# Patient Record
Sex: Female | Born: 1955
Health system: Southern US, Community
[De-identification: ages and names within clinical notes are randomized; demographics above are authoritative.]

## PROBLEM LIST (undated history)

## (undated) DIAGNOSIS — E119 Type 2 diabetes mellitus without complications: Secondary | ICD-10-CM

## (undated) DIAGNOSIS — Z9889 Other specified postprocedural states: Secondary | ICD-10-CM

## (undated) DIAGNOSIS — M2550 Pain in unspecified joint: Secondary | ICD-10-CM

## (undated) DIAGNOSIS — R5383 Other fatigue: Secondary | ICD-10-CM

## (undated) DIAGNOSIS — R112 Nausea with vomiting, unspecified: Secondary | ICD-10-CM

## (undated) DIAGNOSIS — E0590 Thyrotoxicosis, unspecified without thyrotoxic crisis or storm: Secondary | ICD-10-CM

## (undated) DIAGNOSIS — M1990 Unspecified osteoarthritis, unspecified site: Secondary | ICD-10-CM

## (undated) DIAGNOSIS — Z872 Personal history of diseases of the skin and subcutaneous tissue: Secondary | ICD-10-CM

## (undated) DIAGNOSIS — K219 Gastro-esophageal reflux disease without esophagitis: Secondary | ICD-10-CM

## (undated) DIAGNOSIS — I10 Essential (primary) hypertension: Secondary | ICD-10-CM

## (undated) HISTORY — PX: ROTATOR CUFF REPAIR: SHX139

## (undated) HISTORY — DX: Unspecified osteoarthritis, unspecified site: M19.90

## (undated) HISTORY — DX: Other fatigue: R53.83

## (undated) HISTORY — PX: TUBAL LIGATION: SHX77

## (undated) HISTORY — DX: Thyrotoxicosis, unspecified without thyrotoxic crisis or storm: E05.90

## (undated) HISTORY — DX: Personal history of diseases of the skin and subcutaneous tissue: Z87.2

## (undated) HISTORY — DX: Pain in unspecified joint: M25.50

---

## 2004-07-03 ENCOUNTER — Emergency Department: Payer: Self-pay | Admitting: Emergency Medicine

## 2004-07-23 ENCOUNTER — Ambulatory Visit: Payer: Self-pay

## 2005-01-01 ENCOUNTER — Emergency Department: Payer: Self-pay | Admitting: Emergency Medicine

## 2007-06-26 ENCOUNTER — Ambulatory Visit: Payer: Self-pay | Admitting: Family Medicine

## 2007-11-20 ENCOUNTER — Ambulatory Visit: Payer: Self-pay | Admitting: Family Medicine

## 2008-09-19 ENCOUNTER — Ambulatory Visit: Payer: Self-pay | Admitting: Family Medicine

## 2011-11-08 LAB — HM PAP SMEAR: HM PAP: NEGATIVE

## 2012-06-05 ENCOUNTER — Emergency Department: Payer: Self-pay | Admitting: Emergency Medicine

## 2013-12-01 ENCOUNTER — Emergency Department: Payer: Self-pay | Admitting: Emergency Medicine

## 2013-12-01 LAB — BASIC METABOLIC PANEL
ANION GAP: 4 — AB (ref 7–16)
BUN: 12 mg/dL (ref 7–18)
CALCIUM: 8.7 mg/dL (ref 8.5–10.1)
CHLORIDE: 108 mmol/L — AB (ref 98–107)
CO2: 28 mmol/L (ref 21–32)
Creatinine: 0.82 mg/dL (ref 0.60–1.30)
EGFR (African American): >60
EGFR (Non-African Amer.): >60
Glucose: 126 mg/dL — ABNORMAL HIGH (ref 65–99)
Osmolality: 281 (ref 275–301)
Potassium: 3.3 mmol/L — ABNORMAL LOW (ref 3.5–5.1)
SODIUM: 140 mmol/L (ref 136–145)

## 2013-12-01 LAB — CBC WITH DIFFERENTIAL/PLATELET
BASOS PCT: 0.9 %
Basophil #: 0.1 10*3/uL (ref 0.0–0.1)
EOS PCT: 2 %
Eosinophil #: 0.2 10*3/uL (ref 0.0–0.7)
HCT: 40.4 % (ref 35.0–47.0)
HGB: 13.8 g/dL (ref 12.0–16.0)
Lymphocyte #: 3 10*3/uL (ref 1.0–3.6)
Lymphocyte %: 28.2 %
MCH: 32.1 pg (ref 26.0–34.0)
MCHC: 34.1 g/dL (ref 32.0–36.0)
MCV: 94 fL (ref 80–100)
MONO ABS: 0.8 x10 3/mm (ref 0.2–0.9)
Monocyte %: 7.6 %
NEUTROS ABS: 6.6 10*3/uL — AB (ref 1.4–6.5)
Neutrophil %: 61.3 %
Platelet: 373 10*3/uL (ref 150–440)
RBC: 4.29 10*6/uL (ref 3.80–5.20)
RDW: 12.6 % (ref 11.5–14.5)
WBC: 10.8 10*3/uL (ref 3.6–11.0)

## 2013-12-01 LAB — URINALYSIS, COMPLETE
BACTERIA: NONE SEEN
BILIRUBIN, UR: NEGATIVE
Blood: NEGATIVE
GLUCOSE, UR: NEGATIVE mg/dL (ref 0–75)
KETONE: NEGATIVE
Leukocyte Esterase: NEGATIVE
Nitrite: NEGATIVE
PROTEIN: NEGATIVE
Ph: 5 (ref 4.5–8.0)
Specific Gravity: 1.011 (ref 1.003–1.030)
Squamous Epithelial: NONE SEEN
WBC UR: 3 /HPF (ref 0–5)

## 2013-12-01 LAB — TROPONIN I: Troponin-I: <0.02 ng/mL

## 2014-09-05 ENCOUNTER — Ambulatory Visit: Payer: Self-pay | Admitting: Specialist

## 2015-04-09 ENCOUNTER — Telehealth: Payer: Self-pay | Admitting: Family Medicine

## 2015-04-09 ENCOUNTER — Encounter: Payer: Self-pay | Admitting: Family Medicine

## 2015-04-09 ENCOUNTER — Ambulatory Visit: Payer: Self-pay | Admitting: Family Medicine

## 2015-04-09 ENCOUNTER — Ambulatory Visit (INDEPENDENT_AMBULATORY_CARE_PROVIDER_SITE_OTHER): Payer: BLUE CROSS/BLUE SHIELD | Admitting: Family Medicine

## 2015-04-09 VITALS — BP 124/78 | HR 64 | Temp 98.3°F | Resp 16 | Ht 65.0 in | Wt 196.0 lb

## 2015-04-09 DIAGNOSIS — M1990 Unspecified osteoarthritis, unspecified site: Secondary | ICD-10-CM | POA: Insufficient documentation

## 2015-04-09 DIAGNOSIS — I10 Essential (primary) hypertension: Secondary | ICD-10-CM | POA: Insufficient documentation

## 2015-04-09 DIAGNOSIS — R5383 Other fatigue: Secondary | ICD-10-CM | POA: Diagnosis not present

## 2015-04-09 DIAGNOSIS — I152 Hypertension secondary to endocrine disorders: Secondary | ICD-10-CM | POA: Insufficient documentation

## 2015-04-09 DIAGNOSIS — R609 Edema, unspecified: Secondary | ICD-10-CM | POA: Insufficient documentation

## 2015-04-09 DIAGNOSIS — Z872 Personal history of diseases of the skin and subcutaneous tissue: Secondary | ICD-10-CM | POA: Insufficient documentation

## 2015-04-09 DIAGNOSIS — G8929 Other chronic pain: Secondary | ICD-10-CM

## 2015-04-09 DIAGNOSIS — E0590 Thyrotoxicosis, unspecified without thyrotoxic crisis or storm: Secondary | ICD-10-CM | POA: Insufficient documentation

## 2015-04-09 DIAGNOSIS — E1159 Type 2 diabetes mellitus with other circulatory complications: Secondary | ICD-10-CM | POA: Insufficient documentation

## 2015-04-09 MED ORDER — VENLAFAXINE HCL ER 75 MG PO CP24: 75.0000 mg | ORAL_CAPSULE | Freq: Every day | ORAL | Status: DC

## 2015-04-09 NOTE — Progress Notes (Signed)
Subjective:    Patient ID: [name], female    DOB: 24-May-1956, 59 y.o.   MRN: [id number]  Muscle Pain The current episode started more than 1 year ago. The problem has been gradually worsening since onset. Associated with: pt's job, does a lot of repetive motion.  The pain is present in the neck, left shoulder, right shoulder, right elbow, left elbow and lower back. The pain is severe. Associated symptoms include fatigue, nausea, stiffness and weakness. Pertinent negatives include no abdominal pain, chest pain, constipation, diarrhea, dysuria, eye pain, fever, headaches, joint swelling, rash, shortness of breath, urinary symptoms, vaginal discharge, visual change, vomiting or wheezing. Treatments tried: [employer]. The treatment provided mild relief.  Back Pain The current episode started more than 1 month ago. The problem has been gradually worsening since onset. The pain is present in the lumbar spine. The pain radiates to the right thigh. The pain is at a severity of 10/10. The pain is severe. The pain is the same all the time. The symptoms are aggravated by standing. Associated symptoms include leg pain and weakness. Pertinent negatives include no abdominal pain, chest pain, dysuria, fever, headaches, numbness or paresthesias.      Review of Systems  Constitutional: Positive for fatigue. Negative for fever.  HENT:       Eye Itching is Present  Eyes: Negative for pain.  Respiratory: Negative for shortness of breath and wheezing.   Cardiovascular: Negative for chest pain.  Gastrointestinal: Positive for nausea. Negative for vomiting, abdominal pain, diarrhea and constipation.  Genitourinary: Negative for dysuria and vaginal discharge.  Musculoskeletal: Positive for back pain and stiffness. Negative for joint swelling.  Skin: Negative for rash.  Neurological: Positive for weakness. Negative for numbness, headaches and paresthesias.   BP 124/78 mmHg  Pulse 64  Temp(Src) 98.3 F  (36.8 C) (Oral)  Resp 16  Ht 5\' 5"  (1.651 m)  Wt 196 lb (88.905 kg)  BMI 32.62 kg/m2   Patient Active Problem List   Diagnosis Date Noted  . Arthritis sicca 04/09/2015  . Accumulation of fluid in tissues 04/09/2015  . Blood pressure elevated without history of HTN 04/09/2015  . H/O eczema 04/09/2015  . Hyperthyroidism 04/09/2015   No past medical history on file. No current outpatient prescriptions on file prior to visit.   No current facility-administered medications on file prior to visit.   No Known Allergies Past Surgical History  Procedure Laterality Date  . Rotator cuff repair Right   . Tubal ligation     History   Social History  . Marital Status: Married    Spouse Name: N/A  . Number of Children: 4  . Years of Education: GED   Occupational History  .  [employer]   Social History Main Topics  . Smoking status: Never Smoker   . Smokeless tobacco: Never Used  . Alcohol Use: Yes     Comment: Occasional  . Drug Use: No  . Sexual Activity: Not on file   Other Topics Concern  . Not on file   Social History Narrative   Family History  Problem Relation Age of Onset  . CAD Mother   . Throat cancer Mother   . Hypertension Father   . Lung cancer Father   . Diabetes Maternal Grandmother   . Diabetes Paternal Grandfather       Objective:   Physical Exam  Constitutional: She is oriented to person, place, and time. She appears well-developed and well-nourished.  Neurological: She is alert and oriented to person, place, and time. She has normal reflexes.  Psychiatric: She has a normal mood and affect. Her behavior is normal. Judgment and thought content normal.  Tearful regarding pain.     BP 124/78 mmHg  Pulse 64  Temp(Src) 98.3 F (36.8 C) (Oral)  Resp 16  Ht 5\' 5"  (1.651 m)  Wt 196 lb (88.905 kg)  BMI 32.62 kg/m2       Assessment & Plan:  1. Arthritis Will recheck labs.   - CBC with Differential/Platelet - B. Burgdorfi Antibodies -  ANA - Rheumatoid Arthritis Profile - Cyclic Citrul Peptide Antibody, IGG - Sedimentation rate - Comprehensive metabolic panel  2. Other fatigue Concerning for fibromyalgia.  Will check labs. Trial of Effexor. Further plan pending these results.  - TSH - venlafaxine XR (EFFEXOR-XR) 75 MG 24 hr capsule; Take 1 capsule (75 mg total) by mouth daily with breakfast.  Dispense: 30 capsule; Refill: 5  3. Chronic pain Worsening.  Will try medication as below. Use Ultram very sparingly.  Will stop Effexor if does not help. Recheck in 4 weeks.  - venlafaxine XR (EFFEXOR-XR) 75 MG 24 hr capsule; Take 1 capsule (75 mg total) by mouth daily with breakfast.  Dispense: 30 capsule; Refill: 5   Margarita Rana, MD

## 2015-04-09 NOTE — Telephone Encounter (Signed)
Pt is asking about taking the Rx traMADol (ULTRAM) 50 MG  And the Rx venlafaxine XR (EFFEXOR-XR) 75 MG together?  QN#[phone]/FF

## 2015-04-09 NOTE — Telephone Encounter (Signed)
Ok to use Ultram sparingly.

## 2015-04-10 ENCOUNTER — Emergency Department
Admission: EM | Admit: 2015-04-10 | Discharge: 2015-04-10 | Disposition: A | Discharge status: Home / Self Care | Payer: BLUE CROSS/BLUE SHIELD | Attending: Emergency Medicine | Admitting: Emergency Medicine

## 2015-04-10 ENCOUNTER — Other Ambulatory Visit: Payer: Self-pay

## 2015-04-10 ENCOUNTER — Telehealth: Payer: Self-pay

## 2015-04-10 ENCOUNTER — Encounter: Payer: Self-pay | Admitting: Emergency Medicine

## 2015-04-10 DIAGNOSIS — Z79899 Other long term (current) drug therapy: Secondary | ICD-10-CM | POA: Insufficient documentation

## 2015-04-10 DIAGNOSIS — T44905A Adverse effect of unspecified drugs primarily affecting the autonomic nervous system, initial encounter: Secondary | ICD-10-CM | POA: Diagnosis not present

## 2015-04-10 DIAGNOSIS — Y9389 Activity, other specified: Secondary | ICD-10-CM | POA: Insufficient documentation

## 2015-04-10 DIAGNOSIS — Y998 Other external cause status: Secondary | ICD-10-CM | POA: Insufficient documentation

## 2015-04-10 DIAGNOSIS — R002 Palpitations: Secondary | ICD-10-CM | POA: Insufficient documentation

## 2015-04-10 DIAGNOSIS — T7840XA Allergy, unspecified, initial encounter: Secondary | ICD-10-CM | POA: Diagnosis present

## 2015-04-10 DIAGNOSIS — M1990 Unspecified osteoarthritis, unspecified site: Secondary | ICD-10-CM

## 2015-04-10 DIAGNOSIS — X58XXXA Exposure to other specified factors, initial encounter: Secondary | ICD-10-CM | POA: Diagnosis not present

## 2015-04-10 DIAGNOSIS — Y9289 Other specified places as the place of occurrence of the external cause: Secondary | ICD-10-CM | POA: Diagnosis not present

## 2015-04-10 DIAGNOSIS — T50905A Adverse effect of unspecified drugs, medicaments and biological substances, initial encounter: Secondary | ICD-10-CM

## 2015-04-10 DIAGNOSIS — M2550 Pain in unspecified joint: Secondary | ICD-10-CM

## 2015-04-10 LAB — BASIC METABOLIC PANEL
ANION GAP: 7 (ref 5–15)
BUN: 18 mg/dL (ref 6–20)
CHLORIDE: 106 mmol/L (ref 101–111)
CO2: 28 mmol/L (ref 22–32)
CREATININE: 0.76 mg/dL (ref 0.44–1.00)
Calcium: 9.4 mg/dL (ref 8.9–10.3)
Glucose, Bld: 175 mg/dL — ABNORMAL HIGH (ref 65–99)
Potassium: 3.3 mmol/L — ABNORMAL LOW (ref 3.5–5.1)
Sodium: 141 mmol/L (ref 135–145)

## 2015-04-10 LAB — COMPREHENSIVE METABOLIC PANEL
ALT: 28 IU/L (ref 0–32)
AST: 28 IU/L (ref 0–40)
Albumin/Globulin Ratio: 1.9 (ref 1.1–2.5)
Albumin: 4.6 g/dL (ref 3.5–5.5)
Alkaline Phosphatase: 78 IU/L (ref 39–117)
BUN/Creatinine Ratio: 18 (ref 9–23)
BUN: 13 mg/dL (ref 6–24)
Bilirubin Total: 0.2 mg/dL (ref 0.0–1.2)
CALCIUM: 9.5 mg/dL (ref 8.7–10.2)
CO2: 25 mmol/L (ref 18–29)
Chloride: 102 mmol/L (ref 97–108)
Creatinine, Ser: 0.71 mg/dL (ref 0.57–1.00)
GFR calc Af Amer: 109 mL/min/{1.73_m2} (ref >59)
GFR, EST NON AFRICAN AMERICAN: 94 mL/min/{1.73_m2} (ref >59)
GLUCOSE: 96 mg/dL (ref 65–99)
Globulin, Total: 2.4 g/dL (ref 1.5–4.5)
Potassium: 4.4 mmol/L (ref 3.5–5.2)
SODIUM: 143 mmol/L (ref 134–144)
Total Protein: 7 g/dL (ref 6.0–8.5)

## 2015-04-10 LAB — CBC WITH DIFFERENTIAL/PLATELET
BASOS ABS: 0.1 10*3/uL (ref 0.0–0.2)
BASOS: 1 %
EOS (ABSOLUTE): 0.3 10*3/uL (ref 0.0–0.4)
Eos: 3 %
HEMATOCRIT: 40.3 % (ref 34.0–46.6)
HEMOGLOBIN: 13.6 g/dL (ref 11.1–15.9)
IMMATURE GRANULOCYTES: 0 %
Immature Grans (Abs): 0 10*3/uL (ref 0.0–0.1)
Lymphocytes Absolute: 3.3 10*3/uL — ABNORMAL HIGH (ref 0.7–3.1)
Lymphs: 36 %
MCH: 30.7 pg (ref 26.6–33.0)
MCHC: 33.7 g/dL (ref 31.5–35.7)
MCV: 91 fL (ref 79–97)
Monocytes Absolute: 0.8 10*3/uL (ref 0.1–0.9)
Monocytes: 8 %
Neutrophils Absolute: 4.9 10*3/uL (ref 1.4–7.0)
Neutrophils: 52 %
PLATELETS: 392 10*3/uL — AB (ref 150–379)
RBC: 4.43 x10E6/uL (ref 3.77–5.28)
RDW: 13.5 % (ref 12.3–15.4)
WBC: 9.3 10*3/uL (ref 3.4–10.8)

## 2015-04-10 LAB — CBC
HCT: 41.1 % (ref 35.0–47.0)
Hemoglobin: 13.6 g/dL (ref 12.0–16.0)
MCH: 31 pg (ref 26.0–34.0)
MCHC: 33.2 g/dL (ref 32.0–36.0)
MCV: 93.4 fL (ref 80.0–100.0)
PLATELETS: 348 10*3/uL (ref 150–440)
RBC: 4.4 MIL/uL (ref 3.80–5.20)
RDW: 13 % (ref 11.5–14.5)
WBC: 9.7 10*3/uL (ref 3.6–11.0)

## 2015-04-10 LAB — TSH: TSH: 4.7 u[IU]/mL — AB (ref 0.450–4.500)

## 2015-04-10 LAB — TROPONIN I: Troponin I: <0.03 ng/mL (ref <0.031)

## 2015-04-10 LAB — B. BURGDORFI ANTIBODIES

## 2015-04-10 LAB — MAGNESIUM: MAGNESIUM: 2 mg/dL (ref 1.7–2.4)

## 2015-04-10 LAB — SEDIMENTATION RATE: SED RATE: 2 mm/h (ref 0–40)

## 2015-04-10 LAB — ANA: Anti Nuclear Antibody(ANA): NEGATIVE

## 2015-04-10 MED ORDER — ONDANSETRON HCL 4 MG/2ML IJ SOLN: 4.0000 mg | INTRAMUSCULAR | Status: DC

## 2015-04-10 MED ORDER — SODIUM CHLORIDE 0.9 % IV BOLUS (SEPSIS)
1000.0000 mL | Freq: Once | INTRAVENOUS | Status: AC
  Administered 2015-04-10: 1000 mL via INTRAVENOUS

## 2015-04-10 MED ORDER — LORAZEPAM 2 MG/ML IJ SOLN
1.0000 mg | Freq: Once | INTRAMUSCULAR | Status: AC
  Administered 2015-04-10: 1 mg via INTRAVENOUS
  Filled 2015-04-10: qty 1

## 2015-04-10 MED ORDER — LORAZEPAM 2 MG/ML IJ SOLN: 1.0000 mg | Freq: Once | INTRAMUSCULAR | Status: DC

## 2015-04-10 MED ORDER — POTASSIUM CHLORIDE CRYS ER 20 MEQ PO TBCR
40.0000 meq | EXTENDED_RELEASE_TABLET | Freq: Once | ORAL | Status: AC
Start: 2015-04-10 — End: 2015-04-10
  Administered 2015-04-10: 40 meq via ORAL
  Filled 2015-04-10: qty 2

## 2015-04-10 NOTE — Telephone Encounter (Signed)
Pt advised as directed below. She would like to proceed with the referral to Dr. Sharlet Salina.   Thanks,   -Mickel Baas

## 2015-04-10 NOTE — Discharge Instructions (Signed)
Drug Allergy  Please do not take venlafaxine ever again and take great caution if your doctor considers prescribing UA medication in the class of selective serotonin and norepinephrine reuptake inhibitors.  Please follow-up with Dr. Venia Minks tomorrow. Return to the emergency room right away should your symptoms come back, you have a fever, weakness, pass out, get abdominal pain, experience chest pain, or other new concerns arise.    A drug allergy means you have a strange reaction to a medicine. You may have puffiness (swelling), itching, red rashes, and hives. Some allergic reactions can be life-threatening. HOME CARE  If you do not know what caused your reaction:  Write down medicines you use.  Write down any problems you have after using medicine.  Avoid things that cause a reaction.  You can see an allergy doctor to be tested for allergies. If you have hives or a rash:  Take medicine as told by your doctor.  Place cold cloths on your skin.  Do not take hot baths or hot showers. Take baths in cool water. If you are severely allergic:  Wear a medical bracelet or necklace that lists your allergy.  Carry your allergy kit or medicine shot to treat severe allergic reactions with you. These can save your life.  Do not drive until medicine from your shot has worn off, unless your doctor says it is okay. GET HELP RIGHT AWAY IF:   Your mouth is puffy, or you have trouble breathing.  You have a tight feeling in your chest or throat.  You have hives, puffiness, or itching all over your body.  You throw up (vomit) or have watery poop (diarrhea).  You feel dizzy or pass out (faint).  You think you are having a reaction. Problems often start within 30 minutes after taking a medicine.  You are getting worse, not better.  You have new problems.  Your problems go away and then come back. This is an emergency. Use your medicine shot or allergy kit as told. Call yourlocal emergency  services (911 in [place] after the shot. Even if you feel better after the shot, you need to go to the hospital. You may need more medicine to control a severe reaction. MAKE SURE YOU:  Understand these instructions.  Will watch your condition.  Will get help right away if you are not doing well or get worse. Document Released: 10/07/2004 Document Revised: 11/22/2011 Document Reviewed: 02/25/2011 [facility] Patient Information 2015 [place], [place]. This information is not intended to replace advice given to you by your health care provider. Make sure you discuss any questions you have with your health care provider.

## 2015-04-10 NOTE — Telephone Encounter (Signed)
-----   Message from Margarita Rana, MD sent at 04/10/2015  1:43 PM EDT ----- Lyme titer and ANA negative. Can refer to Dr. Sharlet Salina for further evaluation of pain if patient would like. Thanks.

## 2015-04-10 NOTE — ED Provider Notes (Signed)
[facility] Emergency Department Provider Note  ____________________________________________  Time seen: Approximately 12:30 AM  I have reviewed the triage vital signs and the nursing notes.   HISTORY  Chief Complaint Allergic Reaction    HPI Molly Potter is a 59 y.o. female comes in after waking up around 11 PM feeling very shaky, tremulous, having a pounding headache, sweats, and a racing heart. She states she believes she is having allergic reaction to a new medicine which she took just prior to falling asleep at about 3 PM. She states that she's been having some chronic muscle aches across her upper back and shoulders  She denies any fevers or chills. She was sweaty earlier. No recent illness. No abdominal pain. She has chronic pain across her shoulders and back muscles. No difficulty with urination. She is not pregnant.  History reviewed. No pertinent past medical history.  Patient Active Problem List   Diagnosis Date Noted  . Arthritis sicca 04/09/2015  . Accumulation of fluid in tissues 04/09/2015  . Blood pressure elevated without history of HTN 04/09/2015  . H/O eczema 04/09/2015  . Hyperthyroidism 04/09/2015  . Arthritis 04/09/2015  . Fatigue 04/09/2015    Past Surgical History  Procedure Laterality Date  . Rotator cuff repair Right   . Tubal ligation      Current Outpatient Rx  Name  Route  Sig  Dispense  Refill  . traMADol (ULTRAM) 50 MG tablet   Oral   Take by mouth.         . venlafaxine XR (EFFEXOR-XR) 75 MG 24 hr capsule   Oral   Take 1 capsule (75 mg total) by mouth daily with breakfast.   30 capsule   5   . acetaminophen (TYLENOL) 500 MG tablet   Oral   Take 1,000 mg by mouth every 6 (six) hours as needed.         . diphenhydrAMINE (SOMINEX) 25 MG tablet   Oral   Take 25 mg by mouth at bedtime as needed for sleep.         . meloxicam (MOBIC) 15 MG tablet            1   . ranitidine (ZANTAC) 150 MG  tablet   Oral   Take by mouth.           Allergies Review of patient's allergies indicates no known allergies.  Family History  Problem Relation Age of Onset  . CAD Mother   . Throat cancer Mother   . Hypertension Father   . Lung cancer Father   . Diabetes Maternal Grandmother   . Diabetes Paternal Grandfather     Social History History  Substance Use Topics  . Smoking status: Never Smoker   . Smokeless tobacco: Never Used  . Alcohol Use: Yes     Comment: Occasional    Review of Systems Constitutional: No fever/chills Eyes: No visual changes. ENT: No sore throat. Cardiovascular: Denies chest pain. Asked twice, definitely no chest pain. Respiratory: Denies shortness of breath. Gastrointestinal: No abdominal pain.    No diarrhea.  No constipation. Genitourinary: Negative for dysuria. Musculoskeletal: Negative for back pain. Skin: Negative for rash. Neurological: Negative for headaches, focal weakness or numbness.  No thoughts of hurting herself. No hallucinations.  10-point ROS otherwise negative.  ____________________________________________   PHYSICAL EXAM:  VITAL SIGNS: ED Triage Vitals  Enc Vitals Group     BP 04/10/15 0009 183/97 mmHg     Pulse Rate 04/10/15  0009 142     Resp 04/10/15 0009 22     Temp 04/10/15 0009 97.8 F (36.6 C)     Temp Source 04/10/15 0009 Oral     SpO2 04/10/15 0009 97 %     Weight --      Height --      Head Cir --      Peak Flow --      Pain Score 04/10/15 0009 10     Pain Loc --      Pain Edu? --      Excl. in [place]? --     Constitutional: Alert and oriented. Well appearing slightly anxious Eyes: Conjunctivae are normal. PERRL. EOMI. Head: Atraumatic. Nose: No congestion/rhinnorhea. Mouth/Throat: Mucous membranes are slightly dry.  Oropharynx non-erythematous. Neck: No stridor.   Cardiovascular: Tachycardia regular rhythm. Grossly normal heart sounds.  Good peripheral circulation. Respiratory: Normal respiratory  effort.  No retractions. Lungs CTAB. Gastrointestinal: Soft and nontender. No distention. No abdominal bruits. No CVA tenderness. Musculoskeletal: No lower extremity tenderness nor edema.  No joint effusions. Neurologic:  Normal speech and language. No gross focal neurologic deficits are appreciated. No gait instability. Normal reflexes. No facial droop. Moves all extremities well. No clonus. Skin:  Skin is warm, dry slightly cool having recently been diaphoretic. No rash noted. Psychiatric: Mood and affect are normal. Speech and behavior are normal.  ____________________________________________   LABS (all labs ordered are listed, but only abnormal results are displayed)  Labs Reviewed  BASIC METABOLIC PANEL - Abnormal; Notable for the following:    Potassium 3.3 (*)    Glucose, Bld 175 (*)    All other components within normal limits  CBC  MAGNESIUM  TROPONIN I   ____________________________________________  EKG  Reviewed and interpreted by me Sinus tachycardia at a rate of 135 PR 134 QRS 84 QTc 475 No significant ST abnormalities noted.  Interpreted as sinus tachycardia with slightly prolonged QT. ____________________________________________  RADIOLOGY   ____________________________________________   PROCEDURES  Procedure(s) performed: None  Critical Care performed: No  ____________________________________________   INITIAL IMPRESSION / ASSESSMENT AND PLAN / ED COURSE  Pertinent labs & imaging results that were available during my care of the patient were reviewed by me and considered in my medical decision making (see chart for details).  Patient present with symptoms of hyperactivity potentially sympathomimetics-type toxidrome versus anticholinergic. She denies any acute infectious symptoms and not having any concerning symptoms like chest pain. This is almost setting of recently taking venlafaxine xr. I reviewed and discussed with CJ at poison control and  appears consistent with venlafaxine adverse reaction. We will treat her with conservative management and supportive care. Poison control recommends observation until all symptoms have improved, once improved the patient could be discharged home to not ever take venlafaxine again, which she acknowledges that she will never take. Other considerations would be acute metabolic abnormalities or infectious etiologies, but she is not febrile and denies any infectious symptoms. Her abdominal exam is really reassuring. I suspect this is likely a drug related side effect.  ----------------------------------------- 1:54 AM on 04/10/2015 -----------------------------------------  Patient reports asymptomatic now. Nausea tremulousness and anxiety are gone. No symptoms. Vital signs are much improved. Potassium slightly low and we will replete this. I discussed with the patient that we will continue to observe her briefly and obtain a second EKG. Should she continue to feel well and her vital signs remained stable, which I anticipate they will we'll plan to discharge her to home. Careful  return precautions advised as well as to never retake venlafaxine or medications of that similar class, patient very agreeable and will contact her doctor tomorrow to discuss.  ----------------------------------------- 2:28 AM on 04/10/2015 -----------------------------------------  Patient remains asymptomatic. Repeat EKG done at 2:15 AM.  ED ECG REPORT I, QUALE, MARK, the attending physician, personally viewed and interpreted this ECG.  Date: 04/10/2015 EKG Time: 2:15 Rate: 85 Rhythm: normal sinus rhythm QRS Axis: normal Intervals: normal ST/T Wave abnormalities: normal Conduction Disutrbances: none Narrative Interpretation: unremarkable, QTC has normalized  Careful return precautions discussed with the patient. She'll follow-up with her doctor tomorrow.   ____________________________________________   FINAL  CLINICAL IMPRESSION(S) / ED DIAGNOSES  Final diagnoses:  Medication side effect, initial encounter  Sympathomimetic adverse reaction, initial encounter      Delman Kitten, MD 04/10/15 [phone]

## 2015-04-10 NOTE — ED Notes (Addendum)
Patient ambulatory to triage with steady gait, without difficulty or distress noted; pt reports taking antidepressant today and now "feel like I'm on fire on the inside, like my head is gonna explode"; pt tachycardic, diaphoretic and actively vomiting

## 2015-04-11 LAB — RHEUMATOID ARTHRITIS PROFILE: Cyclic Citrullin Peptide Ab: 13 units (ref 0–19)

## 2015-05-07 ENCOUNTER — Ambulatory Visit: Payer: BLUE CROSS/BLUE SHIELD | Admitting: Family Medicine

## 2015-05-28 ENCOUNTER — Ambulatory Visit (INDEPENDENT_AMBULATORY_CARE_PROVIDER_SITE_OTHER): Payer: BLUE CROSS/BLUE SHIELD | Admitting: Family Medicine

## 2015-05-28 ENCOUNTER — Encounter: Payer: Self-pay | Admitting: Family Medicine

## 2015-05-28 VITALS — BP 118/78 | HR 71 | Temp 98.2°F | Resp 16 | Ht 65.0 in | Wt 200.0 lb

## 2015-05-28 DIAGNOSIS — R002 Palpitations: Secondary | ICD-10-CM

## 2015-05-28 NOTE — Progress Notes (Signed)
Subjective:     Patient ID: Molly Potter, female   DOB: [birth year], 59 y.o.   MRN: [id number]  HPI  Chief Complaint  Patient presents with  . Palpitations    Patient comes in office today with concerns of heart palpitations, patient states "my heart feels like its gonna come out of my chest. Patient reports that palpitations began a few days ago, she states that she just finished a round of steroids and has had little to no sleep and is unsure if it is related  Completed a 6 day course of prednisone per pain management on 9/11. During this taper she developed episodes of forceful palpitations several x day for minutes at a time. No associated chest pain. Had previous ER visit in July for palpitations attributed to a medication. Reports minimal use of caffeine (one drink daily). Hx of hyperthyroidism with recent mild TSH elevation.   Review of Systems  Psychiatric/Behavioral:       Denies panic attacks.       Objective:   Physical Exam  Constitutional: She appears well-developed and well-nourished. No distress.  Cardiovascular: Normal rate and regular rhythm.   No murmur heard. Pulmonary/Chest: Effort normal and breath sounds normal.  Musculoskeletal: She exhibits no edema (of lower extremties).       Assessment:    1. Heart palpitations - EKG 12-Lead: NSR - Renal function panel - T4, free - TSH    Plan:    Further f/u pending lab results. Consider Holter monitor if palpitations do not abate spontaneously.

## 2015-05-28 NOTE — Patient Instructions (Addendum)
We will call you with the lab results. Minimize caffeine intake.

## 2015-05-29 ENCOUNTER — Telehealth: Payer: Self-pay | Admitting: Family Medicine

## 2015-05-29 LAB — RENAL FUNCTION PANEL
Albumin: 4.1 g/dL (ref 3.5–5.5)
BUN/Creatinine Ratio: 16 (ref 9–23)
BUN: 12 mg/dL (ref 6–24)
CO2: 27 mmol/L (ref 18–29)
Calcium: 9.6 mg/dL (ref 8.7–10.2)
Chloride: 102 mmol/L (ref 97–108)
Creatinine, Ser: 0.76 mg/dL (ref 0.57–1.00)
GFR, EST AFRICAN AMERICAN: 100 mL/min/{1.73_m2} (ref >59)
GFR, EST NON AFRICAN AMERICAN: 87 mL/min/{1.73_m2} (ref >59)
GLUCOSE: 77 mg/dL (ref 65–99)
PHOSPHORUS: 3.9 mg/dL (ref 2.5–4.5)
POTASSIUM: 4.4 mmol/L (ref 3.5–5.2)
SODIUM: 145 mmol/L — AB (ref 134–144)

## 2015-05-29 LAB — TSH: TSH: 3.41 u[IU]/mL (ref 0.450–4.500)

## 2015-05-29 LAB — T4, FREE: Free T4: 1.1 ng/dL (ref 0.82–1.77)

## 2015-05-29 NOTE — Telephone Encounter (Signed)
Labs are normal. I have discussed her case with Dr. Venia Minks. If still having palpitations we will send her to a cardiologist.

## 2015-05-29 NOTE — Telephone Encounter (Signed)
Did you call patient about her lab results?

## 2015-05-29 NOTE — Telephone Encounter (Signed)
Pt returned Bob's call regarding her lab work.  Thanks [employer]

## 2015-07-02 DIAGNOSIS — M7542 Impingement syndrome of left shoulder: Secondary | ICD-10-CM

## 2015-07-02 DIAGNOSIS — M7541 Impingement syndrome of right shoulder: Secondary | ICD-10-CM | POA: Insufficient documentation

## 2015-07-02 DIAGNOSIS — M5412 Radiculopathy, cervical region: Secondary | ICD-10-CM | POA: Insufficient documentation

## 2015-07-02 DIAGNOSIS — M75122 Complete rotator cuff tear or rupture of left shoulder, not specified as traumatic: Secondary | ICD-10-CM | POA: Insufficient documentation

## 2015-07-02 DIAGNOSIS — M7540 Impingement syndrome of unspecified shoulder: Secondary | ICD-10-CM | POA: Insufficient documentation

## 2015-11-26 ENCOUNTER — Encounter: Payer: Self-pay | Admitting: Physician Assistant

## 2015-11-26 ENCOUNTER — Ambulatory Visit (INDEPENDENT_AMBULATORY_CARE_PROVIDER_SITE_OTHER): Payer: 59 | Admitting: Physician Assistant

## 2015-11-26 VITALS — BP 136/76 | HR 87 | Temp 99.1°F | Resp 16 | Wt 200.4 lb

## 2015-11-26 DIAGNOSIS — K2100 Gastro-esophageal reflux disease with esophagitis, without bleeding: Secondary | ICD-10-CM

## 2015-11-26 DIAGNOSIS — J0140 Acute pansinusitis, unspecified: Secondary | ICD-10-CM | POA: Diagnosis not present

## 2015-11-26 MED ORDER — AMOXICILLIN-POT CLAVULANATE 875-125 MG PO TABS: 1.0000 | ORAL_TABLET | Freq: Two times a day (BID) | ORAL | Status: DC

## 2015-11-26 MED ORDER — ESOMEPRAZOLE MAGNESIUM 20 MG PO CPDR: 20.0000 mg | DELAYED_RELEASE_CAPSULE | Freq: Every day | ORAL | Status: DC

## 2015-11-26 NOTE — Patient Instructions (Signed)
Esomeprazole capsules What is this medicine? ESOMEPRAZOLE (es oh ME pray zol) prevents the production of acid in the stomach. It is used to treat gastroesophageal reflux disease (GERD), ulcers, certain bacteria in the stomach, and inflammation of the esophagus. It can also be used to prevent ulcers in patients taking medicines called NSAIDs. You can also buy this medicine without a prescription to treat the symptoms of heartburn. The non-prescription product is not for long-term use, unless otherwise directed by your doctor or health care professional. This medicine may be used for other purposes; ask your health care provider or pharmacist if you have questions. What should I tell my health care provider before I take this medicine? They need to know if you have any of these conditions: -bloody or black, tarry stools -chest pain -have had heartburn for over 3 months -have heartburn with dizziness, lightheadedness, or sweating -liver disease -low levels of magnesium in the blood -nausea, vomiting -stomach pain -trouble swallowing -unexplained weight loss -vomiting with blood -wheezing -an unusual or allergic reaction to esomeprazole, other medicines, foods, dyes, or preservatives -pregnant or trying to get pregnant -breast-feeding How should I use this medicine? Take this medicine by mouth. Swallow the capsules whole with a drink of water. Follow the directions on the prescription or product label. Do not crush, break or chew. The capsules can be opened and the contents sprinkled on applesauce. Do not crush the contents into the food. This medicine works best if taken on an empty stomach at least one hour before a meal. Take your medicine at regular intervals. Do not take your medicine more often than directed. Talk to your pediatrician regarding the use of this medicine in children. Special care may be needed. Overdosage: If you think you have taken too much of this medicine contact a  poison control center or emergency room at once. NOTE: This medicine is only for you. Do not share this medicine with others. What if I miss a dose? If you miss a dose, take it as soon as you can. If it is almost time for your next dose, take only that dose. Do not take double or extra doses. What may interact with this medicine? Do not take this medicine with any of the following medications: -atazanavir This medicine may also interact with the following medications: -ampicillin -antiviral medicines for HIV or AIDS -certain medicines for fungal infections like ketoconazole and itraconazole -cilostazol -clopidogrel -diazepam -digoxin -erlotinib -diuretics -iron salts -methotrexate -St. John's Wort -tacrolimus -rifampin -warfarin This list may not describe all possible interactions. Give your health care provider a list of all the medicines, herbs, non-prescription drugs, or dietary supplements you use. Also tell them if you smoke, drink alcohol, or use illegal drugs. Some items may interact with your medicine. What should I watch for while using this medicine? If you are taking this medicine without a prescription, it may take 1 to 4 days for it to fully relieve your heartburn. If you are using this medicine with a prescription from your healthcare professional for a more serious condition, it can take several days before your condition gets better. Check with your doctor or health care professional if your condition does not start to get better, or if it gets worse. If you take this medicine for long periods of time, you may need blood work done. What side effects may I notice from receiving this medicine? Side effects that you should report to your doctor or health care professional as soon as possible: -  allergic reactions like skin rash, itching or hives, swelling of the face, lips, or tongue -bone, muscle or joint pain -breathing problems -chest pain or chest tightness -dark  yellow or brown urine -fast, irregular heartbeat -feeling faint or lightheaded -fever or sore throat -muscle spasms -tremors -unusual bleeding or bruising -unusually weak or tired -upset stomach -yellowing of the eyes or skin Side effects that usually do not require medical attention (Report these to your doctor or health care professional if they continue or are bothersome.): -constipation -diarrhea -dry mouth -headache -nausea -stomach pain or gas -vomiting This list may not describe all possible side effects. Call your doctor for medical advice about side effects. You may report side effects to FDA at 1-800-FDA-1088. Where should I keep my medicine? Keep out of the reach of children. Store at room temperature between 15 and 30 degrees C (59 and 86 degrees F). Protect from light and moisture. Throw away any unused medicine after the expiration date. NOTE: This sheet is a summary. It may not cover all possible information. If you have questions about this medicine, talk to your doctor, pharmacist, or health care provider.    2016, Elsevier/Gold Standard. (2012-12-12 14:58:20) Food Choices for Gastroesophageal Reflux Disease, Adult When you have gastroesophageal reflux disease (GERD), the foods you eat and your eating habits are very important. Choosing the right foods can help ease the discomfort of GERD. WHAT GENERAL GUIDELINES DO I NEED TO FOLLOW?  Choose fruits, vegetables, whole grains, low-fat dairy products, and low-fat meat, fish, and poultry.  Limit fats such as oils, salad dressings, butter, nuts, and avocado.  Keep a food diary to identify foods that cause symptoms.  Avoid foods that cause reflux. These may be different for different people.  Eat frequent small meals instead of three large meals each day.  Eat your meals slowly, in a relaxed setting.  Limit fried foods.  Cook foods using methods other than frying.  Avoid drinking alcohol.  Avoid drinking  large amounts of liquids with your meals.  Avoid bending over or lying down until 2-3 hours after eating. WHAT FOODS ARE NOT RECOMMENDED? The following are some foods and drinks that may worsen your symptoms: Vegetables Tomatoes. Tomato juice. Tomato and spaghetti sauce. Chili peppers. Onion and garlic. Horseradish. Fruits Oranges, grapefruit, and lemon (fruit and juice). Meats High-fat meats, fish, and poultry. This includes hot dogs, ribs, ham, sausage, salami, and bacon. Dairy Whole milk and chocolate milk. Sour cream. Cream. Butter. Ice cream. Cream cheese.  Beverages Coffee and tea, with or without caffeine. Carbonated beverages or energy drinks. Condiments Hot sauce. Barbecue sauce.  Sweets/Desserts Chocolate and cocoa. Donuts. Peppermint and spearmint. Fats and Oils High-fat foods, including [place] fries and potato chips. Other Vinegar. Strong spices, such as black pepper, white pepper, red pepper, cayenne, curry powder, cloves, ginger, and chili powder. The items listed above may not be a complete list of foods and beverages to avoid. Contact your dietitian for more information.   This information is not intended to replace advice given to you by your health care provider. Make sure you discuss any questions you have with your health care provider.   Document Released: 08/30/2005 Document Revised: 09/20/2014 Document Reviewed: 07/04/2013 Elsevier Interactive Patient Education 2016 [place]. Gastroesophageal Reflux Disease, Adult Normally, food travels down the esophagus and stays in the stomach to be digested. However, when a person has gastroesophageal reflux disease (GERD), food and stomach acid move back up into the esophagus. When this happens, the esophagus  becomes sore and inflamed. Over time, GERD can create small holes (ulcers) in the lining of the esophagus.  CAUSES This condition is caused by a problem with the muscle between the esophagus and the stomach (lower  esophageal sphincter, or LES). Normally, the LES muscle closes after food passes through the esophagus to the stomach. When the LES is weakened or abnormal, it does not close properly, and that allows food and stomach acid to go back up into the esophagus. The LES can be weakened by certain dietary substances, medicines, and medical conditions, including:  Tobacco use.  Pregnancy.  Having a hiatal hernia.  Heavy alcohol use.  Certain foods and beverages, such as coffee, chocolate, onions, and peppermint. RISK FACTORS This condition is more likely to develop in:  People who have an increased body weight.  People who have connective tissue disorders.  People who use NSAID medicines. SYMPTOMS Symptoms of this condition include:  Heartburn.  Difficult or painful swallowing.  The feeling of having a lump in the throat.  Abitter taste in the mouth.  Bad breath.  Having a large amount of saliva.  Having an upset or bloated stomach.  Belching.  Chest pain.  Shortness of breath or wheezing.  Ongoing (chronic) cough or a night-time cough.  Wearing away of tooth enamel.  Weight loss. Different conditions can cause chest pain. Make sure to see your health care provider if you experience chest pain. DIAGNOSIS Your health care provider will take a medical history and perform a physical exam. To determine if you have mild or severe GERD, your health care provider may also monitor how you respond to treatment. You may also have other tests, including:  An endoscopy toexamine your stomach and esophagus with a small camera.  A test thatmeasures the acidity level in your esophagus.  A test thatmeasures how much pressure is on your esophagus.  A barium swallow or modified barium swallow to show the shape, size, and functioning of your esophagus. TREATMENT The goal of treatment is to help relieve your symptoms and to prevent complications. Treatment for this condition may  vary depending on how severe your symptoms are. Your health care provider may recommend:  Changes to your diet.  Medicine.  Surgery. HOME CARE INSTRUCTIONS Diet  Follow a diet as recommended by your health care provider. This may involve avoiding foods and drinks such as:  Coffee and tea (with or without caffeine).  Drinks that containalcohol.  Energy drinks and sports drinks.  Carbonated drinks or sodas.  Chocolate and cocoa.  Peppermint and mint flavorings.  Garlic and onions.  Horseradish.  Spicy and acidic foods, including peppers, chili powder, curry powder, vinegar, hot sauces, and barbecue sauce.  Citrus fruit juices and citrus fruits, such as oranges, lemons, and limes.  Tomato-based foods, such as red sauce, chili, salsa, and pizza with red sauce.  Fried and fatty foods, such as donuts, french fries, potato chips, and high-fat dressings.  High-fat meats, such as hot dogs and fatty cuts of red and white meats, such as rib eye steak, sausage, ham, and bacon.  High-fat dairy items, such as whole milk, butter, and cream cheese.  Eat small, frequent meals instead of large meals.  Avoid drinking large amounts of liquid with your meals.  Avoid eating meals during the 2-3 hours before bedtime.  Avoid lying down right after you eat.  Do not exercise right after you eat. General Instructions  Pay attention to any changes in your symptoms.  Take over-the-counter  and prescription medicines only as told by your health care provider. Do not take aspirin, ibuprofen, or other NSAIDs unless your health care provider told you to do so.  Do not use any tobacco products, including cigarettes, chewing tobacco, and e-cigarettes. If you need help quitting, ask your health care provider.  Wear loose-fitting clothing. Do not wear anything tight around your waist that causes pressure on your abdomen.  Raise (elevate) the head of your bed 6 inches (15cm).  Try to reduce  your stress, such as with yoga or meditation. If you need help reducing stress, ask your health care provider.  If you are overweight, reduce your weight to an amount that is healthy for you. Ask your health care provider for guidance about a safe weight loss goal.  Keep all follow-up visits as told by your health care provider. This is important. SEEK MEDICAL CARE IF:  You have new symptoms.  You have unexplained weight loss.  You have difficulty swallowing, or it hurts to swallow.  You have wheezing or a persistent cough.  Your symptoms do not improve with treatment.  You have a hoarse voice. SEEK IMMEDIATE MEDICAL CARE IF:  You have pain in your arms, neck, jaw, teeth, or back.  You feel sweaty, dizzy, or light-headed.  You have chest pain or shortness of breath.  You vomit and your vomit looks like blood or coffee grounds.  You faint.  Your stool is bloody or black.  You cannot swallow, drink, or eat.   This information is not intended to replace advice given to you by your health care provider. Make sure you discuss any questions you have with your health care provider.   Document Released: 06/09/2005 Document Revised: 05/21/2015 Document Reviewed: 12/25/2014 Elsevier Interactive Patient Education [employer].

## 2015-11-26 NOTE — Progress Notes (Signed)
Subjective:     Patient ID: Molly Potter, female   DOB: 03/28/1956, 60 y.o.   MRN: [id number]  Sinus Problem This is a new problem. The current episode started 1 to 4 weeks ago. The problem is unchanged. There has been no fever. Her pain is at a severity of 5/10. The pain is mild. Associated symptoms include coughing, headaches, a hoarse voice, neck pain (right side), sinus pressure and sneezing. Pertinent negatives include no chills, congestion, diaphoresis, ear pain, shortness of breath, sore throat or swollen glands. Past treatments include acetaminophen. The treatment provided no relief.  Abdominal Pain This is a new problem. The current episode started in the past 7 days. The onset quality is sudden. The problem occurs daily. The problem has been unchanged. The pain is located in the epigastric region. The quality of the pain is burning. The abdominal pain does not radiate. Associated symptoms include belching, flatus and headaches. Pertinent negatives include no diarrhea, dysuria, nausea, vomiting or weight loss. The pain is aggravated by eating. The pain is relieved by passing flatus. She has tried antacids for the symptoms. The treatment provided moderate relief.     Review of Systems  Constitutional: Negative for chills, weight loss and diaphoresis.  HENT: Positive for hoarse voice, sinus pressure and sneezing. Negative for congestion, ear pain and sore throat.   Respiratory: Positive for cough. Negative for shortness of breath.   Gastrointestinal: Positive for abdominal pain and flatus. Negative for nausea, vomiting and diarrhea.  Genitourinary: Negative for dysuria.  Musculoskeletal: Positive for neck pain (right side).  Neurological: Positive for headaches.    Filed Vitals:   11/26/15 1602  BP: 136/76  Pulse: 87  Temp: 99.1 F (37.3 C)  Resp: 16       Objective:   Physical Exam  Constitutional: She appears well-developed and well-nourished. No distress.  HENT:  Head:  Normocephalic and atraumatic.  Right Ear: Hearing, tympanic membrane, external ear and ear canal normal.  Left Ear: Hearing, tympanic membrane, external ear and ear canal normal.  Nose: Right sinus exhibits maxillary sinus tenderness and frontal sinus tenderness. Left sinus exhibits maxillary sinus tenderness and frontal sinus tenderness.  Mouth/Throat: Uvula is midline, oropharynx is clear and moist and mucous membranes are normal. No oropharyngeal exudate.  Neck: Normal range of motion. Neck supple. No tracheal deviation present. No thyromegaly present.  Cardiovascular: Normal rate, regular rhythm and normal heart sounds.  Exam reveals no gallop and no friction rub.   No murmur heard. Pulmonary/Chest: Effort normal and breath sounds normal. No stridor. No respiratory distress. She has no wheezes. She has no rales.  Abdominal: Soft. Bowel sounds are normal. She exhibits no distension and no mass. There is no tenderness. There is no rebound and no guarding.  Lymphadenopathy:    She has no cervical adenopathy.  Skin: She is not diaphoretic.  Vitals reviewed.      Assessment:     1. Gastroesophageal reflux disease with esophagitis   2. Acute pansinusitis, recurrence not specified        Plan:     1. Gastroesophageal reflux disease with esophagitis Seems to have worsening GERD symptoms with esophagitis.  Will change treatment from ranitidine to nexium as below.  Advised that it is possible she could be developing gastritis and esophagitis from chronic, long-term use of meloxicam.  Will treat as below and see if any improvement is made before discontinuing meloxicam. She is to call if no improvement in symptoms. - esomeprazole ([place]) 20  MG capsule; Take 1 capsule (20 mg total) by mouth daily before breakfast.  Dispense: 30 capsule; Refill: 1  2. Acute pansinusitis, recurrence not specified Worsening symptoms that has not responded to OTC medications. Will give augmentin as below. May  continue antihistamine and flonase. She is to call if symptoms fail to improve or worsen. - amoxicillin-clavulanate (AUGMENTIN) 875-125 MG tablet; Take 1 tablet by mouth 2 (two) times daily.  Dispense: 14 tablet; Refill: 0

## 2015-11-28 ENCOUNTER — Other Ambulatory Visit: Payer: Self-pay | Admitting: Physician Assistant

## 2015-11-28 DIAGNOSIS — K219 Gastro-esophageal reflux disease without esophagitis: Secondary | ICD-10-CM

## 2015-11-28 MED ORDER — PANTOPRAZOLE SODIUM 40 MG PO TBEC: 40.0000 mg | DELAYED_RELEASE_TABLET | Freq: Every day | ORAL | Status: DC

## 2016-07-15 ENCOUNTER — Encounter: Payer: Self-pay | Admitting: Emergency Medicine

## 2016-07-15 DIAGNOSIS — R002 Palpitations: Secondary | ICD-10-CM | POA: Diagnosis present

## 2016-07-15 LAB — TROPONIN I: Troponin I: <0.03 ng/mL (ref <0.03)

## 2016-07-15 LAB — CBC
HEMATOCRIT: 40.2 % (ref 35.0–47.0)
HEMOGLOBIN: 13.6 g/dL (ref 12.0–16.0)
MCH: 31.4 pg (ref 26.0–34.0)
MCHC: 33.8 g/dL (ref 32.0–36.0)
MCV: 92.9 fL (ref 80.0–100.0)
Platelets: 350 10*3/uL (ref 150–440)
RBC: 4.32 MIL/uL (ref 3.80–5.20)
RDW: 13.2 % (ref 11.5–14.5)
WBC: 11.8 10*3/uL — ABNORMAL HIGH (ref 3.6–11.0)

## 2016-07-15 LAB — BASIC METABOLIC PANEL
Anion gap: 8 (ref 5–15)
BUN: 14 mg/dL (ref 6–20)
CHLORIDE: 107 mmol/L (ref 101–111)
CO2: 26 mmol/L (ref 22–32)
CREATININE: 0.8 mg/dL (ref 0.44–1.00)
Calcium: 8.9 mg/dL (ref 8.9–10.3)
GFR calc Af Amer: >60 mL/min (ref >60)
GFR calc non Af Amer: >60 mL/min (ref >60)
Glucose, Bld: 145 mg/dL — ABNORMAL HIGH (ref 65–99)
Potassium: 3.6 mmol/L (ref 3.5–5.1)
Sodium: 141 mmol/L (ref 135–145)

## 2016-07-15 NOTE — ED Triage Notes (Signed)
Pt in with co palpitations since today and and bilat shoulder pain, some shob none noted. Pt denies any vomiting or diaphoresis, has had high bp recently.

## 2016-07-16 ENCOUNTER — Ambulatory Visit (INDEPENDENT_AMBULATORY_CARE_PROVIDER_SITE_OTHER): Payer: 59 | Admitting: Physician Assistant

## 2016-07-16 ENCOUNTER — Encounter: Payer: Self-pay | Admitting: Physician Assistant

## 2016-07-16 ENCOUNTER — Emergency Department: Payer: 59

## 2016-07-16 ENCOUNTER — Emergency Department
Admission: EM | Admit: 2016-07-16 | Discharge: 2016-07-16 | Disposition: A | Discharge status: Home / Self Care | Payer: 59 | Attending: Emergency Medicine | Admitting: Emergency Medicine

## 2016-07-16 VITALS — BP 148/80 | HR 88 | Temp 98.3°F | Resp 16 | Wt 200.2 lb

## 2016-07-16 DIAGNOSIS — R002 Palpitations: Secondary | ICD-10-CM

## 2016-07-16 DIAGNOSIS — I10 Essential (primary) hypertension: Secondary | ICD-10-CM

## 2016-07-16 LAB — TROPONIN I: Troponin I: <0.03 ng/mL (ref <0.03)

## 2016-07-16 LAB — MAGNESIUM: Magnesium: 2.2 mg/dL (ref 1.7–2.4)

## 2016-07-16 NOTE — Discharge Instructions (Signed)
Please return immediately if condition worsens. Please contact her primary physician or the physician you were given for referral. If you have any specialist physicians involved in her treatment and plan please also contact them. Thank you for using  regional emergency Department. ° °

## 2016-07-16 NOTE — Progress Notes (Signed)
Patient: Molly Potter Female    DOB: 1956-04-29   60 y.o.   MRN: [id number] Visit Date: 07/16/2016  Today's Provider: Mar Daring, PA-C   No chief complaint on file.  Subjective:    HPI  Follow Up ER Visit  Patient is here for ER follow up.  She was recently seen at [facility] for heart palpitations on 07/15/16 last night. No prodrome symptoms noted during palpitations. Treatment for this included none. Patient had an EKG done with symptom resolution upon arrival. No EKG or lab abnormalities.  She reports good compliance with treatment. She reports this condition is Improved. She has had similar episodes in the past but has been over one year ago. She had two episodes last year with the first being secondary to venlafaxine adverse reaction and the second was unknown. It was recommended at the time for her to follow up with cardiology for holter monitor but patient refused. She reports that this most recent event and her elevated BP were secondary to recently taking sudafed for an URI.  ------------------------------------------------------------------------------------     Allergies  Allergen Reactions  . Effexor [Venlafaxine] Anaphylaxis  . Diazepam     Other reaction(s): Unknown  . Tramadol Nausea Only     Current Outpatient Prescriptions:  .  acetaminophen (TYLENOL) 500 MG tablet, Take 1,000 mg by mouth every 6 (six) hours as needed., Disp: , Rfl:  .  cyclobenzaprine (FLEXERIL) 5 MG tablet, Take by mouth., Disp: , Rfl:  .  meloxicam (MOBIC) 15 MG tablet, , Disp: , Rfl: 1 .  amoxicillin-clavulanate (AUGMENTIN) 875-125 MG tablet, Take 1 tablet by mouth 2 (two) times daily. (Patient not taking: Reported on 07/16/2016), Disp: 14 tablet, Rfl: 0 .  pantoprazole (PROTONIX) 40 MG tablet, Take 1 tablet (40 mg total) by mouth daily. (Patient not taking: Reported on 07/16/2016), Disp: 30 tablet, Rfl: 1  Review of Systems  Constitutional: Negative for fatigue.  HENT:  Negative.   Respiratory: Negative for cough, chest tightness, shortness of breath and wheezing.   Cardiovascular: Positive for palpitations. Negative for chest pain and leg swelling.  Gastrointestinal: Negative.   Neurological: Negative.   Psychiatric/Behavioral: Negative.     Social History  Substance Use Topics  . Smoking status: Never Smoker  . Smokeless tobacco: Never Used  . Alcohol use Yes     Comment: Occasional   Objective:   BP (!) 148/80 (BP Location: Right Arm, Patient Position: Sitting, Cuff Size: Normal)   Pulse 88   Temp 98.3 F (36.8 C) (Oral)   Resp 16   Wt 200 lb 3.2 oz (90.8 kg)   SpO2 97%   BMI 33.32 kg/m   Physical Exam  Constitutional: She appears well-developed and well-nourished. No distress.  Neck: Normal range of motion. Neck supple. No JVD present. No tracheal deviation present. No thyromegaly present.  Cardiovascular: Normal rate, regular rhythm and normal heart sounds.  Exam reveals no gallop and no friction rub.   No murmur heard. Pulmonary/Chest: Effort normal and breath sounds normal. No respiratory distress. She has no wheezes. She has no rales.  Musculoskeletal: She exhibits no edema.  Lymphadenopathy:    She has no cervical adenopathy.  Skin: She is not diaphoretic.  Psychiatric: She has a normal mood and affect. Her behavior is normal. Judgment and thought content normal.  Vitals reviewed.     Assessment & Plan:     1. Essential hypertension BP is still elevated today but she feels it  is from the sudafed. Recommended her to take Coricidin HBP instead or a pseudoephedrine free decongestant. She is to return in 3 months for recheck of her BP.   2. Heart palpitations All hospital results (labs, CXR, and EKG) were reviewed and are WNL. Discussed cardiology referral but patient refuses. She is to call the office if symptoms occur again.       Mar Daring, PA-C  [facility]

## 2016-07-16 NOTE — Patient Instructions (Addendum)
Hypertension Hypertension, commonly called high blood pressure, is when the force of blood pumping through your arteries is too strong. Your arteries are the blood vessels that carry blood from your heart throughout your body. A blood pressure reading consists of a higher number over a lower number, such as 110/72. The higher number (systolic) is the pressure inside your arteries when your heart pumps. The lower number (diastolic) is the pressure inside your arteries when your heart relaxes. Ideally you want your blood pressure below 120/80. Hypertension forces your heart to work harder to pump blood. Your arteries may become narrow or stiff. Having untreated or uncontrolled hypertension can cause heart attack, stroke, kidney disease, and other problems. RISK FACTORS Some risk factors for high blood pressure are controllable. Others are not.  Risk factors you cannot control include:   Race. You may be at higher risk if you are African American.  Age. Risk increases with age.  Gender. Men are at higher risk than women before age 45 years. After age 65, women are at higher risk than men. Risk factors you can control include:  Not getting enough exercise or physical activity.  Being overweight.  Getting too much fat, sugar, calories, or salt in your diet.  Drinking too much alcohol. SIGNS AND SYMPTOMS Hypertension does not usually cause signs or symptoms. Extremely high blood pressure (hypertensive crisis) may cause headache, anxiety, shortness of breath, and nosebleed. DIAGNOSIS To check if you have hypertension, your health care provider will measure your blood pressure while you are seated, with your arm held at the level of your heart. It should be measured at least twice using the same arm. Certain conditions can cause a difference in blood pressure between your right and left arms. A blood pressure reading that is higher than normal on one occasion does not mean that you need treatment. If  it is not clear whether you have high blood pressure, you may be asked to return on a different day to have your blood pressure checked again. Or, you may be asked to monitor your blood pressure at home for 1 or more weeks. TREATMENT Treating high blood pressure includes making lifestyle changes and possibly taking medicine. Living a healthy lifestyle can help lower high blood pressure. You may need to change some of your habits. Lifestyle changes may include:  Following the DASH diet. This diet is high in fruits, vegetables, and whole grains. It is low in salt, red meat, and added sugars.  Keep your sodium intake below 2,300 mg per day.  Getting at least 30-45 minutes of aerobic exercise at least 4 times per week.  Losing weight if necessary.  Not smoking.  Limiting alcoholic beverages.  Learning ways to reduce stress. Your health care provider may prescribe medicine if lifestyle changes are not enough to get your blood pressure under control, and if one of the following is true:  You are 18-59 years of age and your systolic blood pressure is above 140.  You are 60 years of age or older, and your systolic blood pressure is above 150.  Your diastolic blood pressure is above 90.  You have diabetes, and your systolic blood pressure is over 140 or your diastolic blood pressure is over 90.  You have kidney disease and your blood pressure is above 140/90.  You have heart disease and your blood pressure is above 140/90. Your personal target blood pressure may vary depending on your medical conditions, your age, and other factors. HOME CARE INSTRUCTIONS    Have your blood pressure rechecked as directed by your health care provider.   Take medicines only as directed by your health care provider. Follow the directions carefully. Blood pressure medicines must be taken as prescribed. The medicine does not work as well when you skip doses. Skipping doses also puts you at risk for  problems.  Do not smoke.   Monitor your blood pressure at home as directed by your health care provider. SEEK MEDICAL CARE IF:   You think you are having a reaction to medicines taken.  You have recurrent headaches or feel dizzy.  You have swelling in your ankles.  You have trouble with your vision. SEEK IMMEDIATE MEDICAL CARE IF:  You develop a severe headache or confusion.  You have unusual weakness, numbness, or feel faint.  You have severe chest or abdominal pain.  You vomit repeatedly.  You have trouble breathing. MAKE SURE YOU:   Understand these instructions.  Will watch your condition.  Will get help right away if you are not doing well or get worse.   This information is not intended to replace advice given to you by your health care provider. Make sure you discuss any questions you have with your health care provider.   Document Released: 08/30/2005 Document Revised: 01/14/2015 Document Reviewed: 06/22/2013 Elsevier Interactive Patient Education 2016 Elsevier Inc. DASH Eating Plan DASH stands for "Dietary Approaches to Stop Hypertension." The DASH eating plan is a healthy eating plan that has been shown to reduce high blood pressure (hypertension). Additional health benefits may include reducing the risk of type 2 diabetes mellitus, heart disease, and stroke. The DASH eating plan may also help with weight loss. WHAT DO I NEED TO KNOW ABOUT THE DASH EATING PLAN? For the DASH eating plan, you will follow these general guidelines:  Choose foods with a percent daily value for sodium of less than 5% (as listed on the food label).  Use salt-free seasonings or herbs instead of table salt or sea salt.  Check with your health care provider or pharmacist before using salt substitutes.  Eat lower-sodium products, often labeled as "lower sodium" or "no salt added."  Eat fresh foods.  Eat more vegetables, fruits, and low-fat dairy products.  Choose whole grains.  Look for the word "whole" as the first word in the ingredient list.  Choose fish and skinless chicken or turkey more often than red meat. Limit fish, poultry, and meat to 6 oz (170 g) each day.  Limit sweets, desserts, sugars, and sugary drinks.  Choose heart-healthy fats.  Limit cheese to 1 oz (28 g) per day.  Eat more home-cooked food and less restaurant, buffet, and fast food.  Limit fried foods.  Cook foods using methods other than frying.  Limit canned vegetables. If you do use them, rinse them well to decrease the sodium.  When eating at a restaurant, ask that your food be prepared with less salt, or no salt if possible. WHAT FOODS CAN I EAT? Seek help from a dietitian for individual calorie needs. Grains Whole grain or whole wheat bread. Brown rice. Whole grain or whole wheat pasta. Quinoa, bulgur, and whole grain cereals. Low-sodium cereals. Corn or whole wheat flour tortillas. Whole grain cornbread. Whole grain crackers. Low-sodium crackers. Vegetables Fresh or frozen vegetables (raw, steamed, roasted, or grilled). Low-sodium or reduced-sodium tomato and vegetable juices. Low-sodium or reduced-sodium tomato sauce and paste. Low-sodium or reduced-sodium canned vegetables.  Fruits All fresh, canned (in natural juice), or frozen fruits. Meat and Other   Protein Products Ground beef (85% or leaner), grass-fed beef, or beef trimmed of fat. Skinless chicken or turkey. Ground chicken or turkey. Pork trimmed of fat. All fish and seafood. Eggs. Dried beans, peas, or lentils. Unsalted nuts and seeds. Unsalted canned beans. Dairy Low-fat dairy products, such as skim or 1% milk, 2% or reduced-fat cheeses, low-fat ricotta or cottage cheese, or plain low-fat yogurt. Low-sodium or reduced-sodium cheeses. Fats and Oils Tub margarines without trans fats. Light or reduced-fat mayonnaise and salad dressings (reduced sodium). Avocado. Safflower, olive, or canola oils. Natural peanut or almond  butter. Other Unsalted popcorn and pretzels. The items listed above may not be a complete list of recommended foods or beverages. Contact your dietitian for more options. WHAT FOODS ARE NOT RECOMMENDED? Grains White bread. White pasta. White rice. Refined cornbread. Bagels and croissants. Crackers that contain trans fat. Vegetables Creamed or fried vegetables. Vegetables in a cheese sauce. Regular canned vegetables. Regular canned tomato sauce and paste. Regular tomato and vegetable juices. Fruits Dried fruits. Canned fruit in light or heavy syrup. Fruit juice. Meat and Other Protein Products Fatty cuts of meat. Ribs, chicken wings, bacon, sausage, bologna, salami, chitterlings, fatback, hot dogs, bratwurst, and packaged luncheon meats. Salted nuts and seeds. Canned beans with salt. Dairy Whole or 2% milk, cream, half-and-half, and cream cheese. Whole-fat or sweetened yogurt. Full-fat cheeses or blue cheese. Nondairy creamers and whipped toppings. Processed cheese, cheese spreads, or cheese curds. Condiments Onion and garlic salt, seasoned salt, table salt, and sea salt. Canned and packaged gravies. Worcestershire sauce. Tartar sauce. Barbecue sauce. Teriyaki sauce. Soy sauce, including reduced sodium. Steak sauce. Fish sauce. Oyster sauce. Cocktail sauce. Horseradish. Ketchup and mustard. Meat flavorings and tenderizers. Bouillon cubes. Hot sauce. Tabasco sauce. Marinades. Taco seasonings. Relishes. Fats and Oils Butter, stick margarine, lard, shortening, ghee, and bacon fat. Coconut, palm kernel, or palm oils. Regular salad dressings. Other Pickles and olives. Salted popcorn and pretzels. The items listed above may not be a complete list of foods and beverages to avoid. Contact your dietitian for more information. WHERE CAN I FIND MORE INFORMATION? National Heart, Lung, and Blood Institute: www.nhlbi.nih.gov/health/health-topics/topics/dash/   This information is not intended to replace  advice given to you by your health care provider. Make sure you discuss any questions you have with your health care provider.   Document Released: 08/19/2011 Document Revised: 09/20/2014 Document Reviewed: 07/04/2013 Elsevier Interactive Patient Education 2016 Elsevier Inc.  

## 2016-07-16 NOTE — ED Notes (Signed)
ED Provider at bedside. 

## 2016-07-16 NOTE — ED Provider Notes (Signed)
Time Seen: Approximately 0118 I have reviewed the triage notes  Chief Complaint: Palpitations   History of Present Illness: Molly Potter is a 60 y.o. female who states that she developed some heart palpitations earlier this evening. Patient states that yesterday she developed heart palpitations and feeling that her heart was racing. She did not check her pulse at home. She denies any associated chest pain and some mild shortness of breath. She denies any the symptoms at present and states her resolved shortly after arrival here to the emergency department. She states she had history in the past of some heart palpitations though remote. She denies any stimulants tonight such as caffeine or decongestants, etc. She denies any nausea, vomiting, pulmonary emboli risk factors such as recent travel etc. No past medical history on file.  Patient Active Problem List   Diagnosis Date Noted  . Impingement syndrome of shoulder 07/02/2015  . Complete rotator cuff rupture of left shoulder 07/02/2015  . Cervical radiculitis 07/02/2015  . Joint pain 04/10/2015  . Arthritis sicca 04/09/2015  . Accumulation of fluid in tissues 04/09/2015  . Blood pressure elevated without history of HTN 04/09/2015  . H/O eczema 04/09/2015  . Hyperthyroidism 04/09/2015  . Arthritis 04/09/2015  . Fatigue 04/09/2015    Past Surgical History:  Procedure Laterality Date  . ROTATOR CUFF REPAIR Right   . TUBAL LIGATION      Past Surgical History:  Procedure Laterality Date  . ROTATOR CUFF REPAIR Right   . TUBAL LIGATION      Current Outpatient Rx  . Order #: [id number] Class: Historical Med  . Order #: [id number] Class: Normal  . Order #: [id number] Class: Historical Med  . Order #: [id number] Class: Normal    Allergies:  Effexor [venlafaxine]; Diazepam; and Tramadol  Family History: Family History  Problem Relation Age of Onset  . CAD Mother   . Throat cancer Mother   . Hypertension Father   . Lung cancer  Father   . Diabetes Maternal Grandmother   . Diabetes Paternal Grandfather     Social History: Social History  Substance Use Topics  . Smoking status: Never Smoker  . Smokeless tobacco: Never Used  . Alcohol use Yes     Comment: Occasional     Review of Systems:   10 point review of systems was performed and was otherwise negative:  Constitutional: No fever Eyes: No visual disturbances ENT: No sore throat, ear pain Cardiac: No chest pain Respiratory: No shortness of breath, wheezing, or stridor Abdomen: No abdominal pain, no vomiting, No diarrhea Endocrine: No weight loss, No night sweats Extremities: No peripheral edema, cyanosis Skin: No rashes, easy bruising Neurologic: No focal weakness, trouble with speech or swollowing Urologic: No dysuria, Hematuria, or urinary frequency   Physical Exam:  ED Triage Vitals  Enc Vitals Group     BP 07/15/16 2237 (!) 150/79     Pulse Rate 07/15/16 2237 (!) 104     Resp 07/15/16 2237 19     Temp 07/15/16 2237 97.9 F (36.6 C)     Temp Source 07/15/16 2237 Oral     SpO2 07/15/16 2237 97 %     Weight 07/15/16 2233 203 lb (92.1 kg)     Height 07/15/16 2233 5\' 5"  (1.651 m)     Head Circumference --      Peak Flow --      Pain Score 07/16/16 0122 0     Pain Loc --      Pain Edu? --  Excl. in [place]? --     General: Awake , Alert , and Oriented times 3; GCS 15 Head: Normal cephalic , atraumatic Eyes: Pupils equal , round, reactive to light Nose/Throat: No nasal drainage, patent upper airway without erythema or exudate.  Neck: Supple, Full range of motion, No anterior adenopathy or palpable thyroid masses Lungs: Clear to ascultation without wheezes , rhonchi, or rales Heart: Regular rate, regular rhythm without murmurs , gallops , or rubs Abdomen: Soft, non tender without rebound, guarding , or rigidity; bowel sounds positive and symmetric in all 4 quadrants. No organomegaly .        Extremities: 2 plus symmetric pulses. No  edema, clubbing or cyanosis Neurologic: normal ambulation, Motor symmetric without deficits, sensory intact Skin: warm, dry, no rashes   Labs:   All laboratory work was reviewed including any pertinent negatives or positives listed below:  Labs Reviewed  CBC - Abnormal; Notable for the following:       Result Value   WBC 11.8 (*)    All other components within normal limits  BASIC METABOLIC PANEL - Abnormal; Notable for the following:    Glucose, Bld 145 (*)    All other components within normal limits  TROPONIN I  TROPONIN I  MAGNESIUM   Laboratory work was reviewed and showed no clinically significant abnormalities.  EKG ED ECG REPORT I, Daymon Larsen, the attending physician, personally viewed and interpreted this ECG.  Date: 07/16/2016 EKG Time:2234Rate: 101 Rhythm: normal sinus rhythm QRS Axis: normal Intervals: normal ST/T Wave abnormalities: normal Conduction Disturbances: none Narrative Interpretation: unremarkable Mild criteria for left ventricular hypertrophy No acute ischemic changes   Radiology: "Dg Chest 2 View  Result Date: 07/16/2016 CLINICAL DATA:  Acute onset of palpitations and bilateral shoulder pain. Shortness of breath. Initial encounter. EXAM: CHEST  2 VIEW COMPARISON:  None. FINDINGS: The lungs are well-aerated and clear. There is no evidence of focal opacification, pleural effusion or pneumothorax. The heart is borderline normal in size. No acute osseous abnormalities are seen. There is mild chronic loss of height at the lower thoracic spine. IMPRESSION: No acute cardiopulmonary process seen. Electronically Signed   By: Garald Balding M.D.   On: 07/16/2016 02:21  "  "I personally reviewed the radiologic studies    ED Course: * Patient's stay here was uneventful she was continued on the cardiac monitor which did not show any ectopy or abnormalities. The patient otherwise remained asymptomatic and stable at this point. Her differential is  difficult since her heart palpitations and feeling that her heart was racing have resolved since her stay here in emergency department. Possible SVT, atrial fibrillation, etc. Patient was referred back to her primary physician and also given cardiology unassigned for possible outpatient Holter monitoring. Clinical Course     Assessment:  Heart palpitations     Plan: * Outpatient Patient was advised to return immediately if condition worsens. Patient was advised to follow up with their primary care physician or other specialized physicians involved in their outpatient care. The patient and/or family member/power of attorney had laboratory results reviewed at the bedside. All questions and concerns were addressed and appropriate discharge instructions were distributed by the nursing staff.             Daymon Larsen, MD 07/16/16 [phone]

## 2016-07-19 ENCOUNTER — Telehealth: Payer: Self-pay

## 2016-07-19 NOTE — Telephone Encounter (Signed)
lmov for patient to call back and schedule ED fu seen for Palpitations

## 2016-07-20 NOTE — Telephone Encounter (Signed)
Second attempt  lmov for patient to call back and schedule ED fu seen for Palpitations

## 2016-07-28 ENCOUNTER — Ambulatory Visit (INDEPENDENT_AMBULATORY_CARE_PROVIDER_SITE_OTHER): Payer: 59

## 2016-07-28 ENCOUNTER — Encounter: Payer: Self-pay | Admitting: Cardiology

## 2016-07-28 ENCOUNTER — Ambulatory Visit (INDEPENDENT_AMBULATORY_CARE_PROVIDER_SITE_OTHER): Payer: 59 | Admitting: Cardiology

## 2016-07-28 VITALS — BP 148/92 | HR 73 | Ht 65.0 in | Wt 201.0 lb

## 2016-07-28 DIAGNOSIS — R030 Elevated blood-pressure reading, without diagnosis of hypertension: Secondary | ICD-10-CM

## 2016-07-28 DIAGNOSIS — R000 Tachycardia, unspecified: Secondary | ICD-10-CM

## 2016-07-28 DIAGNOSIS — Z6833 Body mass index (BMI) 33.0-33.9, adult: Secondary | ICD-10-CM | POA: Diagnosis not present

## 2016-07-28 DIAGNOSIS — E6609 Other obesity due to excess calories: Secondary | ICD-10-CM | POA: Diagnosis not present

## 2016-07-28 NOTE — Progress Notes (Unsigned)
Zio Monitor placed on patient while here for her office visit. Monitor number [id number] placed for 14 days monitoring period. Reviewed instructions with patient and she verbalized understanding with no further questions.

## 2016-07-28 NOTE — Progress Notes (Signed)
Cardiology Office Note   Date:  07/28/2016   ID:  Molly Potter, DOB 29-Aug-1956, MRN [id number]  Referring Doctor:  Mar Daring, PA-C   Cardiologist:   Wende Bushy, MD   Reason for consultation:  Chief Complaint  Patient presents with  . other    Follow up from [facility] ER; rapid heart beats. Meds reviewed by the pt's bottles. Pt. c/o rapid heart beats at times      History of Present Illness: Molly Potter is a 60 y.o. female who presents for Evaluation of palpitations.  3 weeks ago, she had noticed sudden onset of feeling her heart racing. She feels that this may have been related to being anxious or nervous. She had another episode at the dentist's office prior to getting a filling done. Symptoms are moderate in severity, lasting a few minutes at a time, resolving eventually spontaneously. Not occur with exertion. No associated lightheadedness or syncope or presyncopal episodes.  In terms of blood pressure, she has noticed her blood pressure creeping up lately. 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   No chest pain, shortness of breath, lightheadedness, dizziness.   ROS:  Please see the history of present illness. Aside from mentioned under HPI, all other systems are reviewed and negative.     Past Medical History:  Diagnosis Date  . Arthritis   . Fatigue   . History of eczema   . Hyperthyroidism   . Joint pain     Past Surgical History:  Procedure Laterality Date  . ROTATOR CUFF REPAIR Right   . TUBAL LIGATION       reports that she has never smoked. She has never used smokeless tobacco. She reports that she drinks alcohol. She reports that she does not use drugs.   family history includes CAD in her mother; Diabetes in her maternal grandmother and paternal grandfather; Hypertension in her father; Lung cancer in her father; Throat cancer in her mother.   Outpatient Medications Prior  to Visit  Medication Sig Dispense Refill  . acetaminophen (TYLENOL) 500 MG tablet Take 1,000 mg by mouth every 6 (six) hours as needed.    . cyclobenzaprine (FLEXERIL) 5 MG tablet Take by mouth.    . meloxicam (MOBIC) 15 MG tablet   1   No facility-administered medications prior to visit.      Allergies: Effexor [venlafaxine]; Diazepam; and Tramadol    PHYSICAL EXAM: VS:  BP (!) 148/92 (BP Location: Left Arm, Patient Position: Sitting, Cuff Size: Normal)   Pulse 73   Ht 5\' 5"  (1.651 m)   Wt 201 lb (91.2 kg)   BMI 33.45 kg/m  , Body mass index is 33.45 kg/m. Wt Readings from Last 3 Encounters:  07/28/16 201 lb (91.2 kg)  07/16/16 200 lb 3.2 oz (90.8 kg)  07/15/16 203 lb (92.1 kg)    GENERAL:  well developed, well nourished,obese, not in acute distress HEENT: normocephalic, pink conjunctivae, anicteric sclerae, no xanthelasma, normal dentition, oropharynx clear NECK:  no neck vein engorgement, JVP normal, no hepatojugular reflux, carotid upstroke brisk and symmetric, no bruit, no thyromegaly, no lymphadenopathy LUNGS:  good respiratory effort, clear to auscultation bilaterally CV:  PMI not displaced, no thrills, no lifts, S1 and S2 within normal limits, no palpable S3 or S4, no murmurs, no rubs, no gallops ABD:  Soft, nontender, nondistended, normoactive bowel sounds, no abdominal aortic bruit, no hepatomegaly, no splenomegaly MS: nontender  back, no kyphosis, no scoliosis, no joint deformities EXT:  2+ DP/PT pulses, no edema, no varicosities, no cyanosis, no clubbing SKIN: warm, nondiaphoretic, normal turgor, no ulcers NEUROPSYCH: alert, oriented to person, place, and time, sensory/motor grossly intact, normal mood, appropriate affect  Recent Labs: 07/15/2016: BUN 14; Creatinine, Ser 0.80; Hemoglobin 13.6; Platelets 350; Potassium 3.6; Sodium 141 07/16/2016: Magnesium 2.2   Lipid Panel No results found for: CHOL, TRIG, HDL, CHOLHDL, VLDL, LDLCALC, LDLDIRECT   Other studies  Reviewed:  EKG:  The ekg from 07/28/2016 was personally reviewed by me and it revealed sinus rhythm, 74 BPM  Additional studies/ records that were reviewed personally reviewed by me today include: None available   ASSESSMENT AND PLAN: Palpitations Recommend objective evaluation with long-term monitor for 2 weeks at least per she does not get any daily symptoms.  Elevated blood pressure Recommend blood pressure log. Goal is less than 140/80, closer to 130/80. Patient may follow-up with PCP for this.  Obesity Body mass index is 33.45 kg/m.Marland Kitchen Recommend aggressive weight loss through diet and increased physical activity.     Current medicines are reviewed at length with the patient today.  The patient does not have concerns regarding medicines.  Labs/ tests ordered today include:  Orders Placed This Encounter  Procedures  . LONG TERM MONITOR (3-14 DAYS)  . EKG 12-Lead    I had a lengthy and detailed discussion with the patient regarding diagnoses, prognosis, diagnostic options, treatment options , and side effects of medications.   I counseled the patient on importance of lifestyle modification including heart healthy diet, regular physical activity.   Disposition:   FU with undersigned after tests    Signed, Wende Bushy, MD  07/28/2016 4:45 PM    [place]  This note was generated in part with voice recognition software and I apologize for any typographical errors that were not detected and corrected.

## 2016-07-28 NOTE — Patient Instructions (Signed)
Testing/Procedures: Your physician has recommended that you wear a holter monitor. Holter monitors are medical devices that record the heart's electrical activity. Doctors most often use these monitors to diagnose arrhythmias. Arrhythmias are problems with the speed or rhythm of the heartbeat. The monitor is a small, portable device. You can wear one while you do your normal daily activities. This is usually used to diagnose what is causing palpitations/syncope (passing out).  Zio patch placed [id number]  Wear until 08/11/16 and then remove and drop in mailbox.    Follow-Up: Your physician recommends that you schedule a follow-up appointment as needed with Dr. Yvone Neu.   It was a pleasure seeing you today here in the office. Please do not hesitate to give [place] a call back if you have any further questions. [place], BSN     Any Other Special Instructions Will Be Listed Below (If Applicable).     If you need a refill on your cardiac medications before your next appointment, please call your pharmacy.

## 2016-08-03 ENCOUNTER — Telehealth: Payer: Self-pay | Admitting: Cardiology

## 2016-08-03 NOTE — Telephone Encounter (Signed)
Discussed with Dr. Yvone Neu regarding monitor not being on for full time frame and she stated that was fine. Spoke to patient and she states that she had some red bumps and irritated skin where the monitor was at. She also states that it fell off last night. She denied any rapid heart beats during the period of time that she had the monitor in place. Let her know to just mail the monitor back and that if her symptoms returned then we could possibly order another one if needed. She verbalized understanding and had no further questions at this time.

## 2016-08-03 NOTE — Telephone Encounter (Signed)
Patient has welps red bumps and irritated from monitor and it came off.  Please call and tell patient what to do.

## 2016-08-24 ENCOUNTER — Telehealth: Payer: Self-pay | Admitting: *Deleted

## 2016-08-24 MED ORDER — METOPROLOL SUCCINATE ER 25 MG PO TB24: 25.0000 mg | ORAL_TABLET | Freq: Every day | ORAL | 6 refills | Status: DC

## 2016-08-24 NOTE — Telephone Encounter (Signed)
-----   Message from Wende Bushy, MD sent at 08/18/2016  1:52 PM EST ----- Evidence of paroxysmal atrial tachycardia, the longest is only almost 9 seconds. There is no documentation that she felt that episode. If she would like, we can try metoprolol XL 25 mg daily. Monitor blood pressure. Follow-up in 2-3 months time.

## 2016-08-24 NOTE — Telephone Encounter (Signed)
Reviewed results and recommendations with patient. Reviewed medication along with possible side effects and instructions to monitor her blood pressures as well.  She verbalized understanding of all instructions and had no further questions at this time. Follow up appointment scheduled with Dr. Yvone Neu for November 23, 2016 at 08:45 AM.

## 2016-09-02 ENCOUNTER — Telehealth: Payer: Self-pay | Admitting: Cardiology

## 2016-09-02 NOTE — Telephone Encounter (Signed)
Pt calling stating we placed her on some new medication She is having issues She is having upset stomach, every time she takes it  It just get worst. Please advise.

## 2016-09-02 NOTE — Telephone Encounter (Signed)
No answer. Left message to call back.   

## 2016-09-03 ENCOUNTER — Telehealth: Payer: Self-pay | Admitting: Cardiology

## 2016-09-03 MED ORDER — DILTIAZEM HCL ER COATED BEADS 120 MG PO CP24: 120.0000 mg | ORAL_CAPSULE | Freq: Every day | ORAL | 3 refills | Status: DC

## 2016-09-03 NOTE — Telephone Encounter (Signed)
Called patient back. She said she started the Toprol on 08/24/16 and from the very first moment the pill hit her stomach she felt nauseas and got stomach upset. She waited a few days and took some antidiarrheal medication but when she stopped that the diarrhea returned. She says its almost constantly coming out. She denies any change in her diet or eating anything out of the ordinary. She has not been around anyone who has been sick. Everytime she takes the Toprol she feels sick to her stomach. She did say it has helped her "racing of her heart" and is thankful of that, however, she can't go on with the diarrhea.  S/w Ignacia Bayley, NP. He reviewed symptoms and patient chart. He advised to stop the Toprol and have patient start Dilitiazem CD 120 mg by mouth once a day. He said to let her know possible side effects may be constipation or ankle swelling. If diarrhea continues after switching the medication is may not be r/t medications.

## 2016-09-03 NOTE — Telephone Encounter (Signed)
Called patient back. Gave her Molly Potter recommendation.  At first she was hesitate to make the switch b/c she says she is so sensitive to medications. However, she does not want to continue having diarrhea. She verbalized understanding of taking Diltiazem CD 120 (1 tablet) by mouth daily. We also discussed that if diarrhea continues it may not be r/t medications. New Rx sent to verified pharmacy.

## 2016-09-03 NOTE — Telephone Encounter (Signed)
Duplicate, see encounter from 09/03/16.

## 2016-09-03 NOTE — Telephone Encounter (Signed)
States her Toprol is upsetting her stomach and causing diarrhea, would like to be switched to something different. Please call.

## 2016-10-22 ENCOUNTER — Ambulatory Visit (INDEPENDENT_AMBULATORY_CARE_PROVIDER_SITE_OTHER): Payer: 59 | Admitting: Physician Assistant

## 2016-10-22 ENCOUNTER — Encounter: Payer: Self-pay | Admitting: Physician Assistant

## 2016-10-22 VITALS — BP 142/78 | HR 80 | Temp 98.6°F | Resp 16 | Wt 203.0 lb

## 2016-10-22 DIAGNOSIS — I10 Essential (primary) hypertension: Secondary | ICD-10-CM

## 2016-10-22 DIAGNOSIS — I471 Supraventricular tachycardia: Secondary | ICD-10-CM | POA: Diagnosis not present

## 2016-10-22 MED ORDER — AMLODIPINE BESYLATE 5 MG PO TABS: 5.0000 mg | ORAL_TABLET | Freq: Every day | ORAL | 1 refills | Status: DC

## 2016-10-22 NOTE — Patient Instructions (Signed)
Amlodipine tablets What is this medicine? AMLODIPINE (am LOE di peen) is a calcium-channel blocker. It affects the amount of calcium found in your heart and muscle cells. This relaxes your blood vessels, which can reduce the amount of work the heart has to do. This medicine is used to lower high blood pressure. It is also used to prevent chest pain. This medicine may be used for other purposes; ask your health care provider or pharmacist if you have questions. COMMON BRAND NAME(S): Norvasc What should I tell my health care provider before I take this medicine? They need to know if you have any of these conditions: -heart problems like heart failure or aortic stenosis -liver disease -an unusual or allergic reaction to amlodipine, other medicines, foods, dyes, or preservatives -pregnant or trying to get pregnant -breast-feeding How should I use this medicine? Take this medicine by mouth with a glass of water. Follow the directions on the prescription label. Take your medicine at regular intervals. Do not take more medicine than directed. Talk to your pediatrician regarding the use of this medicine in children. Special care may be needed. This medicine has been used in children as young as 6. Persons over 26 years old may have a stronger reaction to this medicine and need smaller doses. Overdosage: If you think you have taken too much of this medicine contact a poison control center or emergency room at once. NOTE: This medicine is only for you. Do not share this medicine with others. What if I miss a dose? If you miss a dose, take it as soon as you can. If it is almost time for your next dose, take only that dose. Do not take double or extra doses. What may interact with this medicine? -herbal or dietary supplements -local or general anesthetics -medicines for high blood pressure -medicines for prostate problems -rifampin This list may not describe all possible interactions. Give your health  care provider a list of all the medicines, herbs, non-prescription drugs, or dietary supplements you use. Also tell them if you smoke, drink alcohol, or use illegal drugs. Some items may interact with your medicine. What should I watch for while using this medicine? Visit your doctor or health care professional for regular check ups. Check your blood pressure and pulse rate regularly. Ask your health care professional what your blood pressure and pulse rate should be, and when you should contact him or her. This medicine may make you feel confused, dizzy or lightheaded. Do not drive, use machinery, or do anything that needs mental alertness until you know how this medicine affects you. To reduce the risk of dizzy or fainting spells, do not sit or stand up quickly, especially if you are an older patient. Avoid alcoholic drinks; they can make you more dizzy. Do not suddenly stop taking amlodipine. Ask your doctor or health care professional how you can gradually reduce the dose. What side effects may I notice from receiving this medicine? Side effects that you should report to your doctor or health care professional as soon as possible: -allergic reactions like skin rash, itching or hives, swelling of the face, lips, or tongue -breathing problems -changes in vision or hearing -chest pain -fast, irregular heartbeat -swelling of legs or ankles Side effects that usually do not require medical attention (report to your doctor or health care professional if they continue or are bothersome): -dry mouth -facial flushing -nausea, vomiting -stomach gas, pain -tired, weak -trouble sleeping This list may not describe all possible side  effects. Call your doctor for medical advice about side effects. You may report side effects to FDA at 1-800-FDA-1088. Where should I keep my medicine? Keep out of the reach of children. Store at room temperature between 59 and 86 degrees F (15 and 30 degrees C). Protect from  light. Keep container tightly closed. Throw away any unused medicine after the expiration date. NOTE: This sheet is a summary. It may not cover all possible information. If you have questions about this medicine, talk to your doctor, pharmacist, or health care provider.  2017 [employer]vier/Gold Standard (2012-07-28 11:40:58)

## 2016-10-22 NOTE — Progress Notes (Signed)
       Patient: Molly Potter Female    DOB: 08-12-1956   61 y.o.   MRN: [id number] Visit Date: 10/22/2016  Today's Provider: Mar Daring, PA-C   Chief Complaint  Patient presents with  . Hypertension    Follow up   Subjective:    Hypertension  This is a chronic problem. The problem has been gradually improving since onset. The problem is controlled (120-140's/70-80's). Pertinent negatives include no chest pain, headaches, neck pain or palpitations. Past treatments include beta blockers. Compliance problems include medication side effects (Pt has not been taking BP medications secondary to side effects. ).       Allergies  Allergen Reactions  . Effexor [Venlafaxine] Anaphylaxis  . Diazepam     Other reaction(s): Unknown  . Tramadol Nausea Only     Current Outpatient Prescriptions:  .  acetaminophen (TYLENOL) 500 MG tablet, Take 1,000 mg by mouth every 6 (six) hours as needed., Disp: , Rfl:  .  cyclobenzaprine (FLEXERIL) 5 MG tablet, Take by mouth., Disp: , Rfl:  .  meloxicam (MOBIC) 15 MG tablet, , Disp: , Rfl: 1 .  diltiazem (CARDIZEM CD) 120 MG 24 hr capsule, Take 1 capsule (120 mg total) by mouth daily. (Patient not taking: Reported on 10/22/2016), Disp: 90 capsule, Rfl: 3  Review of Systems  Constitutional: Negative.   Respiratory: Negative.   Cardiovascular: Negative for chest pain, palpitations and leg swelling.  Gastrointestinal: Negative.   Musculoskeletal: Positive for arthralgias. Negative for back pain, gait problem, joint swelling, myalgias, neck pain and neck stiffness.  Neurological: Negative for dizziness, light-headedness and headaches.    Social History  Substance Use Topics  . Smoking status: Never Smoker  . Smokeless tobacco: Never Used  . Alcohol use Yes     Comment: Occasional   Objective:   BP (!) 142/78 (BP Location: Left Arm, Patient Position: Sitting, Cuff Size: Large)   Pulse 80   Temp 98.6 F (37 C) (Oral)   Resp 16   Wt 203 lb  (92.1 kg)   BMI 33.78 kg/m   Physical Exam  Constitutional: She appears well-developed and well-nourished. No distress.  Neck: Normal range of motion. Neck supple. No tracheal deviation present. No thyromegaly present.  Cardiovascular: Normal rate, regular rhythm and normal heart sounds.  Exam reveals no gallop and no friction rub.   No murmur heard. Pulmonary/Chest: Effort normal and breath sounds normal. No respiratory distress. She has no wheezes. She has no rales.  Musculoskeletal: She exhibits no edema.  Lymphadenopathy:    She has no cervical adenopathy.  Skin: She is not diaphoretic.  Psychiatric: She has a normal mood and affect. Her behavior is normal. Judgment and thought content normal.  Vitals reviewed.     Assessment & Plan:     1. Atrial tachycardia, paroxysmal ([place]) Patient d/c cardizem on her own due to side effects. Will switch her to amlodipine as below. She is to chck her BP at home and call if it is running high. I will see her back in 3 months to recheck. - amLODipine (NORVASC) 5 MG tablet; Take 1 tablet (5 mg total) by mouth daily.  Dispense: 90 tablet; Refill: 1  2. Essential hypertension See above medical treatment plan. - amLODipine (NORVASC) 5 MG tablet; Take 1 tablet (5 mg total) by mouth daily.  Dispense: 90 tablet; Refill: [place], PA-C  [facility]

## 2016-10-27 ENCOUNTER — Telehealth: Payer: Self-pay

## 2016-10-27 NOTE — Telephone Encounter (Signed)
Called patient to let her know the form for renewal of disability placard is really. Per patient will pick this up tomorrow.  Thanks,  -Joseline

## 2016-11-19 ENCOUNTER — Telehealth: Payer: Self-pay | Admitting: Physician Assistant

## 2016-11-19 NOTE — Telephone Encounter (Signed)
Patient probably needs appt for this because amlodipine is not going to cause increased urination

## 2016-11-19 NOTE — Telephone Encounter (Signed)
Pt is request the Rx amLODipine (NORVASC) 5 MG tablet change to something different.  Pt states she is voiding all the time.  Pt states she is up 2 or 3 times at night needing to void and also has to stop excising 2 or 3 times to void.  [place]  WC#[phone]/[id number]

## 2016-11-19 NOTE — Telephone Encounter (Signed)
Patient was advised as directed below. Scheduled patient 03/16 at 3:45 pm

## 2016-11-19 NOTE — Telephone Encounter (Signed)
Please Review.  Thanks,  -Miyani Cronic 

## 2016-11-23 ENCOUNTER — Ambulatory Visit: Payer: 59 | Admitting: Cardiology

## 2016-11-23 ENCOUNTER — Encounter: Payer: Self-pay | Admitting: *Deleted

## 2016-11-26 ENCOUNTER — Ambulatory Visit (INDEPENDENT_AMBULATORY_CARE_PROVIDER_SITE_OTHER): Payer: BLUE CROSS/BLUE SHIELD | Admitting: Physician Assistant

## 2016-11-26 ENCOUNTER — Encounter: Payer: Self-pay | Admitting: Physician Assistant

## 2016-11-26 VITALS — BP 160/80 | HR 98 | Temp 98.0°F | Resp 16 | Wt 205.4 lb

## 2016-11-26 DIAGNOSIS — R350 Frequency of micturition: Secondary | ICD-10-CM

## 2016-11-26 DIAGNOSIS — F5101 Primary insomnia: Secondary | ICD-10-CM | POA: Diagnosis not present

## 2016-11-26 DIAGNOSIS — I10 Essential (primary) hypertension: Secondary | ICD-10-CM

## 2016-11-26 LAB — POCT URINALYSIS DIPSTICK
Bilirubin, UA: NEGATIVE
Glucose, UA: NEGATIVE
KETONES UA: NEGATIVE
LEUKOCYTES UA: NEGATIVE
NITRITE UA: NEGATIVE
PH UA: 6 (ref 5.0–8.0)
PROTEIN UA: NEGATIVE
RBC UA: NEGATIVE
Spec Grav, UA: 1.025 (ref 1.030–1.035)
Urobilinogen, UA: 0.2 (ref ?–2.0)

## 2016-11-26 MED ORDER — PROPRANOLOL HCL ER 60 MG PO CP24: 60.0000 mg | ORAL_CAPSULE | Freq: Every day | ORAL | 1 refills | Status: DC

## 2016-11-26 MED ORDER — TRAZODONE HCL 50 MG PO TABS: 50.0000 mg | ORAL_TABLET | Freq: Every evening | ORAL | 1 refills | Status: DC | PRN

## 2016-11-26 NOTE — Patient Instructions (Signed)
Trazodone tablets What is this medicine? TRAZODONE (TRAZ oh done) is used to treat depression. This medicine may be used for other purposes; ask your health care provider or pharmacist if you have questions. COMMON BRAND NAME(S): Desyrel What should I tell my health care provider before I take this medicine? They need to know if you have any of these conditions: -attempted suicide or thinking about it -bipolar disorder -bleeding problems -glaucoma -heart disease, or previous heart attack -irregular heart beat -kidney or liver disease -low levels of sodium in the blood -an unusual or allergic reaction to trazodone, other medicines, foods, dyes or preservatives -pregnant or trying to get pregnant -breast-feeding How should I use this medicine? Take this medicine by mouth with a glass of water. Follow the directions on the prescription label. Take this medicine shortly after a meal or a light snack. Take your medicine at regular intervals. Do not take your medicine more often than directed. Do not stop taking this medicine suddenly except upon the advice of your doctor. Stopping this medicine too quickly may cause serious side effects or your condition may worsen. A special MedGuide will be given to you by the pharmacist with each prescription and refill. Be sure to read this information carefully each time. Talk to your pediatrician regarding the use of this medicine in children. Special care may be needed. Overdosage: If you think you have taken too much of this medicine contact a poison control center or emergency room at once. NOTE: This medicine is only for you. Do not share this medicine with others. What if I miss a dose? If you miss a dose, take it as soon as you can. If it is almost time for your next dose, take only that dose. Do not take double or extra doses. What may interact with this medicine? Do not take this medicine with any of the following medications: -certain medicines  for fungal infections like fluconazole, itraconazole, ketoconazole, posaconazole, voriconazole -cisapride -dofetilide -dronedarone -linezolid -MAOIs like Carbex, Eldepryl, Marplan, Nardil, and Parnate -mesoridazine -methylene blue (injected into a vein) -pimozide -saquinavir -thioridazine -ziprasidone This medicine may also interact with the following medications: -alcohol -antiviral medicines for HIV or AIDS -aspirin and aspirin-like medicines -barbiturates like phenobarbital -certain medicines for blood pressure, heart disease, irregular heart beat -certain medicines for depression, anxiety, or psychotic disturbances -certain medicines for migraine headache like almotriptan, eletriptan, frovatriptan, naratriptan, rizatriptan, sumatriptan, zolmitriptan -certain medicines for seizures like carbamazepine and phenytoin -certain medicines for sleep -certain medicines that treat or prevent blood clots like dalteparin, enoxaparin, warfarin -digoxin -fentanyl -lithium -NSAIDS, medicines for pain and inflammation, like ibuprofen or naproxen -other medicines that prolong the QT interval (cause an abnormal heart rhythm) -rasagiline -supplements like St. John's wort, kava kava, valerian -tramadol -tryptophan This list may not describe all possible interactions. Give your health care provider a list of all the medicines, herbs, non-prescription drugs, or dietary supplements you use. Also tell them if you smoke, drink alcohol, or use illegal drugs. Some items may interact with your medicine. What should I watch for while using this medicine? Tell your doctor if your symptoms do not get better or if they get worse. Visit your doctor or health care professional for regular checks on your progress. Because it may take several weeks to see the full effects of this medicine, it is important to continue your treatment as prescribed by your doctor. Patients and their families should watch out for new  or worsening thoughts of suicide or depression. Also   watch out for sudden changes in feelings such as feeling anxious, agitated, panicky, irritable, hostile, aggressive, impulsive, severely restless, overly excited and hyperactive, or not being able to sleep. If this happens, especially at the beginning of treatment or after a change in dose, call your health care professional. Dennis Bast may get drowsy or dizzy. Do not drive, use machinery, or do anything that needs mental alertness until you know how this medicine affects you. Do not stand or sit up quickly, especially if you are an older patient. This reduces the risk of dizzy or fainting spells. Alcohol may interfere with the effect of this medicine. Avoid alcoholic drinks. This medicine may cause dry eyes and blurred vision. If you wear contact lenses you may feel some discomfort. Lubricating drops may help. See your eye doctor if the problem does not go away or is severe. Your mouth may get dry. Chewing sugarless gum, sucking hard candy and drinking plenty of water may help. Contact your doctor if the problem does not go away or is severe. What side effects may I notice from receiving this medicine? Side effects that you should report to your doctor or health care professional as soon as possible: -allergic reactions like skin rash, itching or hives, swelling of the face, lips, or tongue -elevated mood, decreased need for sleep, racing thoughts, impulsive behavior -confusion -fast, irregular heartbeat -feeling faint or lightheaded, falls -feeling agitated, angry, or irritable -loss of balance or coordination -painful or prolonged erections -restlessness, pacing, inability to keep still -suicidal thoughts or other mood changes -tremors -trouble sleeping -seizures -unusual bleeding or bruising Side effects that usually do not require medical attention (report to your doctor or health care professional if they continue or are bothersome): -change in  sex drive or performance -change in appetite or weight -constipation -headache -muscle aches or pains -nausea This list may not describe all possible side effects. Call your doctor for medical advice about side effects. You may report side effects to FDA at 1-800-FDA-1088. Where should I keep my medicine? Keep out of the reach of children. Store at room temperature between 15 and 30 degrees C (59 to 86 degrees F). Protect from light. Keep container tightly closed. Throw away any unused medicine after the expiration date. NOTE: This sheet is a summary. It may not cover all possible information. If you have questions about this medicine, talk to your doctor, pharmacist, or health care provider.  2018 Elsevier/Gold Standard (2016-01-29 16:57:05) Propranolol extended-release capsules What is this medicine? PROPRANOLOL (proe PRAN oh lole) is a beta-blocker. Beta-blockers reduce the workload on the heart and help it to beat more regularly. This medicine is used to treat high blood pressure, heart muscle disease, and prevent chest pain caused by angina. It is also used to prevent migraine headaches. You should not use this medicine to treat a migraine that has already started. This medicine may be used for other purposes; ask your health care provider or pharmacist if you have questions. COMMON BRAND NAME(S): Inderal LA, Inderal XL, InnoPran XL What should I tell my health care provider before I take this medicine? They need to know if you have any of these conditions: -circulation problems, or blood vessel disease -diabetes -history of heart attack or heart disease, vasospastic angina -kidney disease -liver disease -lung or breathing disease, like asthma or emphysema -pheochromocytoma -slow heart rate -thyroid disease -an unusual or allergic reaction to propranolol, other beta-blockers, medicines, foods, dyes, or preservatives -pregnant or trying to get pregnant -breast-feeding How should  I  use this medicine? Take this medicine by mouth with a glass of water. Follow the directions on the prescription label. Do not crush or chew. Take your doses at regular intervals. Do not take your medicine more often than directed. Do not stop taking except on the advice of your doctor or health care professional. Talk to your pediatrician regarding the use of this medicine in children. Special care may be needed. Overdosage: If you think you have taken too much of this medicine contact a poison control center or emergency room at once. NOTE: This medicine is only for you. Do not share this medicine with others. What if I miss a dose? If you miss a dose, take it as soon as you can. If it is almost time for your next dose, take only that dose. Do not take double or extra doses. What may interact with this medicine? Do not take this medicine with any of the following medications: -feverfew -phenothiazines like chlorpromazine, mesoridazine, prochlorperazine, thioridazine This medicine may also interact with the following medications: -aluminum hydroxide gel -antipyrine -antiviral medicines for HIV or AIDS -barbiturates like phenobarbital -certain medicines for blood pressure, heart disease, irregular heart beat -cimetidine -ciprofloxacin -diazepam -fluconazole -haloperidol -isoniazid -medicines for cholesterol like cholestyramine or colestipol -medicines for mental depression -medicines for migraine headache like almotriptan, eletriptan, frovatriptan, naratriptan, rizatriptan, sumatriptan, zolmitriptan -NSAIDs, medicines for pain and inflammation, like ibuprofen or naproxen -phenytoin -rifampin -teniposide -theophylline -thyroid medicines -tolbutamide -warfarin -zileuton This list may not describe all possible interactions. Give your health care provider a list of all the medicines, herbs, non-prescription drugs, or dietary supplements you use. Also tell them if you smoke, drink  alcohol, or use illegal drugs. Some items may interact with your medicine. What should I watch for while using this medicine? Visit your doctor or health care professional for regular check ups. Contact your doctor right away if your symptoms worsen. Check your blood pressure and pulse rate regularly. Ask your health care professional what your blood pressure and pulse rate should be, and when you should contact them. Do not stop taking this medicine suddenly. This could lead to serious heart-related effects. You may get drowsy or dizzy. Do not drive, use machinery, or do anything that needs mental alertness until you know how this drug affects you. Do not stand or sit up quickly, especially if you are an older patient. This reduces the risk of dizzy or fainting spells. Alcohol can make you more drowsy and dizzy. Avoid alcoholic drinks. This medicine can affect blood sugar levels. If you have diabetes, check with your doctor or health care professional before you change your diet or the dose of your diabetic medicine. Do not treat yourself for coughs, colds, or pain while you are taking this medicine without asking your doctor or health care professional for advice. Some ingredients may increase your blood pressure. What side effects may I notice from receiving this medicine? Side effects that you should report to your doctor or health care professional as soon as possible: -allergic reactions like skin rash, itching or hives, swelling of the face, lips, or tongue -breathing problems -changes in blood sugar -cold hands or feet -difficulty sleeping, nightmares -dry peeling skin -hallucinations -muscle cramps or weakness -slow heart rate -swelling of the legs and ankles -vomiting Side effects that usually do not require medical attention (report to your doctor or health care professional if they continue or are bothersome): -change in sex drive or performance -diarrhea -dry sore eyes -hair  loss -nausea -weak or tired This list may not describe all possible side effects. Call your doctor for medical advice about side effects. You may report side effects to FDA at 1-800-FDA-1088. Where should I keep my medicine? Keep out of the reach of children. Store at room temperature between 15 and 30 degrees C (59 and 86 degrees F). Protect from light, moisture and freezing. Keep container tightly closed. Throw away any unused medicine after the expiration date. NOTE: This sheet is a summary. It may not cover all possible information. If you have questions about this medicine, talk to your doctor, pharmacist, or health care provider.  2018 Elsevier/Gold Standard (2013-05-04 14:58:56)

## 2016-11-26 NOTE — Progress Notes (Signed)
Patient: Molly Potter Female    DOB: Jan 05, 1956   61 y.o.   MRN: [id number] Visit Date: 11/26/2016  Today's Provider: Mar Daring, PA-C   Chief Complaint  Patient presents with  . Urinary Frequency   Subjective:    HPI Patient is here today with c/o frequent urination. She reports that she is getting up at night 2-3 times to void. She also reports that she has to stop exercising 2-3 times to void. Per patient she was already having this problem before starting the amlodipine but it has increased the symptoms. She is tolerating the medication well otherwise. She would like to discontinue the medication. She is still not to goal on her blood pressure control. She has also previously been on metoprolol 25mg  BID which caused severe diarrhea. She was then switched to diltiazem which she never took because it was a "120mg  tablet when I had been on a 25mg ." So she questioned the dosing and refused to take. I tried to explain that it was a completely different class of medicine and why the dosing was different but she still refused to try, thus she was then subsequently placed on amlodipine.   Allergies  Allergen Reactions  . Effexor [Venlafaxine] Anaphylaxis  . Diazepam     Other reaction(s): Unknown  . Tramadol Nausea Only     Current Outpatient Prescriptions:  .  acetaminophen (TYLENOL) 500 MG tablet, Take 1,000 mg by mouth every 6 (six) hours as needed., Disp: , Rfl:  .  amLODipine (NORVASC) 5 MG tablet, Take 1 tablet (5 mg total) by mouth daily., Disp: 90 tablet, Rfl: 1 .  cyclobenzaprine (FLEXERIL) 5 MG tablet, Take by mouth., Disp: , Rfl:  .  meloxicam (MOBIC) 15 MG tablet, , Disp: , Rfl: 1  Review of Systems  Constitutional: Negative.   Respiratory: Negative.   Cardiovascular: Negative for chest pain, palpitations and leg swelling.  Gastrointestinal: Negative.   Endocrine: Negative for polyuria.  Genitourinary: Positive for frequency. Negative for difficulty  urinating, dysuria, enuresis, flank pain, pelvic pain, urgency, vaginal discharge and vaginal pain.  Neurological: Negative.     Social History  Substance Use Topics  . Smoking status: Never Smoker  . Smokeless tobacco: Never Used  . Alcohol use Yes     Comment: Occasional   Objective:   BP (!) 160/80 (BP Location: Right Arm, Patient Position: Sitting, Cuff Size: Normal)   Pulse 98   Temp 98 F (36.7 C) (Oral)   Resp 16   Wt 205 lb 6.4 oz (93.2 kg)   BMI 34.18 kg/m    Physical Exam  Constitutional: She appears well-developed and well-nourished. No distress.  Neck: Normal range of motion. Neck supple. No JVD present. No tracheal deviation present. No thyromegaly present.  Cardiovascular: Normal rate, regular rhythm and normal heart sounds.  Exam reveals no gallop and no friction rub.   No murmur heard. Pulmonary/Chest: Effort normal and breath sounds normal. No respiratory distress. She has no wheezes. She has no rales.  Musculoskeletal: She exhibits no edema.  Lymphadenopathy:    She has no cervical adenopathy.  Skin: She is not diaphoretic.  Psychiatric: She has a normal mood and affect. Her behavior is normal. Judgment and thought content normal.  Vitals reviewed.     Assessment & Plan:     1. Essential hypertension Will change her from amlodipine to propanolol as below. Discussed side effects. Discussed that we would have trouble treating her BP if  we did not try another beta blocker to see if diarrhea was a class effect or if it was only to the metoprolol, since she is not wanting to take diltiazem and amlodipine caused urine frequency. I discussed with her that I would not use diuretics to treat her HTN since she is already complaining of urine frequency. She voiced understanding and agreed to try propanolol. I will see her back in 4 weeks to see how she is doing and recheck her BP. - propranolol ER (INDERAL LA) 60 MG 24 hr capsule; Take 1 capsule (60 mg total) by mouth  daily.  Dispense: 30 capsule; Refill: 1  2. Urinary frequency UA was negative.  - POCT urinalysis dipstick  3. Primary insomnia Patient admits to not sleeping well stating she is sleeping 3-4 hours maybe at night and that can be broken still. She has been taking Tylenol PM and still only getting 3-4 hours at most. Will start trazodone as below and d/c tylenol pm. I will see her back in 4 weeks to recheck.  - traZODone (DESYREL) 50 MG tablet; Take 0.5-2 tablets (25-100 mg total) by mouth at bedtime as needed for sleep.  Dispense: 60 tablet; Refill: [place], PA-C  [facility]

## 2016-11-29 DIAGNOSIS — M7752 Other enthesopathy of left foot: Secondary | ICD-10-CM | POA: Diagnosis not present

## 2016-11-29 DIAGNOSIS — M722 Plantar fascial fibromatosis: Secondary | ICD-10-CM | POA: Diagnosis not present

## 2016-12-01 NOTE — Progress Notes (Deleted)
Cardiology Office Note   Date:  12/01/2016   ID:  Molly Potter, DOB Apr 17, 1956, MRN [id number]  Referring Doctor:  Mar Daring, PA-C   Cardiologist:   Wende Bushy, MD   Reason for consultation:  No chief complaint on file.     History of Present Illness: Molly Potter is a 61 y.o. female who presents for Evaluation of palpitations.  3 weeks ago, she had noticed sudden onset of feeling her heart racing. She feels that this may have been related to being anxious or nervous. She had another episode at the dentist's office prior to getting a filling done. Symptoms are moderate in severity, lasting a few minutes at a time, resolving eventually spontaneously. Not occur with exertion. No associated lightheadedness or syncope or presyncopal episodes.  In terms of blood pressure, she has noticed her blood pressure creeping up lately. 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   No chest pain, shortness of breath, lightheadedness, dizziness.  ROS:  Please see the history of present illness. Aside from mentioned under HPI, all other systems are reviewed and negative.       Past Medical History:  Diagnosis Date  . Arthritis   . Fatigue   . History of eczema   . Hyperthyroidism   . Joint pain     Past Surgical History:  Procedure Laterality Date  . ROTATOR CUFF REPAIR Right   . TUBAL LIGATION       reports that she has never smoked. She has never used smokeless tobacco. She reports that she drinks alcohol. She reports that she does not use drugs.   family history includes CAD in her mother; Diabetes in her maternal grandmother and paternal grandfather; Hypertension in her father; Lung cancer in her father; Throat cancer in her mother.   Outpatient Medications Prior to Visit  Medication Sig Dispense Refill  . acetaminophen (TYLENOL) 500 MG tablet Take 1,000 mg by mouth every 6 (six) hours as needed.     . cyclobenzaprine (FLEXERIL) 5 MG tablet Take by mouth.    . meloxicam (MOBIC) 15 MG tablet   1  . propranolol ER (INDERAL LA) 60 MG 24 hr capsule Take 1 capsule (60 mg total) by mouth daily. 30 capsule 1  . traZODone (DESYREL) 50 MG tablet Take 1-2 tablets (50-100 mg total) by mouth at bedtime as needed for sleep. 60 tablet 1   No facility-administered medications prior to visit.      Allergies: Effexor [venlafaxine]; Diazepam; and Tramadol    PHYSICAL EXAM: VS:  There were no vitals taken for this visit. , There is no height or weight on file to calculate BMI. Wt Readings from Last 3 Encounters:  11/26/16 205 lb 6.4 oz (93.2 kg)  10/22/16 203 lb (92.1 kg)  07/28/16 201 lb (91.2 kg)  GENERAL:  well developed, well nourished, obese, not in acute distress HEENT: normocephalic, pink conjunctivae, anicteric sclerae, no xanthelasma, normal dentition, oropharynx clear NECK:  no neck vein engorgement, JVP normal, no hepatojugular reflux, carotid upstroke brisk and symmetric, no bruit, no thyromegaly, no lymphadenopathy LUNGS:  good respiratory effort, clear to auscultation bilaterally CV:  PMI not displaced, no thrills, no lifts, S1 and S2 within normal limits, no palpable S3 or S4, no murmurs, no rubs, no gallops ABD:  Soft, nontender, nondistended, normoactive bowel sounds, no abdominal aortic bruit, no hepatomegaly, no splenomegaly MS: nontender back, no kyphosis, no scoliosis, no  joint deformities EXT:  2+ DP/PT pulses, no edema, no varicosities, no cyanosis, no clubbing SKIN: warm, nondiaphoretic, normal turgor, no ulcers NEUROPSYCH: alert, oriented to person, place, and time, sensory/motor grossly intact, normal mood, appropriate affect    Recent Labs: 07/15/2016: BUN 14; Creatinine, Ser 0.80; Hemoglobin 13.6; Platelets 350; Potassium 3.6; Sodium 141 07/16/2016: Magnesium 2.2   Lipid Panel No results found for: CHOL, TRIG, HDL, CHOLHDL, VLDL, LDLCALC, LDLDIRECT   Other studies  Reviewed:  EKG:  The ekg from 07/28/2016 was personally reviewed by me and it revealed sinus rhythm, 74 BPM  Additional studies/ records that were reviewed personally reviewed by me today include: None available Long term monitor 08/18/2016: Overall rhythm was sinus, heart rate ranged from 53-125, average of 82 BPM.  Four episodes of SVT, likely atrial tachycardia. Heart rate ranged from 92-167 bpm. Fastest SVT was at 07/30/2016, 8:08 PM. This was a 23 beat run, a 8.9 seconds, heart rate of 151 BPM.  No other significant supraventricular ectopy. No significant ventricular ectopy. No ventricular runs. No significant pauses. No evidence of AV block. No evidence of atrial fibrillation.  No patient events reported.  ASSESSMENT AND PLAN: Palpitations Recommend objective evaluation with long-term monitor for 2 weeks at least per she does not get any daily symptoms.  Elevated blood pressure Recommend blood pressure log. Goal is less than 140/80, closer to 130/80. Patient may follow-up with PCP for this.  Obesity There is no height or weight on file to calculate BMI.Marland Kitchen Recommend aggressive weight loss through diet and increased physical activity.     Current medicines are reviewed at length with the patient today.  The patient does not have concerns regarding medicines.  Labs/ tests ordered today include:  No orders of the defined types were placed in this encounter.   I had a lengthy and detailed discussion with the patient regarding diagnoses, prognosis, diagnostic options, treatment options , and side effects of medications.   I counseled the patient on importance of lifestyle modification including heart healthy diet, regular physical activity.   Disposition:   FU with undersigned after tests    Signed, Wende Bushy, MD  12/01/2016 1:51 PM    [place]  This note was generated in part with voice recognition software and I apologize for any  typographical errors that were not detected and corrected.

## 2016-12-02 ENCOUNTER — Ambulatory Visit: Payer: 59 | Admitting: Cardiology

## 2016-12-06 ENCOUNTER — Telehealth: Payer: Self-pay | Admitting: Physician Assistant

## 2016-12-06 DIAGNOSIS — I10 Essential (primary) hypertension: Secondary | ICD-10-CM

## 2016-12-06 MED ORDER — PROPRANOLOL HCL ER 120 MG PO CP24: 120.0000 mg | ORAL_CAPSULE | Freq: Every day | ORAL | 5 refills | Status: DC

## 2016-12-06 MED ORDER — AMLODIPINE BESYLATE 10 MG PO TABS: 10.0000 mg | ORAL_TABLET | Freq: Every day | ORAL | 5 refills | Status: DC

## 2016-12-06 NOTE — Telephone Encounter (Signed)
She did not take the Propanol, She is wanting to stay on the amlodipine, It was d/c'd last OV. She refuses to take Propanolol because it is a time release capsule. She wants amlodipine increased.

## 2016-12-06 NOTE — Telephone Encounter (Signed)
Pt called wanting to know if you would increase the bp rx she is already on rather than change her rx completely.  Pt's call back is [phone]  [place]

## 2016-12-06 NOTE — Addendum Note (Signed)
Addended by: Mar Daring on: 12/06/2016 03:34 PM   Modules accepted: Orders

## 2016-12-06 NOTE — Telephone Encounter (Signed)
Amlodipine 10mg  sent to [place]. Can we call to cancel the propranolol please.

## 2016-12-06 NOTE — Telephone Encounter (Signed)
If she is tolerating this one well but is still having elevated readings, yes we can increase what she is currently taking.

## 2016-12-06 NOTE — Telephone Encounter (Signed)
Would like to increase what she is taking. She used [facility] [address].

## 2016-12-06 NOTE — Telephone Encounter (Signed)
Please review-aa 

## 2016-12-06 NOTE — Telephone Encounter (Signed)
Propranolol ER increased from 60mg  to 120mg  and sent to [place]

## 2016-12-14 DIAGNOSIS — M170 Bilateral primary osteoarthritis of knee: Secondary | ICD-10-CM | POA: Diagnosis not present

## 2016-12-21 DIAGNOSIS — M170 Bilateral primary osteoarthritis of knee: Secondary | ICD-10-CM | POA: Diagnosis not present

## 2016-12-24 ENCOUNTER — Encounter: Payer: Self-pay | Admitting: Physician Assistant

## 2016-12-24 ENCOUNTER — Ambulatory Visit (INDEPENDENT_AMBULATORY_CARE_PROVIDER_SITE_OTHER): Payer: BLUE CROSS/BLUE SHIELD | Admitting: Physician Assistant

## 2016-12-24 VITALS — BP 148/86 | HR 90 | Temp 98.0°F | Resp 16 | Wt 207.8 lb

## 2016-12-24 DIAGNOSIS — I10 Essential (primary) hypertension: Secondary | ICD-10-CM

## 2016-12-24 DIAGNOSIS — T7840XA Allergy, unspecified, initial encounter: Secondary | ICD-10-CM | POA: Diagnosis not present

## 2016-12-24 NOTE — Progress Notes (Signed)
Patient: Molly Potter Female    DOB: 1956-05-01   61 y.o.   MRN: [id number] Visit Date: 12/27/2016  Today's Provider: Mar Daring, PA-C   Chief Complaint  Patient presents with  . Hypertension   Subjective:    HPI  Hypertension, follow-up:  BP Readings from Last 3 Encounters:  12/24/16 (!) 148/86  11/26/16 (!) 160/80  10/22/16 (!) 142/78    She was last seen for hypertension 4 weeks ago.  BP at that visit was 160/80. Management since that visit includes Amlodipine was switched to Propanolol, but patient didn't take and requested to go back on Amlodipine. She is on Amlodipine 10 mg. She reports excellent compliance with treatment. She is not having side effects.  She is exercising. Water Aerobics once a week She is not adherent to low salt diet.   Outside blood pressures are 120's/60's.She reports that her BP this morning was 134/87. She is experiencing none.  Patient denies chest pain, chest pressure/discomfort, exertional chest pressure/discomfort, fatigue, irregular heart beat and near-syncope.   Cardiovascular risk factors include hypertension.   Weight trend: stable Wt Readings from Last 3 Encounters:  12/24/16 207 lb 12.8 oz (94.3 kg)  11/26/16 205 lb 6.4 oz (93.2 kg)  10/22/16 203 lb (92.1 kg)    Current diet: in general, an "unhealthy" diet  ------------------------------------------------------------------------ Patient reports that she knows that she had an allergic reaction to the Synvisc injection she got on her knee. She states "people probably think I am crazy" because of the symptoms she was having. She reports that with the injection she started to have back, legs, neck, hand burning. She reports that she had this feeling with the first injection, but then it eased off.  With the second injection it didn't subside until she took benadryl. She is not doing the third injection and the Ortho doctor's nurse was advised. They advised the  patient to take Benadryl and she reports that she feels better.    Allergies  Allergen Reactions  . Effexor [Venlafaxine] Anaphylaxis  . Diazepam     Other reaction(s): Unknown  . Synvisc [Hylan G-F 20] Other (See Comments)    Myalgias, hand pain, back pain; Benadryl helped pain  . Tramadol Nausea Only     Current Outpatient Prescriptions:  .  acetaminophen (TYLENOL) 500 MG tablet, Take 1,000 mg by mouth every 6 (six) hours as needed., Disp: , Rfl:  .  amLODipine (NORVASC) 10 MG tablet, Take 1 tablet (10 mg total) by mouth daily., Disp: 30 tablet, Rfl: 5 .  cyclobenzaprine (FLEXERIL) 5 MG tablet, Take by mouth., Disp: , Rfl:  .  meloxicam (MOBIC) 15 MG tablet, , Disp: , Rfl: 1 .  traZODone (DESYREL) 50 MG tablet, Take 1-2 tablets (50-100 mg total) by mouth at bedtime as needed for sleep., Disp: 60 tablet, Rfl: 1  Review of Systems  Constitutional: Negative.   Respiratory: Negative.   Cardiovascular: Negative.  Negative for chest pain, palpitations and leg swelling.  Gastrointestinal: Negative.   Neurological: Negative.     Social History  Substance Use Topics  . Smoking status: Never Smoker  . Smokeless tobacco: Never Used  . Alcohol use Yes     Comment: Occasional   Objective:   BP (!) 148/86 (BP Location: Left Arm, Patient Position: Sitting, Cuff Size: Normal)   Pulse 90   Temp 98 F (36.7 C) (Oral)   Resp 16   Wt 207 lb 12.8 oz (94.3 kg)  BMI 34.58 kg/m    Physical Exam  Constitutional: She appears well-developed and well-nourished. No distress.  Neck: Normal range of motion. Neck supple. No JVD present. No tracheal deviation present. No thyromegaly present.  Cardiovascular: Normal rate, regular rhythm and normal heart sounds.  Exam reveals no gallop and no friction rub.   No murmur heard. Pulmonary/Chest: Effort normal and breath sounds normal. No respiratory distress. She has no wheezes. She has no rales.  Musculoskeletal: She exhibits no edema.    Lymphadenopathy:    She has no cervical adenopathy.  Skin: She is not diaphoretic.  Vitals reviewed.      Assessment & Plan:     1. Essential hypertension Continue amlodipine 10mg  daily.   2. Allergic reaction to drug, initial encounter Caused by Synvisc injections to the right knee. Improving with Benadryl. Followed by Vance Peper, PA-C.        Mar Daring, PA-C  [facility]

## 2017-01-19 ENCOUNTER — Encounter: Payer: Self-pay | Admitting: Physician Assistant

## 2017-01-19 ENCOUNTER — Ambulatory Visit (INDEPENDENT_AMBULATORY_CARE_PROVIDER_SITE_OTHER): Payer: BLUE CROSS/BLUE SHIELD | Admitting: Physician Assistant

## 2017-01-19 VITALS — BP 148/86 | HR 91 | Temp 98.2°F | Wt 210.0 lb

## 2017-01-19 DIAGNOSIS — I10 Essential (primary) hypertension: Secondary | ICD-10-CM

## 2017-01-19 NOTE — Progress Notes (Signed)
   Patient: Molly Potter Female    DOB: Mar 14, 1956   61 y.o.   MRN: [id number] Visit Date: 01/20/2017  Today's Provider: Mar Daring, PA-C   Chief Complaint  Patient presents with  . Hypertension  . Follow-up   Subjective:    HPI  Hypertension, follow-up:  BP Readings from Last 3 Encounters:  01/19/17 (!) 148/86  12/24/16 (!) 148/86  11/26/16 (!) 160/80    She was last seen for hypertension 5 weeks ago.  BP at that visit was 148/86. Management changes since that visit include continue medication. She reports poor compliance with treatment. Stopped taking Amlodipine 2 days ago. She is not having side effects.  She is not exercising due to knee and ankle pain. She is not adherent to low salt diet.   Outside blood pressures are being checked. She is experiencing lower extremity edema.  Patient denies chest pain, chest pressure/discomfort, irregular heart beat and palpitations.   Cardiovascular risk factors include hypertension and obesity (BMI >= 30 kg/m2).  Use of agents associated with hypertension: none.     Weight trend: stable Wt Readings from Last 3 Encounters:  01/19/17 210 lb (95.3 kg)  12/24/16 207 lb 12.8 oz (94.3 kg)  11/26/16 205 lb 6.4 oz (93.2 kg)    Current diet: in general, an "unhealthy" diet ------------------------------------------------------------------------    Previous Medications   ACETAMINOPHEN (TYLENOL) 500 MG TABLET    Take 1,000 mg by mouth every 6 (six) hours as needed.   CYCLOBENZAPRINE (FLEXERIL) 5 MG TABLET    Take by mouth.   MELOXICAM (MOBIC) 15 MG TABLET       TRAZODONE (DESYREL) 50 MG TABLET    Take 1-2 tablets (50-100 mg total) by mouth at bedtime as needed for sleep.    Review of Systems  Constitutional: Negative.   Respiratory: Negative.   Cardiovascular: Negative.   Gastrointestinal: Negative.   Neurological: Negative.     Social History  Substance Use Topics  . Smoking status: Never Smoker  . Smokeless  tobacco: Never Used  . Alcohol use Yes     Comment: Occasional   Objective:   BP (!) 148/86 (BP Location: Right Arm, Patient Position: Sitting, Cuff Size: Normal)   Pulse 91   Temp 98.2 F (36.8 C) (Oral)   Wt 210 lb (95.3 kg)   SpO2 96%   BMI 34.95 kg/m   Physical Exam  Constitutional: She appears well-developed and well-nourished. No distress.  Neck: Normal range of motion. Neck supple. No tracheal deviation present. No thyromegaly present.  Cardiovascular: Normal rate, regular rhythm and normal heart sounds.  Exam reveals no gallop and no friction rub.   No murmur heard. Pulmonary/Chest: Effort normal and breath sounds normal. No respiratory distress. She has no wheezes. She has no rales.  Musculoskeletal: She exhibits no edema.  Lymphadenopathy:    She has no cervical adenopathy.  Skin: She is not diaphoretic.  Vitals reviewed.      Assessment & Plan:     1. Essential hypertension Will discontinue amlodipine due to side effects of lower extremity edema. This has been improving since D/C. Possible white coat hypertension as home readings have been much better. Advised patient to f/u in 4 weeks and bring BP cuff for [place] to compare to see if BP at home readings are accurate.    Follow up: Return in about 4 weeks (around 02/16/2017) for HTN.

## 2017-01-19 NOTE — Patient Instructions (Addendum)
Bring BP cuff to next appointment  DASH Eating Plan DASH stands for "Dietary Approaches to Stop Hypertension." The DASH eating plan is a healthy eating plan that has been shown to reduce high blood pressure (hypertension). It may also reduce your risk for type 2 diabetes, heart disease, and stroke. The DASH eating plan may also help with weight loss. What are tips for following this plan? General guidelines   Avoid eating more than 2,300 mg (milligrams) of salt (sodium) a day. If you have hypertension, you may need to reduce your sodium intake to 1,500 mg a day.  Limit alcohol intake to no more than 1 drink a day for nonpregnant women and 2 drinks a day for men. One drink equals 12 oz of beer, 5 oz of wine, or 1 oz of hard liquor.  Work with your health care provider to maintain a healthy body weight or to lose weight. Ask what an ideal weight is for you.  Get at least 30 minutes of exercise that causes your heart to beat faster (aerobic exercise) most days of the week. Activities may include walking, swimming, or biking.  Work with your health care provider or diet and nutrition specialist (dietitian) to adjust your eating plan to your individual calorie needs. Reading food labels   Check food labels for the amount of sodium per serving. Choose foods with less than 5 percent of the Daily Value of sodium. Generally, foods with less than 300 mg of sodium per serving fit into this eating plan.  To find whole grains, look for the word "whole" as the first word in the ingredient list. Shopping   Buy products labeled as "low-sodium" or "no salt added."  Buy fresh foods. Avoid canned foods and premade or frozen meals. Cooking   Avoid adding salt when cooking. Use salt-free seasonings or herbs instead of table salt or sea salt. Check with your health care provider or pharmacist before using salt substitutes.  Do not fry foods. Cook foods using healthy methods such as baking, boiling,  grilling, and broiling instead.  Cook with heart-healthy oils, such as olive, canola, soybean, or sunflower oil. Meal planning    Eat a balanced diet that includes:  5 or more servings of fruits and vegetables each day. At each meal, try to fill half of your plate with fruits and vegetables.  Up to 6-8 servings of whole grains each day.  Less than 6 oz of lean meat, poultry, or fish each day. A 3-oz serving of meat is about the same size as a deck of cards. One egg equals 1 oz.  2 servings of low-fat dairy each day.  A serving of nuts, seeds, or beans 5 times each week.  Heart-healthy fats. Healthy fats called Omega-3 fatty acids are found in foods such as flaxseeds and coldwater fish, like sardines, salmon, and mackerel.  Limit how much you eat of the following:  Canned or prepackaged foods.  Food that is high in trans fat, such as fried foods.  Food that is high in saturated fat, such as fatty meat.  Sweets, desserts, sugary drinks, and other foods with added sugar.  Full-fat dairy products.  Do not salt foods before eating.  Try to eat at least 2 vegetarian meals each week.  Eat more home-cooked food and less restaurant, buffet, and fast food.  When eating at a restaurant, ask that your food be prepared with less salt or no salt, if possible. What foods are recommended? The items listed  may not be a complete list. Talk with your dietitian about what dietary choices are best for you. Grains  Whole-grain or whole-wheat bread. Whole-grain or whole-wheat pasta. Brown rice. [place]. Bulgur. Whole-grain and low-sodium cereals. Pita bread. Low-fat, low-sodium crackers. Whole-wheat flour tortillas. Vegetables  Fresh or frozen vegetables (raw, steamed, roasted, or grilled). Low-sodium or reduced-sodium tomato and vegetable juice. Low-sodium or reduced-sodium tomato sauce and tomato paste. Low-sodium or reduced-sodium canned vegetables. Fruits  All fresh, dried, or  frozen fruit. Canned fruit in natural juice (without added sugar). Meat and other protein foods  Skinless chicken or [place]. Ground chicken or [place]. Pork with fat trimmed off. Fish and seafood. Egg whites. Dried beans, peas, or lentils. Unsalted nuts, nut butters, and seeds. Unsalted canned beans. Lean cuts of beef with fat trimmed off. Low-sodium, lean deli meat. Dairy  Low-fat (1%) or fat-free (skim) milk. Fat-free, low-fat, or reduced-fat cheeses. Nonfat, low-sodium ricotta or cottage cheese. Low-fat or nonfat yogurt. Low-fat, low-sodium cheese. Fats and oils  Soft margarine without trans fats. Vegetable oil. Low-fat, reduced-fat, or light mayonnaise and salad dressings (reduced-sodium). Canola, safflower, olive, soybean, and sunflower oils. Avocado. Seasoning and other foods  Herbs. Spices. Seasoning mixes without salt. Unsalted popcorn and pretzels. Fat-free sweets. What foods are not recommended? The items listed may not be a complete list. Talk with your dietitian about what dietary choices are best for you. Grains  Baked goods made with fat, such as croissants, muffins, or some breads. Dry pasta or rice meal packs. Vegetables  Creamed or fried vegetables. Vegetables in a cheese sauce. Regular canned vegetables (not low-sodium or reduced-sodium). Regular canned tomato sauce and paste (not low-sodium or reduced-sodium). Regular tomato and vegetable juice (not low-sodium or reduced-sodium). Angie Fava. Olives. Fruits  Canned fruit in a light or heavy syrup. Fried fruit. Fruit in cream or butter sauce. Meat and other protein foods  Fatty cuts of meat. Ribs. Fried meat. Berniece Salines. Sausage. Bologna and other processed lunch meats. Salami. Fatback. Hotdogs. Bratwurst. Salted nuts and seeds. Canned beans with added salt. Canned or smoked fish. Whole eggs or egg yolks. Chicken or [place] with skin. Dairy  Whole or 2% milk, cream, and half-and-half. Whole or full-fat cream cheese. Whole-fat or sweetened  yogurt. Full-fat cheese. Nondairy creamers. Whipped toppings. Processed cheese and cheese spreads. Fats and oils  Butter. Stick margarine. Lard. Shortening. Ghee. Bacon fat. Tropical oils, such as coconut, palm kernel, or palm oil. Seasoning and other foods  Salted popcorn and pretzels. Onion salt, garlic salt, seasoned salt, table salt, and sea salt. Worcestershire sauce. Tartar sauce. Barbecue sauce. Teriyaki sauce. Soy sauce, including reduced-sodium. Steak sauce. Canned and packaged gravies. Fish sauce. Oyster sauce. Cocktail sauce. Horseradish that you find on the shelf. Ketchup. Mustard. Meat flavorings and tenderizers. Bouillon cubes. Hot sauce and Tabasco sauce. Premade or packaged marinades. Premade or packaged taco seasonings. Relishes. Regular salad dressings. Where to find more information:  National Heart, Lung, and [place]: https://wilson-eaton.com/  American Heart Association: www.heart.org Summary  The DASH eating plan is a healthy eating plan that has been shown to reduce high blood pressure (hypertension). It may also reduce your risk for type 2 diabetes, heart disease, and stroke.  With the DASH eating plan, you should limit salt (sodium) intake to 2,300 mg a day. If you have hypertension, you may need to reduce your sodium intake to 1,500 mg a day.  When on the DASH eating plan, aim to eat more fresh fruits and vegetables, whole grains, lean proteins,  low-fat dairy, and heart-healthy fats.  Work with your health care provider or diet and nutrition specialist (dietitian) to adjust your eating plan to your individual calorie needs. This information is not intended to replace advice given to you by your health care provider. Make sure you discuss any questions you have with your health care provider. Document Released: 08/19/2011 Document Revised: 08/23/2016 Document Reviewed: 08/23/2016 Elsevier Interactive Patient Education  2017 [employer].

## 2017-02-16 ENCOUNTER — Ambulatory Visit (INDEPENDENT_AMBULATORY_CARE_PROVIDER_SITE_OTHER): Payer: BLUE CROSS/BLUE SHIELD | Admitting: Physician Assistant

## 2017-02-16 ENCOUNTER — Encounter: Payer: Self-pay | Admitting: Physician Assistant

## 2017-02-16 VITALS — BP 140/88 | HR 88 | Temp 98.0°F | Wt 209.4 lb

## 2017-02-16 DIAGNOSIS — Z6834 Body mass index (BMI) 34.0-34.9, adult: Secondary | ICD-10-CM | POA: Diagnosis not present

## 2017-02-16 DIAGNOSIS — M170 Bilateral primary osteoarthritis of knee: Secondary | ICD-10-CM | POA: Diagnosis not present

## 2017-02-16 DIAGNOSIS — I10 Essential (primary) hypertension: Secondary | ICD-10-CM

## 2017-02-16 MED ORDER — DICLOFENAC SODIUM 1 % TD GEL: 4.0000 g | Freq: Four times a day (QID) | TRANSDERMAL | 3 refills | Status: DC

## 2017-02-16 NOTE — Patient Instructions (Signed)
Diclofenac sodium topical solution What is this medicine? DICLOFENAC (dye KLOE fen ak) is a non-steroidal anti-inflammatory drug (NSAID). It is used to treat osteoarthritis of the knees. This medicine may be used for other purposes; ask your health care provider or pharmacist if you have questions. COMMON BRAND NAME(S): INFLAMMA-K, PENNSAID, VOPAC MDS What should I tell my health care provider before I take this medicine? They need to know if you have any of these conditions: -asthma -bleeding problems -coronary artery bypass graft (CABG) surgery within the past 2 weeks -heart disease -high blood pressure -if you frequently drink alcohol containing drinks -kidney disease -liver disease -open or infected skin -stomach problems -an unusual or allergic reaction to diclofenac, aspirin, other NSAIDs, other medicines, foods, dyes, or preservatives -pregnant or trying to get pregnant -breast-feeding How should I use this medicine? This medicine is for external use only. Follow the directions on the prescription label. Wash hands before and after use. Apply to the clean, dry skin of the knee. Rub around front, back, and sides of the knee. Do not apply to open wounds, infections, swelling, or areas of exfoliative dermatitis. Allow medicine to dry before using any other lotion or medicine on the same place. Do not get this medicine in your mouth or eyes. If this medicine gets in your eye, rinse out with plenty of cool tap water. Protect treatment area from sunlight and sun lamps. Use your doses at regular intervals. Do not use it more often than directed. A special MedGuide will be given to you by the pharmacist with each prescription and refill. Be sure to read this information carefully each time. Talk to your pediatrician regarding the use of this medicine in children. Special care may be needed. Overdosage: If you think you have taken too much of this medicine contact a poison control center or  emergency room at once. NOTE: This medicine is only for you. Do not share this medicine with others. What if I miss a dose? If you miss a dose, use it as soon as you can. If it is almost time for your next dose, use only that dose. Do not use double or extra doses. What may interact with this medicine? Do not take this medicine with any of the following medications: -cidofovir -ketorolac -methotrexate This medicine may also interact with the following medications: -aspirin -cyclosporine -lithium -medicines for blood pressure -medicines that treat or prevent blood clots like warfarin, enoxaparin, and dalteparin -NSAIDs, medicines for pain and inflammation, like ibuprofen or naproxen -other products used on the skin -steroid medicines like prednisone or cortisone This list may not describe all possible interactions. Give your health care provider a list of all the medicines, herbs, non-prescription drugs, or dietary supplements you use. Also tell them if you smoke, drink alcohol, or use illegal drugs. Some items may interact with your medicine. What should I watch for while using this medicine? Tell your doctor or healthcare professional if your symptoms do not start to get better or if they get worse. This medicine can make you more sensitive to the sun. Keep out of the sun. If you cannot avoid being in the sun, wear protective clothing and use sunscreen. Do not use sun lamps or tanning beds/booths. Do not take medicines such as ibuprofen and naproxen with this medicine. Side effects such as stomach upset, nausea, or ulcers may be more likely to occur. Many medicines available without a prescription should not be taken with this medicine. This medicine does not prevent   heart attack or stroke. In fact, this medicine may increase the chance of a heart attack or stroke. The chance may increase with longer use of this medicine and in people who have heart disease. If you take aspirin to prevent  heart attack or stroke, talk with your doctor or health care professional. This medicine can cause ulcers and bleeding in the stomach and intestines at any time during treatment. Ulcers and bleeding can happen without warning symptoms and can cause death. To reduce your risk, do not smoke cigarettes or drink alcohol while you are taking this medicine. This medicine can cause you to bleed more easily. Try to avoid damage to your teeth and gums when you brush or floss your teeth. What side effects may I notice from receiving this medicine? Side effects that you should report to your doctor or health care professional as soon as possible: -allergic reactions like skin rash, itching or hives, swelling of the face, lips, or tongue -black or bloody stools, blood in the urine or vomit -blurred vision -chest pain -difficulty breathing or wheezing -nausea or vomiting -redness, blistering, peeling or loosening of the skin, including inside the mouth -slurred speech or weakness on one side of the body -trouble passing urine or change in the amount of urine -unexplained weight gain or swelling -unusually weak or tired -yellowing of eyes or skin Side effects that usually do not require medical attention (report to your doctor or health care professional if they continue or are bothersome): -dizziness -dry skin -headache -increased sensitivity to the sun -stomach upset -tingling at the application This list may not describe all possible side effects. Call your doctor for medical advice about side effects. You may report side effects to FDA at 1-800-FDA-1088. Where should I keep my medicine? Keep out of the reach of children. Store at room temperature between 15 and 30 degrees C (59 and 86 degrees F). Keep container tightly closed. Throw away any unused medicine after the expiration date. NOTE: This sheet is a summary. It may not cover all possible information. If you have questions about this medicine,  talk to your doctor, pharmacist, or health care provider.  2018 Elsevier/Gold Standard (2015-10-02 09:11:01)  

## 2017-02-16 NOTE — Progress Notes (Signed)
Patient: MADELEIN MAHADEO Female    DOB: 01-01-1956   61 y.o.   MRN: [id number] Visit Date: 02/16/2017  Today's Provider: Mar Daring, PA-C   Chief Complaint  Patient presents with  . Hypertension  . Follow-up   Subjective:    HPI  Hypertension, follow-up:  BP Readings from Last 3 Encounters:  02/16/17 140/88  01/19/17 (!) 148/86  12/24/16 (!) 148/86    She was last seen for hypertension 1 months ago.  BP at that visit was 148/86. Management changes since that visit include discontinued Amlodipine. She reports good compliance with treatment. She is not having side effects.  She is not exercising due to knee and ankle pain. She is not adherent to low salt diet.   Outside blood pressures are not being checked. She is experiencing lower extremity edema.  Patient denies chest pain, chest pressure/discomfort, irregular heart beat and palpitations.   Cardiovascular risk factors include hypertension and obesity (BMI >= 30 kg/m2).  Use of agents associated with hypertension: none.     Weight trend: stable Wt Readings from Last 3 Encounters:  02/16/17 209 lb 6.4 oz (95 kg)  01/19/17 210 lb (95.3 kg)  12/24/16 207 lb 12.8 oz (94.3 kg)    Current diet: in general, an "unhealthy" diet  She does continue to have knee pain bilaterally that radiates to her lower legs. She has been using her compression stockings that is helping some. She has been seen by orthopedics and was started on the hylauronic acid shots but had an adverse reaction.  ------------------------------------------------------------------------    Previous Medications   ACETAMINOPHEN (TYLENOL) 500 MG TABLET    Take 1,000 mg by mouth every 6 (six) hours as needed.   CYCLOBENZAPRINE (FLEXERIL) 5 MG TABLET    Take by mouth.   MELOXICAM (MOBIC) 15 MG TABLET       TRAZODONE (DESYREL) 50 MG TABLET    Take 1-2 tablets (50-100 mg total) by mouth at bedtime as needed for sleep.    Review of Systems    Constitutional: Negative.   Respiratory: Negative.   Cardiovascular: Positive for leg swelling. Negative for chest pain and palpitations.  Gastrointestinal: Negative.   Neurological: Negative.     Social History  Substance Use Topics  . Smoking status: Never Smoker  . Smokeless tobacco: Never Used  . Alcohol use Yes     Comment: Occasional   Objective:   BP 140/88 (BP Location: Left Arm, Patient Position: Sitting, Cuff Size: Normal)   Pulse 88   Temp 98 F (36.7 C) (Oral)   Wt 209 lb 6.4 oz (95 kg)   SpO2 95%   BMI 34.85 kg/m   Physical Exam  Constitutional: She appears well-developed and well-nourished. No distress.  Neck: Normal range of motion. Neck supple. No JVD present. No tracheal deviation present. No thyromegaly present.  Cardiovascular: Normal rate, regular rhythm and normal heart sounds.  Exam reveals no gallop and no friction rub.   No murmur heard. Pulmonary/Chest: Effort normal and breath sounds normal. No respiratory distress. She has no wheezes. She has no rales.  Musculoskeletal: She exhibits edema (trace non pitting edema).  Lymphadenopathy:    She has no cervical adenopathy.  Skin: She is not diaphoretic.  Vitals reviewed.       Assessment & Plan:     1. Essential hypertension BP stable at 140/88. Patient denies any symptoms and states she is feeling well off the medication. She is not interested in starting any BP  medication at this time and will call if her BP starts running higher or if she has symptoms. I will see her back in 3 months for CPE and labs.  2. Primary osteoarthritis of both knees Will add voltaren gel as below for her to try to see if this will help her symptoms. She is to call if symptoms worsen.  - diclofenac sodium (VOLTAREN) 1 % GEL; Apply 4 g topically 4 (four) times daily. To knees bilaterally as needed  Dispense: 100 g; Refill: 3  3. BMI 34.0-34.9,adult Counseled patient on healthy lifestyle modifications including dieting  and exercise.    Follow up: No Follow-up on file.

## 2017-05-02 DIAGNOSIS — H524 Presbyopia: Secondary | ICD-10-CM | POA: Diagnosis not present

## 2017-05-19 ENCOUNTER — Encounter: Payer: BLUE CROSS/BLUE SHIELD | Admitting: Physician Assistant

## 2017-06-14 ENCOUNTER — Encounter: Payer: Self-pay | Admitting: Physician Assistant

## 2017-06-15 ENCOUNTER — Ambulatory Visit (INDEPENDENT_AMBULATORY_CARE_PROVIDER_SITE_OTHER): Payer: BLUE CROSS/BLUE SHIELD | Admitting: Physician Assistant

## 2017-06-15 ENCOUNTER — Encounter: Payer: Self-pay | Admitting: Physician Assistant

## 2017-06-15 VITALS — BP 130/82 | HR 84 | Temp 98.0°F | Resp 16 | Ht 65.0 in | Wt 200.8 lb

## 2017-06-15 DIAGNOSIS — Z0000 Encounter for general adult medical examination without abnormal findings: Secondary | ICD-10-CM | POA: Diagnosis not present

## 2017-06-15 DIAGNOSIS — Z124 Encounter for screening for malignant neoplasm of cervix: Secondary | ICD-10-CM

## 2017-06-15 DIAGNOSIS — Z2821 Immunization not carried out because of patient refusal: Secondary | ICD-10-CM | POA: Diagnosis not present

## 2017-06-15 DIAGNOSIS — Z1211 Encounter for screening for malignant neoplasm of colon: Secondary | ICD-10-CM | POA: Diagnosis not present

## 2017-06-15 DIAGNOSIS — Z1239 Encounter for other screening for malignant neoplasm of breast: Secondary | ICD-10-CM

## 2017-06-15 DIAGNOSIS — E0590 Thyrotoxicosis, unspecified without thyrotoxic crisis or storm: Secondary | ICD-10-CM | POA: Diagnosis not present

## 2017-06-15 DIAGNOSIS — I10 Essential (primary) hypertension: Secondary | ICD-10-CM

## 2017-06-15 DIAGNOSIS — Z6833 Body mass index (BMI) 33.0-33.9, adult: Secondary | ICD-10-CM | POA: Diagnosis not present

## 2017-06-15 DIAGNOSIS — Z1159 Encounter for screening for other viral diseases: Secondary | ICD-10-CM

## 2017-06-15 DIAGNOSIS — Z1231 Encounter for screening mammogram for malignant neoplasm of breast: Secondary | ICD-10-CM

## 2017-06-15 LAB — IFOBT (OCCULT BLOOD): IMMUNOLOGICAL FECAL OCCULT BLOOD TEST: POSITIVE

## 2017-06-15 NOTE — Patient Instructions (Signed)
Health Maintenance for Postmenopausal Women Menopause is a normal process in which your reproductive ability comes to an end. This process happens gradually over a span of months to years, usually between the ages of 22 and 9. Menopause is complete when you have missed 12 consecutive menstrual periods. It is important to talk with your health care provider about some of the most common conditions that affect postmenopausal women, such as heart disease, cancer, and bone loss (osteoporosis). Adopting a healthy lifestyle and getting preventive care can help to promote your health and wellness. Those actions can also lower your chances of developing some of these common conditions. What should I know about menopause? During menopause, you may experience a number of symptoms, such as:  Moderate-to-severe hot flashes.  Night sweats.  Decrease in sex drive.  Mood swings.  Headaches.  Tiredness.  Irritability.  Memory problems.  Insomnia.  Choosing to treat or not to treat menopausal changes is an individual decision that you make with your health care provider. What should I know about hormone replacement therapy and supplements? Hormone therapy products are effective for treating symptoms that are associated with menopause, such as hot flashes and night sweats. Hormone replacement carries certain risks, especially as you become older. If you are thinking about using estrogen or estrogen with progestin treatments, discuss the benefits and risks with your health care provider. What should I know about heart disease and stroke? Heart disease, heart attack, and stroke become more likely as you age. This may be due, in part, to the hormonal changes that your body experiences during menopause. These can affect how your body processes dietary fats, triglycerides, and cholesterol. Heart attack and stroke are both medical emergencies. There are many things that you can do to help prevent heart disease  and stroke:  Have your blood pressure checked at least every 1-2 years. High blood pressure causes heart disease and increases the risk of stroke.  If you are 53-22 years old, ask your health care provider if you should take aspirin to prevent a heart attack or a stroke.  Do not use any tobacco products, including cigarettes, chewing tobacco, or electronic cigarettes. If you need help quitting, ask your health care provider.  It is important to eat a healthy diet and maintain a healthy weight. ? Be sure to include plenty of vegetables, fruits, low-fat dairy products, and lean protein. ? Avoid eating foods that are high in solid fats, added sugars, or salt (sodium).  Get regular exercise. This is one of the most important things that you can do for your health. ? Try to exercise for at least 150 minutes each week. The type of exercise that you do should increase your heart rate and make you sweat. This is known as moderate-intensity exercise. ? Try to do strengthening exercises at least twice each week. Do these in addition to the moderate-intensity exercise.  Know your numbers.Ask your health care provider to check your cholesterol and your blood glucose. Continue to have your blood tested as directed by your health care provider.  What should I know about cancer screening? There are several types of cancer. Take the following steps to reduce your risk and to catch any cancer development as early as possible. Breast Cancer  Practice breast self-awareness. ? This means understanding how your breasts normally appear and feel. ? It also means doing regular breast self-exams. Let your health care provider know about any changes, no matter how small.  If you are 40  or older, have a clinician do a breast exam (clinical breast exam or CBE) every year. Depending on your age, family history, and medical history, it may be recommended that you also have a yearly breast X-ray (mammogram).  If you  have a family history of breast cancer, talk with your health care provider about genetic screening.  If you are at high risk for breast cancer, talk with your health care provider about having an MRI and a mammogram every year.  Breast cancer (BRCA) gene test is recommended for women who have family members with BRCA-related cancers. Results of the assessment will determine the need for genetic counseling and BRCA1 and for BRCA2 testing. BRCA-related cancers include these types: ? Breast. This occurs in males or females. ? Ovarian. ? Tubal. This may also be called fallopian tube cancer. ? Cancer of the abdominal or pelvic lining (peritoneal cancer). ? Prostate. ? Pancreatic.  Cervical, Uterine, and Ovarian Cancer Your health care provider may recommend that you be screened regularly for cancer of the pelvic organs. These include your ovaries, uterus, and vagina. This screening involves a pelvic exam, which includes checking for microscopic changes to the surface of your cervix (Pap test).  For women ages 21-65, health care providers may recommend a pelvic exam and a Pap test every three years. For women ages 79-65, they may recommend the Pap test and pelvic exam, combined with testing for human papilloma virus (HPV), every five years. Some types of HPV increase your risk of cervical cancer. Testing for HPV may also be done on women of any age who have unclear Pap test results.  Other health care providers may not recommend any screening for nonpregnant women who are considered low risk for pelvic cancer and have no symptoms. Ask your health care provider if a screening pelvic exam is right for you.  If you have had past treatment for cervical cancer or a condition that could lead to cancer, you need Pap tests and screening for cancer for at least 20 years after your treatment. If Pap tests have been discontinued for you, your risk factors (such as having a new sexual partner) need to be  reassessed to determine if you should start having screenings again. Some women have medical problems that increase the chance of getting cervical cancer. In these cases, your health care provider may recommend that you have screening and Pap tests more often.  If you have a family history of uterine cancer or ovarian cancer, talk with your health care provider about genetic screening.  If you have vaginal bleeding after reaching menopause, tell your health care provider.  There are currently no reliable tests available to screen for ovarian cancer.  Lung Cancer Lung cancer screening is recommended for adults 69-62 years old who are at high risk for lung cancer because of a history of smoking. A yearly low-dose CT scan of the lungs is recommended if you:  Currently smoke.  Have a history of at least 30 pack-years of smoking and you currently smoke or have quit within the past 15 years. A pack-year is smoking an average of one pack of cigarettes per day for one year.  Yearly screening should:  Continue until it has been 15 years since you quit.  Stop if you develop a health problem that would prevent you from having lung cancer treatment.  Colorectal Cancer  This type of cancer can be detected and can often be prevented.  Routine colorectal cancer screening usually begins at  age 42 and continues through age 45.  If you have risk factors for colon cancer, your health care provider may recommend that you be screened at an earlier age.  If you have a family history of colorectal cancer, talk with your health care provider about genetic screening.  Your health care provider may also recommend using home test kits to check for hidden blood in your stool.  A small camera at the end of a tube can be used to examine your colon directly (sigmoidoscopy or colonoscopy). This is done to check for the earliest forms of colorectal cancer.  Direct examination of the colon should be repeated every  5-10 years until age 71. However, if early forms of precancerous polyps or small growths are found or if you have a family history or genetic risk for colorectal cancer, you may need to be screened more often.  Skin Cancer  Check your skin from head to toe regularly.  Monitor any moles. Be sure to tell your health care provider: ? About any new moles or changes in moles, especially if there is a change in a mole's shape or color. ? If you have a mole that is larger than the size of a pencil eraser.  If any of your family members has a history of skin cancer, especially at a young age, talk with your health care provider about genetic screening.  Always use sunscreen. Apply sunscreen liberally and repeatedly throughout the day.  Whenever you are outside, protect yourself by wearing long sleeves, pants, a wide-brimmed hat, and sunglasses.  What should I know about osteoporosis? Osteoporosis is a condition in which bone destruction happens more quickly than new bone creation. After menopause, you may be at an increased risk for osteoporosis. To help prevent osteoporosis or the bone fractures that can happen because of osteoporosis, the following is recommended:  If you are 46-71 years old, get at least 1,000 mg of calcium and at least 600 mg of vitamin D per day.  If you are older than age 55 but younger than age 65, get at least 1,200 mg of calcium and at least 600 mg of vitamin D per day.  If you are older than age 54, get at least 1,200 mg of calcium and at least 800 mg of vitamin D per day.  Smoking and excessive alcohol intake increase the risk of osteoporosis. Eat foods that are rich in calcium and vitamin D, and do weight-bearing exercises several times each week as directed by your health care provider. What should I know about how menopause affects my mental health? Depression may occur at any age, but it is more common as you become older. Common symptoms of depression  include:  Low or sad mood.  Changes in sleep patterns.  Changes in appetite or eating patterns.  Feeling an overall lack of motivation or enjoyment of activities that you previously enjoyed.  Frequent crying spells.  Talk with your health care provider if you think that you are experiencing depression. What should I know about immunizations? It is important that you get and maintain your immunizations. These include:  Tetanus, diphtheria, and pertussis (Tdap) booster vaccine.  Influenza every year before the flu season begins.  Pneumonia vaccine.  Shingles vaccine.  Your health care provider may also recommend other immunizations. This information is not intended to replace advice given to you by your health care provider. Make sure you discuss any questions you have with your health care provider. Document Released: 10/22/2005  Document Revised: 03/19/2016 Document Reviewed: 06/03/2015 Elsevier Interactive Patient Education  2018 Elsevier Inc.  

## 2017-06-15 NOTE — Progress Notes (Signed)
Patient: Molly Potter, Female    DOB: 1956-04-03, 61 y.o.   MRN: [id number] Visit Date: 06/15/2017  Today's Provider: Mar Daring, PA-C   Chief Complaint  Patient presents with  . Annual Exam   Subjective:    Annual physical exam Molly Potter is a 61 y.o. female who presents today for health maintenance and complete physical. She feels well. She reports exercising once a week for an hour water areobic. She reports she is sleeping poorly. 11/02/13 CPE 11/08/11 Pap-neg; HPV-neg -----------------------------------------------------------------   Review of Systems  Constitutional: Negative.   HENT: Positive for congestion and sinus pressure.   Eyes: Positive for itching.  Respiratory: Negative.   Cardiovascular: Negative.   Gastrointestinal: Negative.   Endocrine: Negative.   Genitourinary: Negative.   Musculoskeletal: Positive for arthralgias, back pain, joint swelling, myalgias and neck pain.  Skin: Negative.   Allergic/Immunologic: Negative.   Neurological: Negative.   Hematological: Negative.   Psychiatric/Behavioral: Negative.     Social History      She  reports that she has never smoked. She has never used smokeless tobacco. She reports that she drinks alcohol. She reports that she does not use drugs.       Social History   Social History  . Marital status: Married    Spouse name: N/A  . Number of children: 4  . Years of education: GED   Occupational History  .  [employer]   Social History Main Topics  . Smoking status: Never Smoker  . Smokeless tobacco: Never Used  . Alcohol use Yes     Comment: Occasional  . Drug use: No  . Sexual activity: Not Asked   Other Topics Concern  . None   Social History Narrative  . None    Past Medical History:  Diagnosis Date  . Arthritis   . Fatigue   . History of eczema   . Hyperthyroidism   . Joint pain      Patient Active Problem List   Diagnosis Date Noted  . Primary  osteoarthritis of both knees 02/16/2017  . Impingement syndrome of shoulder 07/02/2015  . Complete rotator cuff rupture of left shoulder 07/02/2015  . Cervical radiculitis 07/02/2015  . Impingement syndrome of both shoulders 07/02/2015  . Joint pain 04/10/2015  . Arthritis sicca 04/09/2015  . Accumulation of fluid in tissues 04/09/2015  . Essential hypertension 04/09/2015  . H/O eczema 04/09/2015  . Hyperthyroidism 04/09/2015  . Arthritis 04/09/2015  . Fatigue 04/09/2015    Past Surgical History:  Procedure Laterality Date  . ROTATOR CUFF REPAIR Right   . TUBAL LIGATION      Family History        Family Status  Relation Status  . Mother Deceased  . Father Deceased  . Sister Alive  . Brother Alive  . MGM Deceased  . PGF Deceased  . Sister Alive  . Sister Alive  . Sister Alive  . Brother Alive  . Brother Alive        Her family history includes CAD in her mother; Diabetes in her maternal grandmother and paternal grandfather; Hypertension in her father; Lung cancer in her father; Throat cancer in her mother.     Allergies  Allergen Reactions  . Effexor [Venlafaxine] Anaphylaxis  . Diazepam     Other reaction(s): Unknown  . Synvisc [Hylan G-F 20] Other (See Comments)    Myalgias, hand pain, back pain; Benadryl helped pain  .  Tramadol Nausea Only     Current Outpatient Prescriptions:  .  acetaminophen (TYLENOL) 500 MG tablet, Take 1,000 mg by mouth every 6 (six) hours as needed., Disp: , Rfl:  .  cyclobenzaprine (FLEXERIL) 5 MG tablet, Take by mouth., Disp: , Rfl:  .  meloxicam (MOBIC) 15 MG tablet, , Disp: , Rfl: 1   Patient Care Team: Mar Daring, PA-C as PCP - General (Family Medicine)      Objective:   Vitals: BP 130/82 (BP Location: Left Arm, Patient Position: Sitting, Cuff Size: Large)   Pulse 84   Temp 98 F (36.7 C) (Oral)   Resp 16   Ht 5\' 5"  (1.651 m)   Wt 200 lb 12.8 oz (91.1 kg)   SpO2 96%   BMI 33.41 kg/m    Vitals:   06/15/17  1421  BP: 130/82  Pulse: 84  Resp: 16  Temp: 98 F (36.7 C)  TempSrc: Oral  SpO2: 96%  Weight: 200 lb 12.8 oz (91.1 kg)  Height: 5\' 5"  (1.651 m)     Physical Exam  Constitutional: She is oriented to person, place, and time. She appears well-developed and well-nourished. No distress.  HENT:  Head: Normocephalic and atraumatic.  Right Ear: Hearing, tympanic membrane, external ear and ear canal normal.  Left Ear: Hearing, tympanic membrane, external ear and ear canal normal.  Nose: Nose normal.  Mouth/Throat: Uvula is midline, oropharynx is clear and moist and mucous membranes are normal. No oropharyngeal exudate.  Eyes: Pupils are equal, round, and reactive to light. Conjunctivae and EOM are normal. Right eye exhibits no discharge. Left eye exhibits no discharge. No scleral icterus.  Neck: Normal range of motion. Neck supple. No JVD present. Carotid bruit is not present. No tracheal deviation present. No thyromegaly present.  Cardiovascular: Normal rate, regular rhythm, normal heart sounds and intact distal pulses.  Exam reveals no gallop and no friction rub.   No murmur heard. Pulmonary/Chest: Effort normal and breath sounds normal. No respiratory distress. She has no wheezes. She has no rales. She exhibits no tenderness. Right breast exhibits no inverted nipple, no mass, no nipple discharge, no skin change and no tenderness. Left breast exhibits no inverted nipple, no mass, no nipple discharge, no skin change and no tenderness. Breasts are symmetrical.  Abdominal: Soft. Bowel sounds are normal. She exhibits no distension and no mass. There is no tenderness. There is no rebound and no guarding. Hernia confirmed negative in the right inguinal area and confirmed negative in the left inguinal area.  Genitourinary: Rectum normal, vagina normal and uterus normal. No breast swelling, tenderness, discharge or bleeding. Pelvic exam was performed with patient supine. There is no rash, tenderness,  lesion or injury on the right labia. There is no rash, tenderness, lesion or injury on the left labia. Cervix exhibits no motion tenderness, no discharge and no friability. Right adnexum displays no mass, no tenderness and no fullness. Left adnexum displays no mass, no tenderness and no fullness. No erythema, tenderness or bleeding in the vagina. No signs of injury around the vagina. No vaginal discharge found.  Musculoskeletal: Normal range of motion. She exhibits no edema or tenderness.  Lymphadenopathy:    She has no cervical adenopathy.       Right: No inguinal adenopathy present.       Left: No inguinal adenopathy present.  Neurological: She is alert and oriented to person, place, and time. She has normal reflexes. No cranial nerve deficit. Coordination normal.  Skin: Skin  is warm and dry. No rash noted. She is not diaphoretic.  Psychiatric: She has a normal mood and affect. Her behavior is normal. Judgment and thought content normal.  Vitals reviewed.    Depression Screen PHQ 2/9 Scores 06/15/2017 02/16/2017  PHQ - 2 Score 0 0  PHQ- 9 Score - 3      Assessment & Plan:     Routine Health Maintenance and Physical Exam  Exercise Activities and Dietary recommendations Goals    None      Immunization History  Administered Date(s) Administered  . Influenza,inj,Quad PF,6+ Mos 07/03/2014  . Zoster 11/08/2011    Health Maintenance  Topic Date Due  . Hepatitis C Screening  27-Aug-1956  . HIV Screening  07/05/1971  . TETANUS/TDAP  07/05/1975  . MAMMOGRAM  07/04/2006  . COLONOSCOPY  07/04/2006  . PAP SMEAR  11/07/2014  . INFLUENZA VACCINE  04/13/2017     Discussed health benefits of physical activity, and encouraged her to engage in regular exercise appropriate for her age and condition.    1. Annual physical exam Normal physical exam today. Will check labs as below and f/u pending lab results. If labs are stable and WNL she will not need to have these rechecked for one  year at her next annual physical exam. She is to call the office in the meantime if she has any acute issue, questions or concerns. - CBC with Differential/Platelet - Comprehensive metabolic panel - Lipid panel  2. Cervical cancer screening Pap collected today. Will send as below and f/u pending results. - Pap IG and HPV (high risk) DNA detection  3. Screening for breast cancer Breast exam today was normal. There is no family history of breast cancer. She does perform regular self breast exams. Mammogram was ordered as below. Information for [facility] was given to patient so she may schedule her mammogram at her convenience. - MM DIGITAL SCREENING BILATERAL; Future  4. Colon cancer screening OC lite collected today and was positive. Patient states she has history of hemorrhoids and feels it is from that.  - IFOBT POC (occult bld, rslt in office); Future  5. Essential hypertension Stable. Will check labs as below and f/u pending results. - CBC with Differential/Platelet - Comprehensive metabolic panel - Lipid panel  6. Hyperthyroidism Asymptomatic. Will check labs as below and f/u pending results. - TSH  7. Need for hepatitis C screening test - Hepatitis C Antibody  8. BMI 33.0-33.9,adult Counseled patient on healthy lifestyle modifications including dieting and exercise. Has lost 9 pounds since last visit with dieting and water aerobics.   9. Influenza vaccination declined  --------------------------------------------------------------------    Mar Daring, PA-C  [facility]

## 2017-06-16 LAB — CBC WITH DIFFERENTIAL/PLATELET
Basophils Absolute: 61 cells/uL (ref 0–200)
Basophils Relative: 0.7 %
EOS PCT: 2.2 %
Eosinophils Absolute: 191 cells/uL (ref 15–500)
HEMATOCRIT: 38.8 % (ref 35.0–45.0)
HEMOGLOBIN: 13 g/dL (ref 11.7–15.5)
LYMPHS ABS: 2923 {cells}/uL (ref 850–3900)
MCH: 30.4 pg (ref 27.0–33.0)
MCHC: 33.5 g/dL (ref 32.0–36.0)
MCV: 90.7 fL (ref 80.0–100.0)
MONOS PCT: 8.8 %
MPV: 9.9 fL (ref 7.5–12.5)
NEUTROS ABS: 4759 {cells}/uL (ref 1500–7800)
NEUTROS PCT: 54.7 %
Platelets: 421 10*3/uL — ABNORMAL HIGH (ref 140–400)
RBC: 4.28 10*6/uL (ref 3.80–5.10)
RDW: 12.2 % (ref 11.0–15.0)
Total Lymphocyte: 33.6 %
WBC mixed population: 766 cells/uL (ref 200–950)
WBC: 8.7 10*3/uL (ref 3.8–10.8)

## 2017-06-16 LAB — COMPREHENSIVE METABOLIC PANEL
AG Ratio: 1.7 (calc) (ref 1.0–2.5)
ALBUMIN MSPROF: 4.5 g/dL (ref 3.6–5.1)
ALKALINE PHOSPHATASE (APISO): 79 U/L (ref 33–130)
ALT: 29 U/L (ref 6–29)
AST: 27 U/L (ref 10–35)
BILIRUBIN TOTAL: 0.4 mg/dL (ref 0.2–1.2)
BUN: 16 mg/dL (ref 7–25)
CHLORIDE: 105 mmol/L (ref 98–110)
CO2: 28 mmol/L (ref 20–32)
CREATININE: 0.72 mg/dL (ref 0.50–0.99)
Calcium: 9.5 mg/dL (ref 8.6–10.4)
GLOBULIN: 2.7 g/dL (ref 1.9–3.7)
Glucose, Bld: 104 mg/dL (ref 65–139)
POTASSIUM: 4.2 mmol/L (ref 3.5–5.3)
SODIUM: 141 mmol/L (ref 135–146)
Total Protein: 7.2 g/dL (ref 6.1–8.1)

## 2017-06-16 LAB — HEPATITIS C ANTIBODY
HEP C AB: NONREACTIVE
SIGNAL TO CUT-OFF: 0.04 (ref <1.00)

## 2017-06-16 LAB — LIPID PANEL
CHOL/HDL RATIO: 3.9 (calc) (ref <5.0)
CHOLESTEROL: 179 mg/dL (ref <200)
HDL: 46 mg/dL — ABNORMAL LOW (ref >50)
LDL CHOLESTEROL (CALC): 108 mg/dL — AB
Non-HDL Cholesterol (Calc): 133 mg/dL (calc) — ABNORMAL HIGH (ref <130)
Triglycerides: 130 mg/dL (ref <150)

## 2017-06-16 LAB — TSH: TSH: 3.72 m[IU]/L (ref 0.40–4.50)

## 2017-06-17 ENCOUNTER — Telehealth: Payer: Self-pay

## 2017-06-17 LAB — PAP IG AND HPV HIGH-RISK: HPV DNA HIGH RISK: NOT DETECTED

## 2017-06-17 NOTE — Telephone Encounter (Signed)
-----   Message from Mar Daring, [place] sent at 06/16/2017  9:24 AM EDT ----- All labs are within normal limits and stable.  Thanks! -JB

## 2017-06-17 NOTE — Telephone Encounter (Signed)
LMTCB  Thanks,  -Payten Beaumier 

## 2017-06-20 ENCOUNTER — Telehealth: Payer: Self-pay

## 2017-06-20 NOTE — Telephone Encounter (Signed)
-----   Message from Mar Daring, [place] sent at 06/20/2017 11:15 AM EDT ----- Pap was unsatisfactory but HPV negative. Can repeat now at patient convenience at no charge or can wait and just repeat next year at next annual physical.

## 2017-06-20 NOTE — Telephone Encounter (Signed)
Patient advised as below. Patient states she will wait till next year for repeat pap. sd

## 2017-06-20 NOTE — Telephone Encounter (Signed)
Pt returning call, unable to reach CMA, pt request to call her back on her cell phone and not house number.

## 2017-06-21 NOTE — Telephone Encounter (Signed)
Patient advised as below.  

## 2017-10-07 ENCOUNTER — Ambulatory Visit: Payer: BLUE CROSS/BLUE SHIELD | Admitting: Physician Assistant

## 2017-10-07 ENCOUNTER — Encounter: Payer: Self-pay | Admitting: Physician Assistant

## 2017-10-07 VITALS — BP 130/82 | HR 72 | Temp 98.5°F | Resp 16 | Wt 203.0 lb

## 2017-10-07 DIAGNOSIS — J0100 Acute maxillary sinusitis, unspecified: Secondary | ICD-10-CM | POA: Diagnosis not present

## 2017-10-07 MED ORDER — AZITHROMYCIN 250 MG PO TABS: ORAL_TABLET | ORAL | 0 refills | Status: DC

## 2017-10-07 NOTE — Patient Instructions (Signed)

## 2017-10-07 NOTE — Progress Notes (Signed)
Patient: Molly Potter Female    DOB: [birth year]   62 y.o.   MRN: [id number] Visit Date: 10/07/2017  Today's Provider: Mar Daring, PA-C   Chief Complaint  Patient presents with  . Dizziness   Subjective:    HPI Patient comes in today c/o dizziness off and on. Patient feels that it may be her sinuses. She reports that she has had symptoms for 3 days. She denies any fevers. She does have PND and a mild cough. She has been taking Alker seltzer plus for symptoms with minimal relief.     Allergies  Allergen Reactions  . Effexor [Venlafaxine] Anaphylaxis  . Diazepam     Other reaction(s): Unknown  . Synvisc [Hylan G-F 20] Other (See Comments)    Myalgias, hand pain, back pain; Benadryl helped pain  . Tramadol Nausea Only     Current Outpatient Medications:  .  acetaminophen (TYLENOL) 500 MG tablet, Take 1,000 mg by mouth every 6 (six) hours as needed., Disp: , Rfl:  .  cyclobenzaprine (FLEXERIL) 5 MG tablet, Take by mouth., Disp: , Rfl:  .  meloxicam (MOBIC) 15 MG tablet, , Disp: , Rfl: 1  Review of Systems  Constitutional: Positive for activity change and fatigue.  HENT: Positive for congestion, postnasal drip, rhinorrhea, sneezing and voice change. Negative for ear pain.   Respiratory: Positive for cough. Negative for chest tightness, shortness of breath and wheezing.   Neurological: Positive for dizziness and headaches.    Social History   Tobacco Use  . Smoking status: Never Smoker  . Smokeless tobacco: Never Used  Substance Use Topics  . Alcohol use: Yes    Comment: Occasional   Objective:   BP 130/82 (BP Location: Right Arm, Patient Position: Sitting, Cuff Size: Large)   Pulse 72   Temp 98.5 F (36.9 C)   Resp 16   Wt 203 lb (92.1 kg)   BMI 33.78 kg/m  Vitals:   10/07/17 1607  BP: 130/82  Pulse: 72  Resp: 16  Temp: 98.5 F (36.9 C)  Weight: 203 lb (92.1 kg)     Physical Exam  Constitutional: She appears well-developed and  well-nourished. No distress.  HENT:  Head: Normocephalic and atraumatic.  Right Ear: Hearing, external ear and ear canal normal. A middle ear effusion is present.  Left Ear: Hearing, external ear and ear canal normal. A middle ear effusion is present.  Nose: Right sinus exhibits maxillary sinus tenderness and frontal sinus tenderness. Left sinus exhibits maxillary sinus tenderness and frontal sinus tenderness.  Mouth/Throat: Uvula is midline, oropharynx is clear and moist and mucous membranes are normal. No oropharyngeal exudate.  Neck: Normal range of motion. Neck supple. No tracheal deviation present. No thyromegaly present.  Cardiovascular: Normal rate, regular rhythm and normal heart sounds. Exam reveals no gallop and no friction rub.  No murmur heard. Pulmonary/Chest: Effort normal and breath sounds normal. No stridor. No respiratory distress. She has no wheezes. She has no rales.  Lymphadenopathy:    She has no cervical adenopathy.  Skin: She is not diaphoretic.  Vitals reviewed.      Assessment & Plan:     1. Acute maxillary sinusitis, recurrence not specified Worsening symptoms that have not responded to OTC medications. Will give azithromycin as below. Continue allergy medications. Stay well hydrated and get plenty of rest. Call if no symptom improvement or if symptoms worsen. - azithromycin (ZITHROMAX) 250 MG tablet; Take 2 tablets PO on day one,  and one tablet PO daily thereafter until completed.  Dispense: 6 tablet; Refill: 0       Mar Daring, PA-C  [facility]

## 2018-05-01 ENCOUNTER — Ambulatory Visit: Payer: BLUE CROSS/BLUE SHIELD | Admitting: Physician Assistant

## 2018-05-01 ENCOUNTER — Encounter: Payer: Self-pay | Admitting: Physician Assistant

## 2018-05-01 VITALS — BP 170/100 | HR 81 | Temp 98.4°F | Resp 16 | Wt 203.8 lb

## 2018-05-01 DIAGNOSIS — J301 Allergic rhinitis due to pollen: Secondary | ICD-10-CM | POA: Diagnosis not present

## 2018-05-01 DIAGNOSIS — I10 Essential (primary) hypertension: Secondary | ICD-10-CM | POA: Diagnosis not present

## 2018-05-01 MED ORDER — DILTIAZEM HCL ER 120 MG PO CP24: 120.0000 mg | ORAL_CAPSULE | Freq: Every day | ORAL | 0 refills | Status: DC

## 2018-05-01 NOTE — Patient Instructions (Signed)
Diltiazem extended-release capsules or tablets What is this medicine? DILTIAZEM (dil TYE a zem) is a calcium-channel blocker. It affects the amount of calcium found in your heart and muscle cells. This relaxes your blood vessels, which can reduce the amount of work the heart has to do. This medicine is used to treat high blood pressure and chest pain caused by angina. This medicine may be used for other purposes; ask your health care provider or pharmacist if you have questions. COMMON BRAND NAME(S): Cardizem CD, Cardizem LA, Cardizem SR, Cartia XT, Dilacor XR, Dilt-CD, Diltia XT, Diltzac, Matzim LA, Rema Fendt, Tiamate, Tiazac What should I tell my health care provider before I take this medicine? They need to know if you have any of these conditions: -heart problems, low blood pressure, irregular heartbeat -liver disease -previous heart attack -an unusual or allergic reaction to diltiazem, other medicines, foods, dyes, or preservatives -pregnant or trying to get pregnant -breast-feeding How should I use this medicine? Take this medicine by mouth with a glass of water. Follow the directions on the prescription label. Swallow whole, do not crush or chew. Ask your doctor or pharmacist if your should take this medicine with food. Take your doses at regular intervals. Do not take your medicine more often then directed. Do not stop taking except on the advice of your doctor or health care professional. Ask your doctor or health care professional how to gradually reduce the dose. Talk to your pediatrician regarding the use of this medicine in children. Special care may be needed. Overdosage: If you think you have taken too much of this medicine contact a poison control center or emergency room at once. NOTE: This medicine is only for you. Do not share this medicine with others. What if I miss a dose? If you miss a dose, take it as soon as you can. If it is almost time for your next dose, take only that  dose. Do not take double or extra doses. What may interact with this medicine? Do not take this medicine with any of the following medications: -cisapride -hawthorn -pimozide -ranolazine -red yeast rice This medicine may also interact with the following medications: -buspirone -carbamazepine -cimetidine -cyclosporine -digoxin -local anesthetics or general anesthetics -lovastatin -medicines for anxiety or difficulty sleeping like midazolam and triazolam -medicines for high blood pressure or heart problems -quinidine -rifampin, rifabutin, or rifapentine This list may not describe all possible interactions. Give your health care provider a list of all the medicines, herbs, non-prescription drugs, or dietary supplements you use. Also tell them if you smoke, drink alcohol, or use illegal drugs. Some items may interact with your medicine. What should I watch for while using this medicine? Check your blood pressure and pulse rate regularly. Ask your doctor or health care professional what your blood pressure and pulse rate should be and when you should contact him or her. You may feel dizzy or lightheaded. Do not drive, use machinery, or do anything that needs mental alertness until you know how this medicine affects you. To reduce the risk of dizzy or fainting spells, do not sit or stand up quickly, especially if you are an older patient. Alcohol can make you more dizzy or increase flushing and rapid heartbeats. Avoid alcoholic drinks. What side effects may I notice from receiving this medicine? Side effects that you should report to your doctor or health care professional as soon as possible: -allergic reactions like skin rash, itching or hives, swelling of the face, lips, or tongue -confusion,  mental depression -feeling faint or lightheaded, falls -redness, blistering, peeling or loosening of the skin, including inside the mouth -slow, irregular heartbeat -swelling of the feet and  ankles -unusual bleeding or bruising, pinpoint red spots on the skin Side effects that usually do not require medical attention (report to your doctor or health care professional if they continue or are bothersome): -constipation or diarrhea -difficulty sleeping -facial flushing -headache -nausea, vomiting -sexual dysfunction -weak or tired This list may not describe all possible side effects. Call your doctor for medical advice about side effects. You may report side effects to FDA at 1-800-FDA-1088. Where should I keep my medicine? Keep out of the reach of children. Store at room temperature between 15 and 30 degrees C (59 and 86 degrees F). Protect from humidity. Throw away any unused medicine after the expiration date. NOTE: This sheet is a summary. It may not cover all possible information. If you have questions about this medicine, talk to your doctor, pharmacist, or health care provider.  2018 Elsevier/Gold Standard (2007-12-21 14:35:47)

## 2018-05-01 NOTE — Progress Notes (Signed)
Patient: Molly Potter Female    DOB: [birth year]   62 y.o.   MRN: [id number] Visit Date: 05/01/2018  Today's Provider: Mar Daring, PA-C   Chief Complaint  Patient presents with  . Sinusitis   Subjective:    Sinusitis  This is a new problem. The current episode started in the past 7 days (Started Saturday). The problem is unchanged. There has been no fever. Associated symptoms include headaches and sinus pressure. Pertinent negatives include no shortness of breath. ("Post nasal drip") Treatments tried: Vinegar and honey. [place] and honey. OTC eye drops for allergies. The treatment provided no relief.   Patient also c/o elevated blood pressure. Patient with Essential Hypertension seen 10 months ago and patient was stable. Patient currently taking no blood pressure medication. Patient reports blood pressure readings at home are 144/84 and this morning was 119/84. She is not exercising.She is not adherent low salt diet. Current diet in general, an "unhealthy"diet.  BP Readings from Last 3 Encounters:  05/01/18 (!) 170/100  10/07/17 130/82  06/15/17 130/82      Allergies  Allergen Reactions  . Effexor [Venlafaxine] Anaphylaxis  . Diazepam     Other reaction(s): Unknown  . Synvisc [Hylan G-F 20] Other (See Comments)    Myalgias, hand pain, back pain; Benadryl helped pain  . Tramadol Nausea Only     Current Outpatient Medications:  .  acetaminophen (TYLENOL) 500 MG tablet, Take 1,000 mg by mouth every 6 (six) hours as needed., Disp: , Rfl:  .  cyclobenzaprine (FLEXERIL) 5 MG tablet, Take by mouth., Disp: , Rfl:  .  meloxicam (MOBIC) 15 MG tablet, , Disp: , Rfl: 1 .  azithromycin (ZITHROMAX) 250 MG tablet, Take 2 tablets PO on day one, and one tablet PO daily thereafter until completed. (Patient not taking: Reported on 05/01/2018), Disp: 6 tablet, Rfl: 0  Review of Systems  Constitutional: Negative.   HENT: Positive for sinus pressure.   Respiratory: Negative for  shortness of breath.   Cardiovascular: Negative for chest pain, palpitations and leg swelling.  Neurological: Positive for dizziness and headaches.    Social History   Tobacco Use  . Smoking status: Never Smoker  . Smokeless tobacco: Never Used  Substance Use Topics  . Alcohol use: Yes    Comment: Occasional   Objective:   BP (!) 170/100 (BP Location: Left Arm, Patient Position: Sitting, Cuff Size: Large)   Pulse 81   Temp 98.4 F (36.9 C) (Oral)   Resp 16   Wt 203 lb 12.8 oz (92.4 kg)   SpO2 97%   BMI 33.91 kg/m  Vitals:   05/01/18 1540  BP: (!) 170/100  Pulse: 81  Resp: 16  Temp: 98.4 F (36.9 C)  TempSrc: Oral  SpO2: 97%  Weight: 203 lb 12.8 oz (92.4 kg)     Physical Exam  Constitutional: She appears well-developed and well-nourished. No distress.  HENT:  Head: Normocephalic and atraumatic.  Right Ear: Hearing, tympanic membrane, external ear and ear canal normal.  Left Ear: Hearing, tympanic membrane, external ear and ear canal normal.  Nose: Mucosal edema present. Right sinus exhibits maxillary sinus tenderness. Right sinus exhibits no frontal sinus tenderness. Left sinus exhibits maxillary sinus tenderness. Left sinus exhibits no frontal sinus tenderness.  Mouth/Throat: Uvula is midline, oropharynx is clear and moist and mucous membranes are normal. No oropharyngeal exudate, posterior oropharyngeal edema or posterior oropharyngeal erythema.  Neck: Normal range of motion. Neck supple. No JVD present. No  tracheal deviation present. No thyromegaly present.  Cardiovascular: Normal rate, regular rhythm and normal heart sounds. Exam reveals no gallop and no friction rub.  No murmur heard. Pulmonary/Chest: Effort normal and breath sounds normal. No stridor. No respiratory distress. She has no wheezes. She has no rales.  Lymphadenopathy:    She has no cervical adenopathy.  Skin: She is not diaphoretic.  Vitals reviewed.      Assessment & Plan:     1. Essential  hypertension Will try diltiazem 120mg  xr as below. I will see her back in 6-7 weeks for CPE and HTN. She is to call if BP remains elevated or if she does not tolerate the medication.  - diltiazem (DILACOR XR) 120 MG 24 hr capsule; Take 1 capsule (120 mg total) by mouth daily.  Dispense: 30 capsule; Refill: 0  2. Seasonal allergic rhinitis due to pollen Continue zyrtec. Add Mucinex and Flonase. Call if symptoms worsen.        Mar Daring, PA-C  [facility]

## 2018-05-02 ENCOUNTER — Telehealth: Payer: Self-pay | Admitting: Physician Assistant

## 2018-05-02 ENCOUNTER — Telehealth: Payer: Self-pay

## 2018-05-02 DIAGNOSIS — I10 Essential (primary) hypertension: Secondary | ICD-10-CM

## 2018-05-02 MED ORDER — DILTIAZEM HCL ER COATED BEADS 120 MG PO CP24: 120.0000 mg | ORAL_CAPSULE | Freq: Every day | ORAL | 0 refills | Status: DC

## 2018-05-02 MED ORDER — VERAPAMIL HCL ER 120 MG PO CP24: 120.0000 mg | ORAL_CAPSULE | Freq: Every day | ORAL | 0 refills | Status: DC

## 2018-05-02 NOTE — Telephone Encounter (Signed)
Left patient a message advising her that a new RX has been sent to [facility].

## 2018-05-02 NOTE — Telephone Encounter (Signed)
Gotcha. Will change back.

## 2018-05-02 NOTE — Telephone Encounter (Signed)
Pharmacy is out of Diltiazem 120 mg. Said they may have it today after 3 or they may not.  Do you want to switch her BP medication?

## 2018-05-02 NOTE — Telephone Encounter (Signed)
Change to verapamil

## 2018-05-02 NOTE — Telephone Encounter (Signed)
Patient states she did not request the medication be changed. She states the pharmacy should have the medication in today around 3 pm. I think this message was meant to be a FYI.

## 2018-05-14 DIAGNOSIS — R8290 Unspecified abnormal findings in urine: Secondary | ICD-10-CM | POA: Diagnosis not present

## 2018-05-22 ENCOUNTER — Ambulatory Visit: Payer: BLUE CROSS/BLUE SHIELD | Admitting: Family Medicine

## 2018-05-24 ENCOUNTER — Ambulatory Visit: Payer: BLUE CROSS/BLUE SHIELD | Admitting: Physician Assistant

## 2018-05-24 ENCOUNTER — Encounter: Payer: Self-pay | Admitting: Physician Assistant

## 2018-05-24 VITALS — BP 150/86 | HR 97 | Temp 98.3°F | Resp 16 | Wt 203.0 lb

## 2018-05-24 DIAGNOSIS — R350 Frequency of micturition: Secondary | ICD-10-CM

## 2018-05-24 DIAGNOSIS — I10 Essential (primary) hypertension: Secondary | ICD-10-CM | POA: Diagnosis not present

## 2018-05-24 DIAGNOSIS — Z23 Encounter for immunization: Secondary | ICD-10-CM | POA: Diagnosis not present

## 2018-05-24 DIAGNOSIS — Z6833 Body mass index (BMI) 33.0-33.9, adult: Secondary | ICD-10-CM

## 2018-05-24 LAB — POCT URINALYSIS DIPSTICK
BILIRUBIN UA: NEGATIVE
Glucose, UA: NEGATIVE
Ketones, UA: NEGATIVE
Leukocytes, UA: NEGATIVE
Nitrite, UA: NEGATIVE
PH UA: 6 (ref 5.0–8.0)
Protein, UA: NEGATIVE
RBC UA: NEGATIVE
SPEC GRAV UA: 1.025 (ref 1.010–1.025)
UROBILINOGEN UA: 0.2 U/dL

## 2018-05-24 MED ORDER — DILTIAZEM HCL ER 240 MG PO CP24: 240.0000 mg | ORAL_CAPSULE | Freq: Every day | ORAL | 1 refills | Status: DC

## 2018-05-24 MED ORDER — DILTIAZEM HCL ER BEADS 240 MG PO CP24: 240.0000 mg | ORAL_CAPSULE | Freq: Every day | ORAL | 1 refills | Status: DC

## 2018-05-24 NOTE — Patient Instructions (Signed)
DASH Eating Plan DASH stands for "Dietary Approaches to Stop Hypertension." The DASH eating plan is a healthy eating plan that has been shown to reduce high blood pressure (hypertension). It may also reduce your risk for type 2 diabetes, heart disease, and stroke. The DASH eating plan may also help with weight loss. What are tips for following this plan? General guidelines  Avoid eating more than 2,300 mg (milligrams) of salt (sodium) a day. If you have hypertension, you may need to reduce your sodium intake to 1,500 mg a day.  Limit alcohol intake to no more than 1 drink a day for nonpregnant women and 2 drinks a day for men. One drink equals 12 oz of beer, 5 oz of wine, or 1 oz of hard liquor.  Work with your health care provider to maintain a healthy body weight or to lose weight. Ask what an ideal weight is for you.  Get at least 30 minutes of exercise that causes your heart to beat faster (aerobic exercise) most days of the week. Activities may include walking, swimming, or biking.  Work with your health care provider or diet and nutrition specialist (dietitian) to adjust your eating plan to your individual calorie needs. Reading food labels  Check food labels for the amount of sodium per serving. Choose foods with less than 5 percent of the Daily Value of sodium. Generally, foods with less than 300 mg of sodium per serving fit into this eating plan.  To find whole grains, look for the word "whole" as the first word in the ingredient list. Shopping  Buy products labeled as "low-sodium" or "no salt added."  Buy fresh foods. Avoid canned foods and premade or frozen meals. Cooking  Avoid adding salt when cooking. Use salt-free seasonings or herbs instead of table salt or sea salt. Check with your health care provider or pharmacist before using salt substitutes.  Do not fry foods. Cook foods using healthy methods such as baking, boiling, grilling, and broiling instead.  Cook with  heart-healthy oils, such as olive, canola, soybean, or sunflower oil. Meal planning   Eat a balanced diet that includes: ? 5 or more servings of fruits and vegetables each day. At each meal, try to fill half of your plate with fruits and vegetables. ? Up to 6-8 servings of whole grains each day. ? Less than 6 oz of lean meat, poultry, or fish each day. A 3-oz serving of meat is about the same size as a deck of cards. One egg equals 1 oz. ? 2 servings of low-fat dairy each day. ? A serving of nuts, seeds, or beans 5 times each week. ? Heart-healthy fats. Healthy fats called Omega-3 fatty acids are found in foods such as flaxseeds and coldwater fish, like sardines, salmon, and mackerel.  Limit how much you eat of the following: ? Canned or prepackaged foods. ? Food that is high in trans fat, such as fried foods. ? Food that is high in saturated fat, such as fatty meat. ? Sweets, desserts, sugary drinks, and other foods with added sugar. ? Full-fat dairy products.  Do not salt foods before eating.  Try to eat at least 2 vegetarian meals each week.  Eat more home-cooked food and less restaurant, buffet, and fast food.  When eating at a restaurant, ask that your food be prepared with less salt or no salt, if possible. What foods are recommended? The items listed may not be a complete list. Talk with your dietitian about what   dietary choices are best for you. Grains Whole-grain or whole-wheat bread. Whole-grain or whole-wheat pasta. Brown rice. Oatmeal. Quinoa. Bulgur. Whole-grain and low-sodium cereals. Pita bread. Low-fat, low-sodium crackers. Whole-wheat flour tortillas. Vegetables Fresh or frozen vegetables (raw, steamed, roasted, or grilled). Low-sodium or reduced-sodium tomato and vegetable juice. Low-sodium or reduced-sodium tomato sauce and tomato paste. Low-sodium or reduced-sodium canned vegetables. Fruits All fresh, dried, or frozen fruit. Canned fruit in natural juice (without  added sugar). Meat and other protein foods Skinless chicken or turkey. Ground chicken or turkey. Pork with fat trimmed off. Fish and seafood. Egg whites. Dried beans, peas, or lentils. Unsalted nuts, nut butters, and seeds. Unsalted canned beans. Lean cuts of beef with fat trimmed off. Low-sodium, lean deli meat. Dairy Low-fat (1%) or fat-free (skim) milk. Fat-free, low-fat, or reduced-fat cheeses. Nonfat, low-sodium ricotta or cottage cheese. Low-fat or nonfat yogurt. Low-fat, low-sodium cheese. Fats and oils Soft margarine without trans fats. Vegetable oil. Low-fat, reduced-fat, or light mayonnaise and salad dressings (reduced-sodium). Canola, safflower, olive, soybean, and sunflower oils. Avocado. Seasoning and other foods Herbs. Spices. Seasoning mixes without salt. Unsalted popcorn and pretzels. Fat-free sweets. What foods are not recommended? The items listed may not be a complete list. Talk with your dietitian about what dietary choices are best for you. Grains Baked goods made with fat, such as croissants, muffins, or some breads. Dry pasta or rice meal packs. Vegetables Creamed or fried vegetables. Vegetables in a cheese sauce. Regular canned vegetables (not low-sodium or reduced-sodium). Regular canned tomato sauce and paste (not low-sodium or reduced-sodium). Regular tomato and vegetable juice (not low-sodium or reduced-sodium). Pickles. Olives. Fruits Canned fruit in a light or heavy syrup. Fried fruit. Fruit in cream or butter sauce. Meat and other protein foods Fatty cuts of meat. Ribs. Fried meat. Bacon. Sausage. Bologna and other processed lunch meats. Salami. Fatback. Hotdogs. Bratwurst. Salted nuts and seeds. Canned beans with added salt. Canned or smoked fish. Whole eggs or egg yolks. Chicken or turkey with skin. Dairy Whole or 2% milk, cream, and half-and-half. Whole or full-fat cream cheese. Whole-fat or sweetened yogurt. Full-fat cheese. Nondairy creamers. Whipped toppings.  Processed cheese and cheese spreads. Fats and oils Butter. Stick margarine. Lard. Shortening. Ghee. Bacon fat. Tropical oils, such as coconut, palm kernel, or palm oil. Seasoning and other foods Salted popcorn and pretzels. Onion salt, garlic salt, seasoned salt, table salt, and sea salt. Worcestershire sauce. Tartar sauce. Barbecue sauce. Teriyaki sauce. Soy sauce, including reduced-sodium. Steak sauce. Canned and packaged gravies. Fish sauce. Oyster sauce. Cocktail sauce. Horseradish that you find on the shelf. Ketchup. Mustard. Meat flavorings and tenderizers. Bouillon cubes. Hot sauce and Tabasco sauce. Premade or packaged marinades. Premade or packaged taco seasonings. Relishes. Regular salad dressings. Where to find more information:  National Heart, Lung, and Blood Institute: www.nhlbi.nih.gov  American Heart Association: www.heart.org Summary  The DASH eating plan is a healthy eating plan that has been shown to reduce high blood pressure (hypertension). It may also reduce your risk for type 2 diabetes, heart disease, and stroke.  With the DASH eating plan, you should limit salt (sodium) intake to 2,300 mg a day. If you have hypertension, you may need to reduce your sodium intake to 1,500 mg a day.  When on the DASH eating plan, aim to eat more fresh fruits and vegetables, whole grains, lean proteins, low-fat dairy, and heart-healthy fats.  Work with your health care provider or diet and nutrition specialist (dietitian) to adjust your eating plan to your individual   calorie needs. This information is not intended to replace advice given to you by your health care provider. Make sure you discuss any questions you have with your health care provider. Document Released: 08/19/2011 Document Revised: 08/23/2016 Document Reviewed: 08/23/2016 Elsevier Interactive Patient Education  2018 [employer]vier [employer].  

## 2018-05-24 NOTE — Progress Notes (Signed)
Patient: Molly Potter Female    DOB: [birth year]   62 y.o.   MRN: [id number] Visit Date: 05/24/2018  Today's Provider: Mar Daring, PA-C   Chief Complaint  Patient presents with  . Follow-up  . Hypertension   Subjective:    HPI  Follow up for UTI  The patient was last seen for this 1 weeks ago. Changes made at last visit include started Cipro. Was seen at [facility] for UTI over one week ago.   She reports excellent compliance with treatment. She feels that condition is Improved. She is not having side effects.  Patient denies painful urination or burning. ------------------------------------------------------------------------------------   Hypertension, follow-up:  BP Readings from Last 3 Encounters:  05/24/18 (!) 150/86  05/01/18 (!) 170/100  10/07/17 130/82    She was last seen for hypertension 1 months ago.  BP at that visit was 170/100. Management changes since that visit include no changes. She reports excellent compliance with treatment. She is not having side effects.  She is exercising. She is adherent to low salt diet.   Outside blood pressures are elevated 197/97 last night. She is experiencing lower extremity edema.  Patient denies chest pain.   Cardiovascular risk factors include hypertension and obesity (BMI >= 30 kg/m2).  Use of agents associated with hypertension: NSAIDs.     Weight trend: stable Wt Readings from Last 3 Encounters:  05/24/18 203 lb (92.1 kg)  05/01/18 203 lb 12.8 oz (92.4 kg)  10/07/17 203 lb (92.1 kg)    Current diet: in general, a "healthy" diet    ------------------------------------------------------------------------    Allergies  Allergen Reactions  . Effexor [Venlafaxine] Anaphylaxis  . Diazepam     Other reaction(s): Unknown  . Synvisc [Hylan G-F 20] Other (See Comments)    Myalgias, hand pain, back pain; Benadryl helped pain  . Tramadol Nausea Only     Current Outpatient Medications:  .   acetaminophen (TYLENOL) 500 MG tablet, Take 1,000 mg by mouth every 6 (six) hours as needed., Disp: , Rfl:  .  cyclobenzaprine (FLEXERIL) 5 MG tablet, Take by mouth., Disp: , Rfl:  .  diltiazem (DILTIAZEM CD) 120 MG 24 hr capsule, Take 1 capsule (120 mg total) by mouth daily., Disp: 30 capsule, Rfl: 0 .  meloxicam (MOBIC) 15 MG tablet, , Disp: , Rfl: 1  Review of Systems  Constitutional: Negative.   Respiratory: Positive for shortness of breath.   Cardiovascular: Positive for leg swelling.  Genitourinary: Negative.   Neurological: Positive for headaches.    Social History   Tobacco Use  . Smoking status: Never Smoker  . Smokeless tobacco: Never Used  Substance Use Topics  . Alcohol use: Yes    Comment: Occasional   Objective:   BP (!) 150/86 (BP Location: Left Arm, Patient Position: Sitting, Cuff Size: Large)   Pulse 97   Temp 98.3 F (36.8 C) (Oral)   Resp 16   Wt 203 lb (92.1 kg)   SpO2 99%   BMI 33.78 kg/m  Vitals:   05/24/18 1625  BP: (!) 150/86  Pulse: 97  Resp: 16  Temp: 98.3 F (36.8 C)  TempSrc: Oral  SpO2: 99%  Weight: 203 lb (92.1 kg)     Physical Exam  Constitutional: She appears well-developed and well-nourished. No distress.  Neck: Normal range of motion. Neck supple.  Cardiovascular: Normal rate, regular rhythm, normal heart sounds and intact distal pulses. Exam reveals no gallop and no friction rub.  No murmur  heard. Pulmonary/Chest: Effort normal and breath sounds normal. No respiratory distress. She has no wheezes. She has no rales.  Musculoskeletal: She exhibits no edema.  Skin: She is not diaphoretic.  Vitals reviewed.       Assessment & Plan:     1. Essential hypertension Elevated. Increase diltiazem as below. I will see her back in 4 weeks to recheck BP.  - diltiazem (DILACOR XR) 240 MG 24 hr capsule; Take 1 capsule (240 mg total) by mouth daily.  Dispense: 30 capsule; Refill: 1  2. Urinary frequency UA normal now.  - POCT  urinalysis dipstick  3. BMI 33.0-33.9,adult Counseled patient on healthy lifestyle modifications including dieting and exercise.   4. Need for influenza vaccination Flu vaccine given today without complication. Patient sat upright for 15 minutes to check for adverse reaction before being released.       Mar Daring, PA-C  [facility]

## 2018-06-02 DIAGNOSIS — Z1231 Encounter for screening mammogram for malignant neoplasm of breast: Secondary | ICD-10-CM | POA: Diagnosis not present

## 2018-06-20 ENCOUNTER — Encounter: Payer: Self-pay | Admitting: Physician Assistant

## 2018-06-20 ENCOUNTER — Ambulatory Visit (INDEPENDENT_AMBULATORY_CARE_PROVIDER_SITE_OTHER): Payer: BLUE CROSS/BLUE SHIELD | Admitting: Physician Assistant

## 2018-06-20 ENCOUNTER — Other Ambulatory Visit (HOSPITAL_COMMUNITY)
Admission: RE | Admit: 2018-06-20 | Discharge: 2018-06-20 | Disposition: A | Discharge status: Home / Self Care | Payer: BLUE CROSS/BLUE SHIELD | Source: Ambulatory Visit | Attending: Physician Assistant | Admitting: Physician Assistant

## 2018-06-20 VITALS — BP 150/88 | HR 67 | Temp 98.5°F | Wt 204.4 lb

## 2018-06-20 DIAGNOSIS — E0590 Thyrotoxicosis, unspecified without thyrotoxic crisis or storm: Secondary | ICD-10-CM

## 2018-06-20 DIAGNOSIS — Z0000 Encounter for general adult medical examination without abnormal findings: Secondary | ICD-10-CM | POA: Diagnosis not present

## 2018-06-20 DIAGNOSIS — Z124 Encounter for screening for malignant neoplasm of cervix: Secondary | ICD-10-CM | POA: Diagnosis not present

## 2018-06-20 DIAGNOSIS — Z2821 Immunization not carried out because of patient refusal: Secondary | ICD-10-CM

## 2018-06-20 DIAGNOSIS — Z1211 Encounter for screening for malignant neoplasm of colon: Secondary | ICD-10-CM | POA: Diagnosis not present

## 2018-06-20 DIAGNOSIS — Z6834 Body mass index (BMI) 34.0-34.9, adult: Secondary | ICD-10-CM | POA: Diagnosis not present

## 2018-06-20 DIAGNOSIS — E785 Hyperlipidemia, unspecified: Secondary | ICD-10-CM | POA: Insufficient documentation

## 2018-06-20 DIAGNOSIS — E782 Mixed hyperlipidemia: Secondary | ICD-10-CM | POA: Diagnosis not present

## 2018-06-20 DIAGNOSIS — M5412 Radiculopathy, cervical region: Secondary | ICD-10-CM

## 2018-06-20 DIAGNOSIS — K5900 Constipation, unspecified: Secondary | ICD-10-CM

## 2018-06-20 DIAGNOSIS — I10 Essential (primary) hypertension: Secondary | ICD-10-CM | POA: Diagnosis not present

## 2018-06-20 DIAGNOSIS — E1169 Type 2 diabetes mellitus with other specified complication: Secondary | ICD-10-CM | POA: Insufficient documentation

## 2018-06-20 MED ORDER — CYCLOBENZAPRINE HCL 5 MG PO TABS: 5.0000 mg | ORAL_TABLET | Freq: Every day | ORAL | 1 refills | Status: DC

## 2018-06-20 MED ORDER — DILTIAZEM HCL ER 240 MG PO CP24
240.0000 mg | ORAL_CAPSULE | Freq: Every day | ORAL | 1 refills | Status: DC
Start: 2018-06-20 — End: 2018-08-30

## 2018-06-20 NOTE — Progress Notes (Signed)
Patient: Molly Potter, Female    DOB: Jun 19, 1956, 62 y.o.   MRN: [id number] Visit Date: 06/20/2018  Today's Provider: Mar Daring, PA-C   Chief Complaint  Patient presents with  . Annual Exam  . Hypertension   HPI  Subjective:    Annual physical exam Molly Potter First is a 62 y.o. female who presents today for health maintenance and complete physical. She feels well. She reports exercising currently. She reports she is sleeping fairly well.  Last CPE:06/15/17 Pap:06/15/17-Unsatisfactory, HPV-Negative Colonoscopy: Never Mammogram:06/02/2018 -----------------------------------------------------------------  Hypertension, follow-up:  BP Readings from Last 3 Encounters:  06/20/18 (!) 150/88  05/24/18 (!) 150/86  05/01/18 (!) 170/100    She was last seen for hypertension 4 weeks ago.  BP at that visit was 150/86. Management since that visit includes Increase Diltiazem. She reports excellent compliance with treatment. She is not having side effects.  She is exercising. She is adherent to low salt diet.   Outside blood pressures have not been checked in the last 3 days per patient. She is experiencing fatigue.  Patient denies chest pain.   Cardiovascular risk factors include hypertension.  Use of agents associated with hypertension: none.     Weight trend: stable Wt Readings from Last 3 Encounters:  06/20/18 204 lb 6.4 oz (92.7 kg)  05/24/18 203 lb (92.1 kg)  05/01/18 203 lb 12.8 oz (92.4 kg)    Current diet: in general, an "unhealthy" diet  ------------------------------------------------------------------------   Review of Systems  Constitutional: Negative.   HENT: Negative.   Eyes: Positive for itching.  Respiratory: Negative.   Cardiovascular: Negative.   Gastrointestinal: Positive for abdominal distention, constipation and diarrhea.  Endocrine: Negative.   Genitourinary: Positive for difficulty urinating.  Musculoskeletal: Positive for  arthralgias.  Skin: Negative.   Allergic/Immunologic: Negative.   Neurological: Negative.   Hematological: Negative.   Psychiatric/Behavioral: Negative.     Social History      She  reports that she has never smoked. She has never used smokeless tobacco. She reports that she drinks alcohol. She reports that she does not use drugs.       Social History   Socioeconomic History  . Marital status: Married    Spouse name: Not on file  . Number of children: 4  . Years of education: GED  . Highest education level: Not on file  Occupational History    Employer: [employer]  Social Needs  . Financial resource strain: Not on file  . Food insecurity:    Worry: Not on file    Inability: Not on file  . Transportation needs:    Medical: Not on file    Non-medical: Not on file  Tobacco Use  . Smoking status: Never Smoker  . Smokeless tobacco: Never Used  Substance and Sexual Activity  . Alcohol use: Yes    Comment: Occasional  . Drug use: No  . Sexual activity: Not on file  Lifestyle  . Physical activity:    Days per week: Not on file    Minutes per session: Not on file  . Stress: Not on file  Relationships  . Social connections:    Talks on phone: Not on file    Gets together: Not on file    Attends religious service: Not on file    Active member of club or organization: Not on file    Attends meetings of clubs or organizations: Not on file    Relationship status: Not  on file  Other Topics Concern  . Not on file  Social History Narrative  . Not on file    Past Medical History:  Diagnosis Date  . Arthritis   . Fatigue   . History of eczema   . Hyperthyroidism   . Joint pain      Patient Active Problem List   Diagnosis Date Noted  . Primary osteoarthritis of both knees 02/16/2017  . Impingement syndrome of shoulder 07/02/2015  . Complete rotator cuff rupture of left shoulder 07/02/2015  . Cervical radiculitis 07/02/2015  . Impingement syndrome of both  shoulders 07/02/2015  . Joint pain 04/10/2015  . Arthritis sicca 04/09/2015  . Accumulation of fluid in tissues 04/09/2015  . Essential hypertension 04/09/2015  . H/O eczema 04/09/2015  . Hyperthyroidism 04/09/2015  . Arthritis 04/09/2015  . Fatigue 04/09/2015    Past Surgical History:  Procedure Laterality Date  . ROTATOR CUFF REPAIR Right   . TUBAL LIGATION      Family History        Family Status  Relation Name Status  . Mother  Deceased  . Father  Deceased  . Sister  Alive  . Brother  Alive  . MGM  Deceased  . PGF  Deceased  . Sister  Alive  . Sister  Alive  . Sister  Alive  . Brother  Alive  . Brother  Alive        Her family history includes CAD in her mother; Diabetes in her maternal grandmother and paternal grandfather; Hypertension in her father; Lung cancer in her father; Throat cancer in her mother.      Allergies  Allergen Reactions  . Effexor [Venlafaxine] Anaphylaxis  . Diazepam     Other reaction(s): Unknown  . Synvisc [Hylan G-F 20] Other (See Comments)    Myalgias, hand pain, back pain; Benadryl helped pain  . Tramadol Nausea Only     Current Outpatient Medications:  .  acetaminophen (TYLENOL) 500 MG tablet, Take 1,000 mg by mouth every 6 (six) hours as needed., Disp: , Rfl:  .  cyclobenzaprine (FLEXERIL) 5 MG tablet, Take by mouth., Disp: , Rfl:  .  diltiazem (DILACOR XR) 240 MG 24 hr capsule, Take 1 capsule (240 mg total) by mouth daily., Disp: 30 capsule, Rfl: 1 .  meloxicam (MOBIC) 15 MG tablet, , Disp: , Rfl: 1   Patient Care Team: Mar Daring, PA-C as PCP - General (Family Medicine)      Objective:   Vitals: BP (!) 150/88 (BP Location: Right Arm, Patient Position: Sitting, Cuff Size: Normal)   Pulse 67   Temp 98.5 F (36.9 C) (Oral)   Wt 204 lb 6.4 oz (92.7 kg)   SpO2 95%   BMI 34.01 kg/m    Vitals:   06/20/18 1511  BP: (!) 150/88  Pulse: 67  Temp: 98.5 F (36.9 C)  TempSrc: Oral  SpO2: 95%  Weight: 204 lb 6.4  oz (92.7 kg)     Physical Exam  Constitutional: She is oriented to person, place, and time. She appears well-developed and well-nourished. No distress.  HENT:  Head: Normocephalic and atraumatic.  Right Ear: Hearing, tympanic membrane, external ear and ear canal normal.  Left Ear: Hearing, tympanic membrane, external ear and ear canal normal.  Nose: Nose normal.  Mouth/Throat: Uvula is midline, oropharynx is clear and moist and mucous membranes are normal. No oropharyngeal exudate.  Eyes: Pupils are equal, round, and reactive to light. Conjunctivae and EOM are normal.  Right eye exhibits no discharge. Left eye exhibits no discharge. No scleral icterus.  Neck: Normal range of motion. Neck supple. No JVD present. Carotid bruit is not present. No tracheal deviation present. No thyromegaly present.  Cardiovascular: Normal rate, regular rhythm, normal heart sounds and intact distal pulses. Exam reveals no gallop and no friction rub.  No murmur heard. Pulmonary/Chest: Effort normal and breath sounds normal. No respiratory distress. She has no wheezes. She has no rales. She exhibits no tenderness. Right breast exhibits no inverted nipple, no mass, no nipple discharge, no skin change and no tenderness. Left breast exhibits no inverted nipple, no mass, no nipple discharge, no skin change and no tenderness. No breast tenderness, discharge or bleeding. Breasts are symmetrical.  Abdominal: Soft. Bowel sounds are normal. She exhibits no distension and no mass. There is no tenderness. There is no rebound and no guarding. Hernia confirmed negative in the right inguinal area and confirmed negative in the left inguinal area.  Genitourinary: Rectum normal, vagina normal and uterus normal. No breast tenderness, discharge or bleeding. Pelvic exam was performed with patient supine. There is no rash, tenderness, lesion or injury on the right labia. There is no rash, tenderness, lesion or injury on the left labia. Cervix  exhibits no motion tenderness, no discharge and no friability. Right adnexum displays no mass, no tenderness and no fullness. Left adnexum displays no mass, no tenderness and no fullness. No erythema, tenderness or bleeding in the vagina. No signs of injury around the vagina. No vaginal discharge found.  Musculoskeletal: Normal range of motion. She exhibits no edema or tenderness.  Lymphadenopathy:    She has no cervical adenopathy.       Right: No inguinal adenopathy present.       Left: No inguinal adenopathy present.  Neurological: She is alert and oriented to person, place, and time. She has normal reflexes. No cranial nerve deficit. Coordination normal.  Skin: Skin is warm and dry. No rash noted. She is not diaphoretic.  Psychiatric: She has a normal mood and affect. Her behavior is normal. Judgment and thought content normal.  Vitals reviewed.    Depression Screen PHQ 2/9 Scores 06/20/2018 06/15/2017 02/16/2017  PHQ - 2 Score 0 0 0  PHQ- 9 Score - - 3      Assessment & Plan:     Routine Health Maintenance and Physical Exam  Exercise Activities and Dietary recommendations Goals   None     Immunization History  Administered Date(s) Administered  . Influenza,inj,Quad PF,6+ Mos 07/03/2014  . Zoster 11/08/2011    Health Maintenance  Topic Date Due  . HIV Screening  07/05/1971  . TETANUS/TDAP  07/05/1975  . MAMMOGRAM  07/04/2006  . COLONOSCOPY  07/04/2006  . INFLUENZA VACCINE  05/13/2019 (Originally 04/13/2018)  . PAP SMEAR  06/15/2020  . Hepatitis C Screening  Completed     Discussed health benefits of physical activity, and encouraged her to engage in regular exercise appropriate for her age and condition.    1. Annual physical exam Normal physical exam today. Will check labs as below and f/u pending lab results. If labs are stable and WNL she will not need to have these rechecked for one year at her next annual physical exam. She is to call the office in the meantime  if she has any acute issue, questions or concerns. - CBC with Differential/Platelet - Comprehensive metabolic panel - Hemoglobin A1c - Lipid panel - TSH  2. Cervical cancer screening Pap collected today.  Will send as below and f/u pending results. - Cytology - PAP  3. Colon cancer screening Cologuard ordered for colon cancer screen. No previous colonoscopy and no known family history.  - Cologuard  4. Essential hypertension Stable. Diagnosis pulled for medication refill. Continue current medical treatment plan. Will check labs as below and f/u pending results. - CBC with Differential/Platelet - Comprehensive metabolic panel - Hemoglobin A1c - Lipid panel - TSH - diltiazem (DILACOR XR) 240 MG 24 hr capsule; Take 1 capsule (240 mg total) by mouth daily.  Dispense: 30 capsule; Refill: 1  5. Hyperthyroidism Stable. Asymptomatic. Will check labs as below and f/u pending results. - CBC with Differential/Platelet - Comprehensive metabolic panel - Hemoglobin A1c - Lipid panel - TSH  6. BMI 34.0-34.9,adult Counseled patient on healthy lifestyle modifications including dieting and exercise. Patient attending water aerobics twice weekly. - CBC with Differential/Platelet - Comprehensive metabolic panel - Hemoglobin A1c - Lipid panel - TSH  7. Moderate mixed hyperlipidemia not requiring statin therapy Diet controlled. Will check labs as below and f/u pending results. - Comprehensive metabolic panel - Hemoglobin A1c - Lipid panel  8. Cervical radiculitis Stable. Diagnosis pulled for medication refill. Continue current medical treatment plan. - cyclobenzaprine (FLEXERIL) 5 MG tablet; Take 1 tablet (5 mg total) by mouth at bedtime.  Dispense: 90 tablet; Refill: 1  9. Constipation, unspecified constipation type Started Saturday. No BM since. Had diarrhea approx 1-2 weeks prior. Started a probiotic after diarrhea and now with constipation and bloating. Advised to add miralax one  capful daily and adjust up or down as needed depending on stool consistency.   10. Influenza vaccination declined  --------------------------------------------------------------------    Mar Daring, PA-C  [facility]

## 2018-06-20 NOTE — Patient Instructions (Addendum)
Miralax for constipation  Health Maintenance for Postmenopausal Women Menopause is a normal process in which your reproductive ability comes to an end. This process happens gradually over a span of months to years, usually between the ages of 36 and 51. Menopause is complete when you have missed 12 consecutive menstrual periods. It is important to talk with your health care provider about some of the most common conditions that affect postmenopausal women, such as heart disease, cancer, and bone loss (osteoporosis). Adopting a healthy lifestyle and getting preventive care can help to promote your health and wellness. Those actions can also lower your chances of developing some of these common conditions. What should I know about menopause? During menopause, you may experience a number of symptoms, such as:  Moderate-to-severe hot flashes.  Night sweats.  Decrease in sex drive.  Mood swings.  Headaches.  Tiredness.  Irritability.  Memory problems.  Insomnia.  Choosing to treat or not to treat menopausal changes is an individual decision that you make with your health care provider. What should I know about hormone replacement therapy and supplements? Hormone therapy products are effective for treating symptoms that are associated with menopause, such as hot flashes and night sweats. Hormone replacement carries certain risks, especially as you become older. If you are thinking about using estrogen or estrogen with progestin treatments, discuss the benefits and risks with your health care provider. What should I know about heart disease and stroke? Heart disease, heart attack, and stroke become more likely as you age. This may be due, in part, to the hormonal changes that your body experiences during menopause. These can affect how your body processes dietary fats, triglycerides, and cholesterol. Heart attack and stroke are both medical emergencies. There are many things that you can do  to help prevent heart disease and stroke:  Have your blood pressure checked at least every 1-2 years. High blood pressure causes heart disease and increases the risk of stroke.  If you are 39-36 years old, ask your health care provider if you should take aspirin to prevent a heart attack or a stroke.  Do not use any tobacco products, including cigarettes, chewing tobacco, or electronic cigarettes. If you need help quitting, ask your health care provider.  It is important to eat a healthy diet and maintain a healthy weight. ? Be sure to include plenty of vegetables, fruits, low-fat dairy products, and lean protein. ? Avoid eating foods that are high in solid fats, added sugars, or salt (sodium).  Get regular exercise. This is one of the most important things that you can do for your health. ? Try to exercise for at least 150 minutes each week. The type of exercise that you do should increase your heart rate and make you sweat. This is known as moderate-intensity exercise. ? Try to do strengthening exercises at least twice each week. Do these in addition to the moderate-intensity exercise.  Know your numbers.Ask your health care provider to check your cholesterol and your blood glucose. Continue to have your blood tested as directed by your health care provider.  What should I know about cancer screening? There are several types of cancer. Take the following steps to reduce your risk and to catch any cancer development as early as possible. Breast Cancer  Practice breast self-awareness. ? This means understanding how your breasts normally appear and feel. ? It also means doing regular breast self-exams. Let your health care provider know about any changes, no matter how small.  If you are 40 or older, have a clinician do a breast exam (clinical breast exam or CBE) every year. Depending on your age, family history, and medical history, it may be recommended that you also have a yearly breast  X-ray (mammogram).  If you have a family history of breast cancer, talk with your health care provider about genetic screening.  If you are at high risk for breast cancer, talk with your health care provider about having an MRI and a mammogram every year.  Breast cancer (BRCA) gene test is recommended for women who have family members with BRCA-related cancers. Results of the assessment will determine the need for genetic counseling and BRCA1 and for BRCA2 testing. BRCA-related cancers include these types: ? Breast. This occurs in males or females. ? Ovarian. ? Tubal. This may also be called fallopian tube cancer. ? Cancer of the abdominal or pelvic lining (peritoneal cancer). ? Prostate. ? Pancreatic.  Cervical, Uterine, and Ovarian Cancer Your health care provider may recommend that you be screened regularly for cancer of the pelvic organs. These include your ovaries, uterus, and vagina. This screening involves a pelvic exam, which includes checking for microscopic changes to the surface of your cervix (Pap test).  For women ages 21-65, health care providers may recommend a pelvic exam and a Pap test every three years. For women ages 30-65, they may recommend the Pap test and pelvic exam, combined with testing for human papilloma virus (HPV), every five years. Some types of HPV increase your risk of cervical cancer. Testing for HPV may also be done on women of any age who have unclear Pap test results.  Other health care providers may not recommend any screening for nonpregnant women who are considered low risk for pelvic cancer and have no symptoms. Ask your health care provider if a screening pelvic exam is right for you.  If you have had past treatment for cervical cancer or a condition that could lead to cancer, you need Pap tests and screening for cancer for at least 20 years after your treatment. If Pap tests have been discontinued for you, your risk factors (such as having a new sexual  partner) need to be reassessed to determine if you should start having screenings again. Some women have medical problems that increase the chance of getting cervical cancer. In these cases, your health care provider may recommend that you have screening and Pap tests more often.  If you have a family history of uterine cancer or ovarian cancer, talk with your health care provider about genetic screening.  If you have vaginal bleeding after reaching menopause, tell your health care provider.  There are currently no reliable tests available to screen for ovarian cancer.  Lung Cancer Lung cancer screening is recommended for adults 55-80 years old who are at high risk for lung cancer because of a history of smoking. A yearly low-dose CT scan of the lungs is recommended if you:  Currently smoke.  Have a history of at least 30 pack-years of smoking and you currently smoke or have quit within the past 15 years. A pack-year is smoking an average of one pack of cigarettes per day for one year.  Yearly screening should:  Continue until it has been 15 years since you quit.  Stop if you develop a health problem that would prevent you from having lung cancer treatment.  Colorectal Cancer  This type of cancer can be detected and can often be prevented.  Routine colorectal cancer   screening usually begins at age 50 and continues through age 75.  If you have risk factors for colon cancer, your health care provider may recommend that you be screened at an earlier age.  If you have a family history of colorectal cancer, talk with your health care provider about genetic screening.  Your health care provider may also recommend using home test kits to check for hidden blood in your stool.  A small camera at the end of a tube can be used to examine your colon directly (sigmoidoscopy or colonoscopy). This is done to check for the earliest forms of colorectal cancer.  Direct examination of the colon  should be repeated every 5-10 years until age 75. However, if early forms of precancerous polyps or small growths are found or if you have a family history or genetic risk for colorectal cancer, you may need to be screened more often.  Skin Cancer  Check your skin from head to toe regularly.  Monitor any moles. Be sure to tell your health care provider: ? About any new moles or changes in moles, especially if there is a change in a mole's shape or color. ? If you have a mole that is larger than the size of a pencil eraser.  If any of your family members has a history of skin cancer, especially at a young age, talk with your health care provider about genetic screening.  Always use sunscreen. Apply sunscreen liberally and repeatedly throughout the day.  Whenever you are outside, protect yourself by wearing long sleeves, pants, a wide-brimmed hat, and sunglasses.  What should I know about osteoporosis? Osteoporosis is a condition in which bone destruction happens more quickly than new bone creation. After menopause, you may be at an increased risk for osteoporosis. To help prevent osteoporosis or the bone fractures that can happen because of osteoporosis, the following is recommended:  If you are 19-50 years old, get at least 1,000 mg of calcium and at least 600 mg of vitamin D per day.  If you are older than age 50 but younger than age 70, get at least 1,200 mg of calcium and at least 600 mg of vitamin D per day.  If you are older than age 70, get at least 1,200 mg of calcium and at least 800 mg of vitamin D per day.  Smoking and excessive alcohol intake increase the risk of osteoporosis. Eat foods that are rich in calcium and vitamin D, and do weight-bearing exercises several times each week as directed by your health care provider. What should I know about how menopause affects my mental health? Depression may occur at any age, but it is more common as you become older. Common symptoms of  depression include:  Low or sad mood.  Changes in sleep patterns.  Changes in appetite or eating patterns.  Feeling an overall lack of motivation or enjoyment of activities that you previously enjoyed.  Frequent crying spells.  Talk with your health care provider if you think that you are experiencing depression. What should I know about immunizations? It is important that you get and maintain your immunizations. These include:  Tetanus, diphtheria, and pertussis (Tdap) booster vaccine.  Influenza every year before the flu season begins.  Pneumonia vaccine.  Shingles vaccine.  Your health care provider may also recommend other immunizations. This information is not intended to replace advice given to you by your health care provider. Make sure you discuss any questions you have with your health care   provider. Document Released: 10/22/2005 Document Revised: 03/19/2016 Document Reviewed: 06/03/2015 Elsevier Interactive Patient Education  2018 [employer].

## 2018-06-21 ENCOUNTER — Telehealth: Payer: Self-pay

## 2018-06-21 LAB — CBC WITH DIFFERENTIAL/PLATELET
BASOS ABS: 0.1 10*3/uL (ref 0.0–0.2)
Basos: 1 %
EOS (ABSOLUTE): 0.3 10*3/uL (ref 0.0–0.4)
Eos: 2 %
Hematocrit: 34.7 % (ref 34.0–46.6)
Hemoglobin: 12.6 g/dL (ref 11.1–15.9)
Immature Grans (Abs): 0 10*3/uL (ref 0.0–0.1)
Immature Granulocytes: 0 %
LYMPHS ABS: 3.9 10*3/uL — AB (ref 0.7–3.1)
Lymphs: 34 %
MCH: 32.1 pg (ref 26.6–33.0)
MCHC: 36.3 g/dL — ABNORMAL HIGH (ref 31.5–35.7)
MCV: 89 fL (ref 79–97)
MONOCYTES: 7 %
Monocytes Absolute: 0.8 10*3/uL (ref 0.1–0.9)
Neutrophils Absolute: 6.3 10*3/uL (ref 1.4–7.0)
Neutrophils: 56 %
PLATELETS: 412 10*3/uL (ref 150–450)
RBC: 3.92 x10E6/uL (ref 3.77–5.28)
RDW: 12.1 % — AB (ref 12.3–15.4)
WBC: 11.3 10*3/uL — ABNORMAL HIGH (ref 3.4–10.8)

## 2018-06-21 LAB — HEMOGLOBIN A1C
ESTIMATED AVERAGE GLUCOSE: 154 mg/dL
Hgb A1c MFr Bld: 7 % — ABNORMAL HIGH (ref 4.8–5.6)

## 2018-06-21 LAB — LIPID PANEL
CHOLESTEROL TOTAL: 181 mg/dL (ref 100–199)
Chol/HDL Ratio: 3.7 ratio (ref 0.0–4.4)
HDL: 49 mg/dL (ref >39)
LDL CALC: 116 mg/dL — AB (ref 0–99)
TRIGLYCERIDES: 81 mg/dL (ref 0–149)
VLDL CHOLESTEROL CAL: 16 mg/dL (ref 5–40)

## 2018-06-21 LAB — COMPREHENSIVE METABOLIC PANEL
ALT: 46 IU/L — AB (ref 0–32)
AST: 42 IU/L — AB (ref 0–40)
Albumin/Globulin Ratio: 1.5 (ref 1.2–2.2)
Albumin: 4.3 g/dL (ref 3.6–4.8)
Alkaline Phosphatase: 78 IU/L (ref 39–117)
BUN/Creatinine Ratio: 16 (ref 12–28)
BUN: 12 mg/dL (ref 8–27)
Bilirubin Total: 0.4 mg/dL (ref 0.0–1.2)
CALCIUM: 9.5 mg/dL (ref 8.7–10.3)
CO2: 24 mmol/L (ref 20–29)
CREATININE: 0.77 mg/dL (ref 0.57–1.00)
Chloride: 101 mmol/L (ref 96–106)
GFR, EST AFRICAN AMERICAN: 96 mL/min/{1.73_m2} (ref >59)
GFR, EST NON AFRICAN AMERICAN: 84 mL/min/{1.73_m2} (ref >59)
GLUCOSE: 109 mg/dL — AB (ref 65–99)
Globulin, Total: 2.8 g/dL (ref 1.5–4.5)
Potassium: 3.8 mmol/L (ref 3.5–5.2)
Sodium: 141 mmol/L (ref 134–144)
TOTAL PROTEIN: 7.1 g/dL (ref 6.0–8.5)

## 2018-06-21 LAB — TSH: TSH: 4.46 u[IU]/mL (ref 0.450–4.500)

## 2018-06-21 NOTE — Telephone Encounter (Signed)
Faxed orders to cologuard on this patient with insurance information.

## 2018-06-22 ENCOUNTER — Telehealth: Payer: Self-pay

## 2018-06-22 NOTE — Telephone Encounter (Signed)
-----   Message from Mar Daring, [place] sent at 06/22/2018  4:16 PM EDT ----- Elevated WBC count that may indicate a viral infection or UTI. Does she have any symptoms of either? Liver enzymes also borderline elevated when compared to last year. This could be secondary to stress, infection, increased tylenol intake, alcohol or fatty food intake. Kidney function is normal. Sugar is elevated and does now indicate diabetic range. I would recommend she come back in for an appt to discuss this (40 min appt please). Cholesterol is stable. Thyroid is stable.

## 2018-06-22 NOTE — Telephone Encounter (Signed)
Patient advised as directed below. Scheduled patient to come in 10/21. Per patient she think she may have a UTI.

## 2018-06-23 LAB — CYTOLOGY - PAP
ADEQUACY: ABSENT
DIAGNOSIS: NEGATIVE
HPV (WINDOPATH): NOT DETECTED

## 2018-06-23 NOTE — Telephone Encounter (Signed)
Pt returned missed call.  Pt states she didn't get a voicemail from office. Please call pt back to discuss.  Thanks, [employer]

## 2018-06-23 NOTE — Telephone Encounter (Signed)
Have her try AZO and push fluids. If UTI symptoms persist come in for urine check

## 2018-06-23 NOTE — Telephone Encounter (Signed)
Left detailed message.   

## 2018-06-23 NOTE — Telephone Encounter (Signed)
Patient advised.

## 2018-07-03 ENCOUNTER — Ambulatory Visit: Payer: BLUE CROSS/BLUE SHIELD | Admitting: Physician Assistant

## 2018-07-03 ENCOUNTER — Encounter: Payer: Self-pay | Admitting: Physician Assistant

## 2018-07-03 VITALS — BP 138/80 | HR 60 | Temp 98.2°F | Resp 16 | Wt 201.8 lb

## 2018-07-03 DIAGNOSIS — E119 Type 2 diabetes mellitus without complications: Secondary | ICD-10-CM | POA: Insufficient documentation

## 2018-07-03 DIAGNOSIS — Z6833 Body mass index (BMI) 33.0-33.9, adult: Secondary | ICD-10-CM

## 2018-07-03 LAB — POCT UA - MICROALBUMIN: Microalbumin Ur, POC: NEGATIVE mg/L

## 2018-07-03 NOTE — Progress Notes (Signed)
Patient: Molly Potter Female    DOB: 1956/06/27   62 y.o.   MRN: [id number] Visit Date: 07/03/2018  Today's Provider: Mar Daring, PA-C   Chief Complaint  Patient presents with  . Diabetes   Subjective:    HPI  Molly Potter is a 62 yr old patient here to discuss treatment options for diabetes, recently diagnosed from labs 2 weeks ago. Patient reports that she has cut down on sweets. She has lost 2 pounds since being told she had diabetes. She reports her diet has been very poor. She was eating out often, eating chocolates right before bed.    Lab Results  Component Value Date   HGBA1C 7.0 (H) 06/20/2018       Allergies  Allergen Reactions  . Effexor [Venlafaxine] Anaphylaxis  . Diazepam     Other reaction(s): Unknown  . Synvisc [Hylan G-F 20] Other (See Comments)    Myalgias, hand pain, back pain; Benadryl helped pain  . Tramadol Nausea Only     Current Outpatient Medications:  .  acetaminophen (TYLENOL) 500 MG tablet, Take 1,000 mg by mouth every 6 (six) hours as needed., Disp: , Rfl:  .  cyclobenzaprine (FLEXERIL) 5 MG tablet, Take 1 tablet (5 mg total) by mouth at bedtime., Disp: 90 tablet, Rfl: 1 .  diltiazem (DILACOR XR) 240 MG 24 hr capsule, Take 1 capsule (240 mg total) by mouth daily., Disp: 30 capsule, Rfl: 1 .  meloxicam (MOBIC) 15 MG tablet, , Disp: , Rfl: 1  Review of Systems  Constitutional: Negative.   Respiratory: Negative.   Cardiovascular: Negative.   Gastrointestinal: Negative.   Endocrine: Positive for polydipsia ("sometimes") and polyuria ("sometimes").  Neurological: Negative.     Social History   Tobacco Use  . Smoking status: Never Smoker  . Smokeless tobacco: Never Used  Substance Use Topics  . Alcohol use: Yes    Comment: Occasional   Objective:   BP 138/80 (BP Location: Left Arm, Patient Position: Sitting, Cuff Size: Normal)   Pulse 60   Temp 98.2 F (36.8 C) (Oral)   Resp 16   Wt 201 lb 12.8 oz (91.5 kg)    SpO2 97%   BMI 33.58 kg/m  Vitals:   07/03/18 1550  BP: 138/80  Pulse: 60  Resp: 16  Temp: 98.2 F (36.8 C)  TempSrc: Oral  SpO2: 97%  Weight: 201 lb 12.8 oz (91.5 kg)     Physical Exam  Constitutional: She appears well-developed and well-nourished. No distress.  Neck: Normal range of motion. Neck supple.  Cardiovascular: Normal rate, regular rhythm and normal heart sounds. Exam reveals no gallop and no friction rub.  No murmur heard. Pulmonary/Chest: Effort normal and breath sounds normal. No respiratory distress. She has no wheezes. She has no rales.  Skin: She is not diaphoretic.  Vitals reviewed.  Diabetic Foot Exam - Simple   Simple Foot Form Diabetic Foot exam was performed with the following findings:  Yes 07/03/2018  4:38 PM  Visual Inspection No deformities, no ulcerations, no other skin breakdown bilaterally:  Yes Sensation Testing Intact to touch and monofilament testing bilaterally:  Yes Pulse Check Posterior Tibialis and Dorsalis pulse intact bilaterally:  Yes Comments       Assessment & Plan:     1. Newly diagnosed diabetes ([place]) Urine microalbumin normal. A1c 7.0 new. Had a long discussion with patient about diabetes diagnosis, nutrition, exercise, and progression. Information printed. Offered the diabetic education course but  patient declines at this time. Also declines starting a statin and pneumonia vaccination at this time. She is going to try lifestyle modifications with dieting and exercise. She will return in 6 months for recheck. Call if any complications or questions arise in the meantime.  - POCT UA - Microalbumin  2. BMI 33.0-33.9,adult Counseled patient on healthy lifestyle modifications including dieting and exercise.   I spent approximately 45 minutes with the patient today. Over 50% of this time was spent with counseling and educating the patient.       Mar Daring, PA-C  [facility]

## 2018-07-03 NOTE — Patient Instructions (Signed)
Type 2 Diabetes Mellitus, Diagnosis, Adult Type 2 diabetes (type 2 diabetes mellitus) is a long-term (chronic) disease. It may be caused by one or both of these problems:  Your body does not make enough of a hormone called insulin.  Your body does not react in a normal way to insulin that it makes.  Insulin lets sugars (glucose) go into cells in the body. This gives you energy. If you have type 2 diabetes, sugars cannot get into cells. This causes high blood sugar (hyperglycemia). Your doctor will set treatment goals for you. Generally, you should have these blood sugar levels:  Before meals (preprandial): 80-130 mg/dL (4.4-7.2 mmol/L).  After meals (postprandial): below 180 mg/dL (10 mmol/L).  A1c (hemoglobin A1c) level: less than 7%.  Follow these instructions at home: Questions to Ask Your Doctor  You may want to ask these questions:  Do I need to meet with a diabetes educator?  Where can I find a support group for people with diabetes?  What equipment will I need to care for myself at home?  What diabetes medicines do I need? When should I take them?  How often do I need to check my blood sugar?  What number can I call if I have questions?  When is my next doctor's visit?  General instructions  Take over-the-counter and prescription medicines only as told by your doctor.  Keep all follow-up visits as told by your doctor. This is important. Contact a doctor if:  Your blood sugar is at or above 240 mg/dL (13.3 mmol/L) for 2 days in a row.  You have been sick or have had a fever for 2 days or more and you are not getting better.  You have any of these problems for more than 6 hours: ? You cannot eat or drink. ? You feel sick to your stomach (nauseous). ? You throw up (vomit). ? You have watery poop (diarrhea). Get help right away if:  Your blood sugar is lower than 54 mg/dL (3 mmol/L).  You get confused.  You have trouble: ? Thinking  clearly. ? Breathing.  You have moderate or large ketone levels in your pee (urine). This information is not intended to replace advice given to you by your health care provider. Make sure you discuss any questions you have with your health care provider. Document Released: 06/08/2008 Document Revised: 02/05/2016 Document Reviewed: 10/03/2015 Elsevier Interactive Patient Education  2018 [employer].  Diabetes Mellitus and Nutrition When you have diabetes (diabetes mellitus), it is very important to have healthy eating habits because your blood sugar (glucose) levels are greatly affected by what you eat and drink. Eating healthy foods in the appropriate amounts, at about the same times every day, can help you:  Control your blood glucose.  Lower your risk of heart disease.  Improve your blood pressure.  Reach or maintain a healthy weight.  Every person with diabetes is different, and each person has different needs for a meal plan. Your health care provider may recommend that you work with a diet and nutrition specialist (dietitian) to make a meal plan that is best for you. Your meal plan may vary depending on factors such as:  The calories you need.  The medicines you take.  Your weight.  Your blood glucose, blood pressure, and cholesterol levels.  Your activity level.  Other health conditions you have, such as heart or kidney disease.  How do carbohydrates affect me? Carbohydrates affect your blood glucose level more than any  other type of food. Eating carbohydrates naturally increases the amount of glucose in your blood. Carbohydrate counting is a method for keeping track of how many carbohydrates you eat. Counting carbohydrates is important to keep your blood glucose at a healthy level, especially if you use insulin or take certain oral diabetes medicines. It is important to know how many carbohydrates you can safely have in each meal. This is different for every person. Your  dietitian can help you calculate how many carbohydrates you should have at each meal and for snack. Foods that contain carbohydrates include:  Bread, cereal, rice, pasta, and crackers.  Potatoes and corn.  Peas, beans, and lentils.  Milk and yogurt.  Fruit and juice.  Desserts, such as cakes, cookies, ice cream, and candy.  How does alcohol affect me? Alcohol can cause a sudden decrease in blood glucose (hypoglycemia), especially if you use insulin or take certain oral diabetes medicines. Hypoglycemia can be a life-threatening condition. Symptoms of hypoglycemia (sleepiness, dizziness, and confusion) are similar to symptoms of having too much alcohol. If your health care provider says that alcohol is safe for you, follow these guidelines:  Limit alcohol intake to no more than 1 drink per day for nonpregnant women and 2 drinks per day for men. One drink equals 12 oz of beer, 5 oz of wine, or 1 oz of hard liquor.  Do not drink on an empty stomach.  Keep yourself hydrated with water, diet soda, or unsweetened iced tea.  Keep in mind that regular soda, juice, and other mixers may contain a lot of sugar and must be counted as carbohydrates.  What are tips for following this plan? Reading food labels  Start by checking the serving size on the label. The amount of calories, carbohydrates, fats, and other nutrients listed on the label are based on one serving of the food. Many foods contain more than one serving per package.  Check the total grams (g) of carbohydrates in one serving. You can calculate the number of servings of carbohydrates in one serving by dividing the total carbohydrates by 15. For example, if a food has 30 g of total carbohydrates, it would be equal to 2 servings of carbohydrates.  Check the number of grams (g) of saturated and trans fats in one serving. Choose foods that have low or no amount of these fats.  Check the number of milligrams (mg) of sodium in one  serving. Most people should limit total sodium intake to less than 2,300 mg per day.  Always check the nutrition information of foods labeled as "low-fat" or "nonfat". These foods may be higher in added sugar or refined carbohydrates and should be avoided.  Talk to your dietitian to identify your daily goals for nutrients listed on the label. Shopping  Avoid buying canned, premade, or processed foods. These foods tend to be high in fat, sodium, and added sugar.  Shop around the outside edge of the grocery store. This includes fresh fruits and vegetables, bulk grains, fresh meats, and fresh dairy. Cooking  Use low-heat cooking methods, such as baking, instead of high-heat cooking methods like deep frying.  Cook using healthy oils, such as olive, canola, or sunflower oil.  Avoid cooking with butter, cream, or high-fat meats. Meal planning  Eat meals and snacks regularly, preferably at the same times every day. Avoid going long periods of time without eating.  Eat foods high in fiber, such as fresh fruits, vegetables, beans, and whole grains. Talk to your dietitian  about how many servings of carbohydrates you can eat at each meal.  Eat 4-6 ounces of lean protein each day, such as lean meat, chicken, fish, eggs, or tofu. 1 ounce is equal to 1 ounce of meat, chicken, or fish, 1 egg, or 1/4 cup of tofu.  Eat some foods each day that contain healthy fats, such as avocado, nuts, seeds, and fish. Lifestyle   Check your blood glucose regularly.  Exercise at least 30 minutes 5 or more days each week, or as told by your health care provider.  Take medicines as told by your health care provider.  Do not use any products that contain nicotine or tobacco, such as cigarettes and e-cigarettes. If you need help quitting, ask your health care provider.  Work with a Social worker or diabetes educator to identify strategies to manage stress and any emotional and social challenges. What are some  questions to ask my health care provider?  Do I need to meet with a diabetes educator?  Do I need to meet with a dietitian?  What number can I call if I have questions?  When are the best times to check my blood glucose? Where to find more information:  American Diabetes Association: diabetes.org/food-and-fitness/food  Academy of Nutrition and Dietetics: PokerClues.dk  [employer] of Diabetes and Digestive and Kidney Diseases (NIH): ContactWire.be Summary  A healthy meal plan will help you control your blood glucose and maintain a healthy lifestyle.  Working with a diet and nutrition specialist (dietitian) can help you make a meal plan that is best for you.  Keep in mind that carbohydrates and alcohol have immediate effects on your blood glucose levels. It is important to count carbohydrates and to use alcohol carefully. This information is not intended to replace advice given to you by your health care provider. Make sure you discuss any questions you have with your health care provider. Document Released: 05/27/2005 Document Revised: 10/04/2016 Document Reviewed: 10/04/2016 Elsevier Interactive Patient Education  2018 [employer].   Carbohydrate Counting for Diabetes Mellitus, Adult Carbohydrate counting is a method for keeping track of how many carbohydrates you eat. Eating carbohydrates naturally increases the amount of sugar (glucose) in the blood. Counting how many carbohydrates you eat helps keep your blood glucose within normal limits, which helps you manage your diabetes (diabetes mellitus). It is important to know how many carbohydrates you can safely have in each meal. This is different for every person. A diet and nutrition specialist (registered dietitian) can help you make a meal plan and calculate how many carbohydrates you should have at  each meal and snack. Carbohydrates are found in the following foods:  Grains, such as breads and cereals.  Dried beans and soy products.  Starchy vegetables, such as potatoes, peas, and corn.  Fruit and fruit juices.  Milk and yogurt.  Sweets and snack foods, such as cake, cookies, candy, chips, and soft drinks.  How do I count carbohydrates? There are two ways to count carbohydrates in food. You can use either of the methods or a combination of both. Reading "Nutrition Facts" on packaged food The "Nutrition Facts" list is included on the labels of almost all packaged foods and beverages in the [place] It includes:  The serving size.  Information about nutrients in each serving, including the grams (g) of carbohydrate per serving.  To use the "Nutrition Facts":  Decide how many servings you will have.  Multiply the number of servings by the number of carbohydrates per serving.  The resulting number is the total amount of carbohydrates that you will be having.  Learning standard serving sizes of other foods When you eat foods containing carbohydrates that are not packaged or do not include "Nutrition Facts" on the label, you need to measure the servings in order to count the amount of carbohydrates:  Measure the foods that you will eat with a food scale or measuring cup, if needed.  Decide how many standard-size servings you will eat.  Multiply the number of servings by 15. Most carbohydrate-rich foods have about 15 g of carbohydrates per serving. ? For example, if you eat 8 oz (170 g) of strawberries, you will have eaten 2 servings and 30 g of carbohydrates (2 servings x 15 g = 30 g).  For foods that have more than one food mixed, such as soups and casseroles, you must count the carbohydrates in each food that is included.  The following list contains standard serving sizes of common carbohydrate-rich foods. Each of these servings has about 15 g of carbohydrates:    hamburger bun or  English muffin.   oz (15 mL) syrup.   oz (14 g) jelly.  1 slice of bread.  1 six-inch tortilla.  3 oz (85 g) cooked rice or pasta.  4 oz (113 g) cooked dried beans.  4 oz (113 g) starchy vegetable, such as peas, corn, or potatoes.  4 oz (113 g) hot cereal.  4 oz (113 g) mashed potatoes or  of a large baked potato.  4 oz (113 g) canned or frozen fruit.  4 oz (120 mL) fruit juice.  4-6 crackers.  6 chicken nuggets.  6 oz (170 g) unsweetened dry cereal.  6 oz (170 g) plain fat-free yogurt or yogurt sweetened with artificial sweeteners.  8 oz (240 mL) milk.  8 oz (170 g) fresh fruit or one small piece of fruit.  24 oz (680 g) popped popcorn.  Example of carbohydrate counting Sample meal  3 oz (85 g) chicken breast.  6 oz (170 g) brown rice.  4 oz (113 g) corn.  8 oz (240 mL) milk.  8 oz (170 g) strawberries with sugar-free whipped topping. Carbohydrate calculation 1. Identify the foods that contain carbohydrates: ? Rice. ? Corn. ? Milk. ? Strawberries. 2. Calculate how many servings you have of each food: ? 2 servings rice. ? 1 serving corn. ? 1 serving milk. ? 1 serving strawberries. 3. Multiply each number of servings by 15 g: ? 2 servings rice x 15 g = 30 g. ? 1 serving corn x 15 g = 15 g. ? 1 serving milk x 15 g = 15 g. ? 1 serving strawberries x 15 g = 15 g. 4. Add together all of the amounts to find the total grams of carbohydrates eaten: ? 30 g + 15 g + 15 g + 15 g = 75 g of carbohydrates total. This information is not intended to replace advice given to you by your health care provider. Make sure you discuss any questions you have with your health care provider. Document Released: 08/30/2005 Document Revised: 03/19/2016 Document Reviewed: 02/11/2016 Elsevier Interactive Patient Education  2018 [place] for Eating Away From Home If You Have Diabetes Controlling your level of blood glucose, also known as  blood sugar, can be challenging. It can be even more difficult when you do not prepare your own meals. The following tips can help you manage your diabetes when you eat away from home.  Planning ahead Plan ahead if you know you will be eating away from home:  Ask your health care provider how to time meals and medicine if you are taking insulin.  Make a list of restaurants near you that offer healthy choices. If they have a carry-out menu, take it home and plan what you will order ahead of time.  Look up the restaurant you want to eat at online. Many chain and fast-food restaurants list nutritional information online. Use this information to choose the healthiest options and to calculate how many carbohydrates will be in your meal.  Use a carbohydrate-counting book or mobile app to look up the carbohydrate content and serving size of the foods you want to eat.  Become familiar with serving sizes and learn to recognize how many servings are in a portion. This will allow you to estimate how many carbohydrates you can eat.  Free foods A "free food" is any food or drink that has less than 5 g of carbohydrates per serving. Free foods include:  Many vegetables.  Hard boiled eggs.  Nuts or seeds.  Olives.  Cheeses.  Meats.  These types of foods make good appetizer choices and are often available at salad bars. Lemon juice, vinegar, or a low-calorie salad dressing of fewer than 20 calories per serving can be used as a "free" salad dressing. Choices to reduce carbohydrates  Substitute nonfat sweetened yogurt with a sugar-free yogurt. Yogurt made from soy milk may also be used, but you will still want a sugar-free or plain option to choose a lower carbohydrate amount.  Ask your server to take away the bread basket or chips from your table.  Order fresh fruit. A salad bar often offers fresh fruit choices. Avoid canned fruit because it is usually packed in sugar or syrup.  Order a salad, and  eat it without dressing. Or, create a "free" salad dressing.  Ask for substitutions. For example, instead of [place] fries, request an order of a vegetable such as salad, green beans, or broccoli. Other tips  If you take insulin, take the insulin once your food arrives to your table. This will ensure your insulin and food are timed correctly.  Ask your server about the portion size before your order, and ask for a take-out box if the portion has more servings than you should have. When your food comes, leave the amount you should have on the plate, and put the rest in the take-out box.  Consider splitting an entree with someone and ordering a side salad. This information is not intended to replace advice given to you by your health care provider. Make sure you discuss any questions you have with your health care provider. Document Released: 08/30/2005 Document Revised: 02/05/2016 Document Reviewed: 11/27/2013 Elsevier Interactive Patient Education  [employer].

## 2018-07-05 DIAGNOSIS — Z1211 Encounter for screening for malignant neoplasm of colon: Secondary | ICD-10-CM | POA: Diagnosis not present

## 2018-07-05 LAB — COLOGUARD: Cologuard: NEGATIVE

## 2018-07-13 ENCOUNTER — Telehealth: Payer: Self-pay

## 2018-07-13 NOTE — Telephone Encounter (Signed)
LM to let her know about the Cologuard results.  Cologuard: Negative. Repeat in 3 years.  Thanks,  -Aster Screws

## 2018-07-17 NOTE — Telephone Encounter (Signed)
Patient advised as directed below. 

## 2018-07-19 ENCOUNTER — Ambulatory Visit: Payer: BLUE CROSS/BLUE SHIELD | Admitting: Physician Assistant

## 2018-07-19 ENCOUNTER — Encounter: Payer: Self-pay | Admitting: Physician Assistant

## 2018-07-19 VITALS — BP 143/85 | HR 75 | Temp 97.4°F | Resp 16 | Wt 202.4 lb

## 2018-07-19 DIAGNOSIS — H66001 Acute suppurative otitis media without spontaneous rupture of ear drum, right ear: Secondary | ICD-10-CM

## 2018-07-19 DIAGNOSIS — S161XXA Strain of muscle, fascia and tendon at neck level, initial encounter: Secondary | ICD-10-CM | POA: Diagnosis not present

## 2018-07-19 MED ORDER — METHYLPREDNISOLONE 4 MG PO TABS: 4.0000 mg | ORAL_TABLET | Freq: Every day | ORAL | 0 refills | Status: DC

## 2018-07-19 MED ORDER — AMOXICILLIN 875 MG PO TABS: 875.0000 mg | ORAL_TABLET | Freq: Two times a day (BID) | ORAL | 0 refills | Status: DC

## 2018-07-19 NOTE — Progress Notes (Signed)
Patient: Molly Potter Female    DOB: 26-Jul-1956   62 y.o.   MRN: [id number] Visit Date: 07/19/2018  Today's Provider: Mar Daring, PA-C   Chief Complaint  Patient presents with  . Neck Pain   Subjective:    Neck Pain   This is a new problem. The current episode started in the past 7 days. The problem occurs daily. The problem has been gradually worsening ("Last night"). The pain is associated with nothing. The pain is present in the right side. Quality: "throbbing" Pain scale: last night 10/10. The pain is moderate. Nothing aggravates the symptoms. Pertinent negatives include no chest pain, headaches, pain with swallowing, trouble swallowing or visual change. Treatments tried: CBD cream,coricidin. The treatment provided mild relief.      Allergies  Allergen Reactions  . Effexor [Venlafaxine] Anaphylaxis  . Diazepam     Other reaction(s): Unknown  . Synvisc [Hylan G-F 20] Other (See Comments)    Myalgias, hand pain, back pain; Benadryl helped pain  . Tramadol Nausea Only     Current Outpatient Medications:  .  acetaminophen (TYLENOL) 500 MG tablet, Take 1,000 mg by mouth every 6 (six) hours as needed., Disp: , Rfl:  .  cyclobenzaprine (FLEXERIL) 5 MG tablet, Take 1 tablet (5 mg total) by mouth at bedtime., Disp: 90 tablet, Rfl: 1 .  diltiazem (DILACOR XR) 240 MG 24 hr capsule, Take 1 capsule (240 mg total) by mouth daily., Disp: 30 capsule, Rfl: 1 .  meloxicam (MOBIC) 15 MG tablet, , Disp: , Rfl: 1  Review of Systems  Constitutional: Negative.   HENT: Positive for congestion, ear pain, postnasal drip and sinus pain. Negative for sore throat and trouble swallowing.   Respiratory: Positive for cough.   Cardiovascular: Negative for chest pain.  Musculoskeletal: Positive for neck pain.  Neurological: Positive for dizziness. Negative for headaches.    Social History   Tobacco Use  . Smoking status: Never Smoker  . Smokeless tobacco: Never Used  Substance  Use Topics  . Alcohol use: Yes    Comment: Occasional   Objective:   BP (!) 143/85 (BP Location: Left Arm, Patient Position: Sitting, Cuff Size: Normal)   Pulse 75   Temp (!) 97.4 F (36.3 C) (Oral)   Resp 16   Wt 202 lb 6.4 oz (91.8 kg)   BMI 33.68 kg/m  Vitals:   07/19/18 1758  BP: (!) 143/85  Pulse: 75  Resp: 16  Temp: (!) 97.4 F (36.3 C)  TempSrc: Oral  Weight: 202 lb 6.4 oz (91.8 kg)     Physical Exam  Constitutional: She appears well-developed and well-nourished. No distress.  HENT:  Head: Normocephalic and atraumatic.  Right Ear: Hearing, external ear and ear canal normal. There is tenderness. Tympanic membrane is erythematous and bulging. A middle ear effusion is present.  Left Ear: Hearing, tympanic membrane, external ear and ear canal normal.  Nose: Mucosal edema and rhinorrhea present. Right sinus exhibits frontal sinus tenderness. Right sinus exhibits no maxillary sinus tenderness. Left sinus exhibits no maxillary sinus tenderness and no frontal sinus tenderness.  Mouth/Throat: Uvula is midline and mucous membranes are normal. Posterior oropharyngeal erythema present. No oropharyngeal exudate or posterior oropharyngeal edema.  Eyes: Pupils are equal, round, and reactive to light. Conjunctivae are normal. Right eye exhibits no discharge. Left eye exhibits no discharge. No scleral icterus.  Neck: Normal range of motion and full passive range of motion without pain. Neck supple. Muscular tenderness (  right upper trapezius) present. No spinous process tenderness present. No tracheal deviation, no edema and no erythema present. No thyromegaly present.  Cardiovascular: Normal rate, regular rhythm and normal heart sounds. Exam reveals no gallop and no friction rub.  No murmur heard. Pulmonary/Chest: Effort normal and breath sounds normal. No stridor. No respiratory distress. She has no wheezes. She has no rales.  Lymphadenopathy:    She has no cervical adenopathy.  Skin:  Skin is warm and dry. She is not diaphoretic.  Vitals reviewed.       Assessment & Plan:     1. Non-recurrent acute suppurative otitis media of right ear without spontaneous rupture of tympanic membrane Worsening symptoms. Will give amoxil as below. Continue allergy medications. Stay well hydrated and get plenty of rest. Call if no symptom improvement or if symptoms worsen. - amoxicillin (AMOXIL) 875 MG tablet; Take 1 tablet (875 mg total) by mouth 2 (two) times daily.  Dispense: 20 tablet; Refill: 0  2. Strain of neck muscle, initial encounter Secondary to compensating for right shoulder. Not responding to meloxicam. Will give Medrol as below. Advised to apply heat and exercises/stretches printed for patient. Does have known cervical radiculopathy and h/o complete rotator cuff tear. Call if worsening.  - methylPREDNISolone (MEDROL) 4 MG tablet; Take 1 tablet (4 mg total) by mouth daily.  Dispense: 7 tablet; Refill: 0       Mar Daring, PA-C  [facility]

## 2018-07-19 NOTE — Patient Instructions (Signed)
Cervical Strain and Sprain Rehab Ask your health care provider which exercises are safe for you. Do exercises exactly as told by your health care provider and adjust them as directed. It is normal to feel mild stretching, pulling, tightness, or discomfort as you do these exercises, but you should stop right away if you feel sudden pain or your pain gets worse.Do not begin these exercises until told by your health care provider. Stretching and range of motion exercises These exercises warm up your muscles and joints and improve the movement and flexibility of your neck. These exercises also help to relieve pain, numbness, and tingling. Exercise A: Cervical side bend  1. Using good posture, sit on a stable chair or stand up. 2. Without moving your shoulders, slowly tilt your left / right ear to your shoulder until you feel a stretch in your neck muscles. You should be looking straight ahead. 3. Hold for __________ seconds. 4. Repeat with the other side of your neck. Repeat __________ times. Complete this exercise __________ times a day. Exercise B: Cervical rotation  1. Using good posture, sit on a stable chair or stand up. 2. Slowly turn your head to the side as if you are looking over your left / right shoulder. ? Keep your eyes level with the ground. ? Stop when you feel a stretch along the side and the back of your neck. 3. Hold for __________ seconds. 4. Repeat this by turning to your other side. Repeat __________ times. Complete this exercise __________ times a day. Exercise C: Thoracic extension and pectoral stretch 1. Roll a towel or a small blanket so it is about 4 inches (10 cm) in diameter. 2. Lie down on your back on a firm surface. 3. Put the towel lengthwise, under your spine in the middle of your back. It should not be not under your shoulder blades. The towel should line up with your spine from your middle back to your lower back. 4. Put your hands behind your head and let your  elbows fall out to your sides. 5. Hold for __________ seconds. Repeat __________ times. Complete this exercise __________ times a day. Strengthening exercises These exercises build strength and endurance in your neck. Endurance is the ability to use your muscles for a long time, even after your muscles get tired. Exercise D: Upper cervical flexion, isometric 1. Lie on your back with a thin pillow behind your head and a small rolled-up towel under your neck. 2. Gently tuck your chin toward your chest and nod your head down to look toward your feet. Do not lift your head off the pillow. 3. Hold for __________ seconds. 4. Release the tension slowly. Relax your neck muscles completely before you repeat this exercise. Repeat __________ times. Complete this exercise __________ times a day. Exercise E: Cervical extension, isometric  1. Stand about 6 inches (15 cm) away from a wall, with your back facing the wall. 2. Place a soft object, about 6-8 inches (15-20 cm) in diameter, between the back of your head and the wall. A soft object could be a small pillow, a ball, or a folded towel. 3. Gently tilt your head back and press into the soft object. Keep your jaw and forehead relaxed. 4. Hold for __________ seconds. 5. Release the tension slowly. Relax your neck muscles completely before you repeat this exercise. Repeat __________ times. Complete this exercise __________ times a day. Posture and body mechanics  Body mechanics refers to the movements and positions of   your body while you do your daily activities. Posture is part of body mechanics. Good posture and healthy body mechanics can help to relieve stress in your body's tissues and joints. Good posture means that your spine is in its natural S-curve position (your spine is neutral), your shoulders are pulled back slightly, and your head is not tipped forward. The following are general guidelines for applying improved posture and body mechanics to  your everyday activities. Standing  When standing, keep your spine neutral and keep your feet about hip-width apart. Keep a slight bend in your knees. Your ears, shoulders, and hips should line up.  When you do a task in which you stand in one place for a long time, place one foot up on a stable object that is 2-4 inches (5-10 cm) high, such as a footstool. This helps keep your spine neutral. Sitting   When sitting, keep your spine neutral and your keep feet flat on the floor. Use a footrest, if necessary, and keep your thighs parallel to the floor. Avoid rounding your shoulders, and avoid tilting your head forward.  When working at a desk or a computer, keep your desk at a height where your hands are slightly lower than your elbows. Slide your chair under your desk so you are close enough to maintain good posture.  When working at a computer, place your monitor at a height where you are looking straight ahead and you do not have to tilt your head forward or downward to look at the screen. Resting When lying down and resting, avoid positions that are most painful for you. Try to support your neck in a neutral position. You can use a contour pillow or a small rolled-up towel. Your pillow should support your neck but not push on it. This information is not intended to replace advice given to you by your health care provider. Make sure you discuss any questions you have with your health care provider. Document Released: 08/30/2005 Document Revised: 05/06/2016 Document Reviewed: 08/06/2015 Elsevier Interactive Patient Education  2018 [employer]  

## 2018-08-30 ENCOUNTER — Other Ambulatory Visit: Payer: Self-pay | Admitting: Physician Assistant

## 2018-08-30 DIAGNOSIS — I10 Essential (primary) hypertension: Secondary | ICD-10-CM

## 2018-09-19 ENCOUNTER — Ambulatory Visit: Payer: Self-pay | Admitting: Physician Assistant

## 2018-09-19 ENCOUNTER — Telehealth: Payer: Self-pay | Admitting: *Deleted

## 2018-09-19 DIAGNOSIS — Z111 Encounter for screening for respiratory tuberculosis: Secondary | ICD-10-CM

## 2018-09-19 NOTE — Telephone Encounter (Signed)
Lab slip printed. 

## 2018-09-19 NOTE — Telephone Encounter (Signed)
Patient needing lab slip for TB lab. Please advise?

## 2018-09-20 DIAGNOSIS — Z111 Encounter for screening for respiratory tuberculosis: Secondary | ICD-10-CM | POA: Diagnosis not present

## 2018-09-22 ENCOUNTER — Encounter: Payer: Self-pay | Admitting: Family Medicine

## 2018-09-22 ENCOUNTER — Telehealth: Payer: Self-pay | Admitting: Physician Assistant

## 2018-09-22 NOTE — Telephone Encounter (Signed)
Pt needing a form stating she had her TB Test done w/ results.  Please advise.  Thanks, [employer]

## 2018-09-23 LAB — QUANTIFERON-TB GOLD PLUS
QUANTIFERON NIL VALUE: 0.09 [IU]/mL
QUANTIFERON TB1 AG VALUE: 0.08 [IU]/mL
QUANTIFERON TB2 AG VALUE: 0.08 [IU]/mL
QUANTIFERON-TB GOLD PLUS: NEGATIVE

## 2018-09-25 ENCOUNTER — Telehealth: Payer: Self-pay

## 2018-09-25 NOTE — Telephone Encounter (Signed)
Patient advised.KW 

## 2018-09-25 NOTE — Telephone Encounter (Signed)
-----   Message from Mar Daring, PA-C sent at 09/25/2018  7:48 AM EST ----- Quantiferon gold testing is negative for TB. Please notify patient and print for her to pick up for employer.

## 2018-12-20 ENCOUNTER — Other Ambulatory Visit: Payer: Self-pay | Admitting: Physician Assistant

## 2018-12-20 DIAGNOSIS — M5412 Radiculopathy, cervical region: Secondary | ICD-10-CM

## 2018-12-27 ENCOUNTER — Telehealth: Payer: Self-pay

## 2018-12-27 DIAGNOSIS — H66001 Acute suppurative otitis media without spontaneous rupture of ear drum, right ear: Secondary | ICD-10-CM

## 2018-12-27 MED ORDER — AMOXICILLIN 875 MG PO TABS: 875.0000 mg | ORAL_TABLET | Freq: Two times a day (BID) | ORAL | 0 refills | Status: DC

## 2018-12-27 NOTE — Telephone Encounter (Signed)
Patient calling with c/o sore throat for 2 days and is asking if you can send medicine for it.

## 2018-12-27 NOTE — Telephone Encounter (Signed)
Patient advised as directed below. Per patient she already tried the salt water gargles and the chloraseptic spray and other things. 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 She is asking for an antibiotic and she uses [facility] [employer] [facility].

## 2018-12-27 NOTE — Telephone Encounter (Signed)
If she does not get better she HAS to be seen. Amoxil sent in to [facility] [place]

## 2018-12-27 NOTE — Telephone Encounter (Signed)
Does she have fever or sinus congestion, post nasal drainage? Travel? Covid exposure? May require strep testing if it is only the throat hurting. Also she can use salt water gargles for inflammation, chloraseptic spray to numb throat if it is that painful and throat lozenges to keep mouth and throat moist. Push fluids. Tylenol prn for pain/fevers.

## 2019-01-03 ENCOUNTER — Ambulatory Visit (INDEPENDENT_AMBULATORY_CARE_PROVIDER_SITE_OTHER): Payer: BC Managed Care – PPO | Admitting: Physician Assistant

## 2019-01-03 ENCOUNTER — Encounter: Payer: Self-pay | Admitting: Physician Assistant

## 2019-01-03 ENCOUNTER — Ambulatory Visit: Payer: Self-pay | Admitting: Physician Assistant

## 2019-01-03 VITALS — Temp 97.5°F

## 2019-01-03 DIAGNOSIS — E119 Type 2 diabetes mellitus without complications: Secondary | ICD-10-CM | POA: Diagnosis not present

## 2019-01-03 DIAGNOSIS — D582 Other hemoglobinopathies: Secondary | ICD-10-CM

## 2019-01-03 LAB — POCT GLYCOSYLATED HEMOGLOBIN (HGB A1C)
EOS (ABSOLUTE): 160
Hemoglobin A1C: 7.2 % — AB (ref 4.0–5.6)

## 2019-01-03 NOTE — Progress Notes (Signed)
Virtual Visit via Video Note  I connected with Molly Potter on 01/03/19 at  3:00 PM EDT by a video enabled telemedicine application and verified that I am speaking with the correct person using two identifiers.   I discussed the limitations of evaluation and management by telemedicine and the availability of in person appointments. The patient expressed understanding and agreed to proceed.  Patient location: home Provider location: [facility] Persons involved in the visit: patient, provider  Mar Daring, PA-C   Patient: Molly Potter Female    DOB: 01/12/1956   63 y.o.   MRN: [id number] Visit Date: 01/03/2019  Today's Provider: Mar Daring, PA-C   Chief Complaint  Patient presents with  . Follow-up  . Diabetes   Subjective:     HPI   Newly diagnosed diabetes ([place]) From 07/03/2018-Urine microalbumin normal. A1c 7.0 new. Had a long discussion with patient about diabetes diagnosis, nutrition, exercise, and progression. Offered the diabetic education course but patient declines at this time. Also declines starting a statin and pneumonia vaccination at this time. She is going to try lifestyle modifications with dieting and exercise. She will return in 6 months for recheck.   E-visit with patient today following A1c collection earlier this afternoon. She reports she has been trying to do better with her dieting habits, but has not succeeded like she thought she would. She reports bread is her vice. She denies any visual changes, urinary symptoms, numbness/tingling.  Allergies  Allergen Reactions  . Effexor [Venlafaxine] Anaphylaxis  . Diazepam     Other reaction(s): Unknown  . Synvisc [Hylan G-F 20] Other (See Comments)    Myalgias, hand pain, back pain; Benadryl helped pain  . Tramadol Nausea Only     Current Outpatient Medications:  .  acetaminophen (TYLENOL) 500 MG tablet, Take 1,000 mg by mouth every 6 (six) hours as needed., Disp: ,  Rfl:  .  amoxicillin (AMOXIL) 875 MG tablet, Take 1 tablet (875 mg total) by mouth 2 (two) times daily., Disp: 20 tablet, Rfl: 0 .  cyclobenzaprine (FLEXERIL) 5 MG tablet, TAKE 1 TABLET(5 MG) BY MOUTH AT BEDTIME, Disp: 90 tablet, Rfl: 1 .  DILT-XR 240 MG 24 hr capsule, TAKE 1 CAPSULE(240 MG) BY MOUTH DAILY, Disp: 90 capsule, Rfl: 1 .  meloxicam (MOBIC) 15 MG tablet, , Disp: , Rfl: 1 .  methylPREDNISolone (MEDROL) 4 MG tablet, Take 1 tablet (4 mg total) by mouth daily. (Patient not taking: Reported on 01/03/2019), Disp: 7 tablet, Rfl: 0  Review of Systems  Constitutional: Negative for appetite change, chills, fatigue and fever.  Respiratory: Negative for chest tightness and shortness of breath.   Cardiovascular: Negative for chest pain and palpitations.  Gastrointestinal: Negative for abdominal pain, nausea and vomiting.  Neurological: Negative for dizziness and weakness.    Social History   Tobacco Use  . Smoking status: Never Smoker  . Smokeless tobacco: Never Used  Substance Use Topics  . Alcohol use: Yes    Comment: Occasional      Objective:   Temp (!) 97.5 F (36.4 C) (Oral)  Vitals:   01/03/19 1437  Temp: (!) 97.5 F (36.4 C)  TempSrc: Oral     Physical Exam Vitals signs reviewed.  Constitutional:      General: She is not in acute distress.    Appearance: Normal appearance. She is well-developed. She is obese. She is not ill-appearing.  HENT:     Head: Normocephalic and atraumatic.  Neck:     Musculoskeletal: Normal range of motion and neck supple.  Pulmonary:     Effort: Pulmonary effort is normal. No respiratory distress.  Neurological:     Mental Status: She is alert.  Psychiatric:        Mood and Affect: Mood normal.        Behavior: Behavior normal.        Thought Content: Thought content normal.        Judgment: Judgment normal.         Assessment & Plan    1. Elevated hemoglobin (HCC) A1c increased to 7.2 from 7.0.  - POCT glycosylated  hemoglobin (Hb A1C)  2. Type 2 diabetes mellitus without complication, without long-term current use of insulin ([place]) Patient still declines medications at this time. She is agreeable to diabetic education. Discussed nutrition aspect again for diabetics and how to read food labels. She voiced understanding. Referral placed for diabetic education. Advised for diabetic eye exam. She will call to schedule. I will see her back in 6 months for recheck. Call if worsening symptoms in the meantime. - Ambulatory referral to diabetic education    I discussed the assessment and treatment plan with the patient. The patient was provided an opportunity to ask questions and all were answered. The patient agreed with the plan and demonstrated an understanding of the instructions.   The patient was advised to call back or seek an in-person evaluation if the symptoms worsen or if the condition fails to improve as anticipated.  I provided 25 minutes of non-face-to-face time during this encounter.  Mar Daring, PA-C  [facility]

## 2019-01-03 NOTE — Patient Instructions (Signed)
Diabetes Mellitus and Nutrition, Adult  When you have diabetes (diabetes mellitus), it is very important to have healthy eating habits because your blood sugar (glucose) levels are greatly affected by what you eat and drink. Eating healthy foods in the appropriate amounts, at about the same times every day, can help you:  · Control your blood glucose.  · Lower your risk of heart disease.  · Improve your blood pressure.  · Reach or maintain a healthy weight.  Every person with diabetes is different, and each person has different needs for a meal plan. Your health care provider may recommend that you work with a diet and nutrition specialist (dietitian) to make a meal plan that is best for you. Your meal plan may vary depending on factors such as:  · The calories you need.  · The medicines you take.  · Your weight.  · Your blood glucose, blood pressure, and cholesterol levels.  · Your activity level.  · Other health conditions you have, such as heart or kidney disease.  How do carbohydrates affect me?  Carbohydrates, also called carbs, affect your blood glucose level more than any other type of food. Eating carbs naturally raises the amount of glucose in your blood. Carb counting is a method for keeping track of how many carbs you eat. Counting carbs is important to keep your blood glucose at a healthy level, especially if you use insulin or take certain oral diabetes medicines.  It is important to know how many carbs you can safely have in each meal. This is different for every person. Your dietitian can help you calculate how many carbs you should have at each meal and for each snack.  Foods that contain carbs include:  · Bread, cereal, rice, pasta, and crackers.  · Potatoes and corn.  · Peas, beans, and lentils.  · Milk and yogurt.  · Fruit and juice.  · Desserts, such as cakes, cookies, ice cream, and candy.  How does alcohol affect me?  Alcohol can cause a sudden decrease in blood glucose (hypoglycemia),  especially if you use insulin or take certain oral diabetes medicines. Hypoglycemia can be a life-threatening condition. Symptoms of hypoglycemia (sleepiness, dizziness, and confusion) are similar to symptoms of having too much alcohol.  If your health care provider says that alcohol is safe for you, follow these guidelines:  · Limit alcohol intake to no more than 1 drink per day for nonpregnant women and 2 drinks per day for men. One drink equals 12 oz of beer, 5 oz of wine, or 1½ oz of hard liquor.  · Do not drink on an empty stomach.  · Keep yourself hydrated with water, diet soda, or unsweetened iced tea.  · Keep in mind that regular soda, juice, and other mixers may contain a lot of sugar and must be counted as carbs.  What are tips for following this plan?    Reading food labels  · Start by checking the serving size on the "Nutrition Facts" label of packaged foods and drinks. The amount of calories, carbs, fats, and other nutrients listed on the label is based on one serving of the item. Many items contain more than one serving per package.  · Check the total grams (g) of carbs in one serving. You can calculate the number of servings of carbs in one serving by dividing the total carbs by 15. For example, if a food has 30 g of total carbs, it would be equal to 2   servings of carbs.  · Check the number of grams (g) of saturated and trans fats in one serving. Choose foods that have low or no amount of these fats.  · Check the number of milligrams (mg) of salt (sodium) in one serving. Most people should limit total sodium intake to less than 2,300 mg per day.  · Always check the nutrition information of foods labeled as "low-fat" or "nonfat". These foods may be higher in added sugar or refined carbs and should be avoided.  · Talk to your dietitian to identify your daily goals for nutrients listed on the label.  Shopping  · Avoid buying canned, premade, or processed foods. These foods tend to be high in fat, sodium,  and added sugar.  · Shop around the outside edge of the grocery store. This includes fresh fruits and vegetables, bulk grains, fresh meats, and fresh dairy.  Cooking  · Use low-heat cooking methods, such as baking, instead of high-heat cooking methods like deep frying.  · Cook using healthy oils, such as olive, canola, or sunflower oil.  · Avoid cooking with butter, cream, or high-fat meats.  Meal planning  · Eat meals and snacks regularly, preferably at the same times every day. Avoid going long periods of time without eating.  · Eat foods high in fiber, such as fresh fruits, vegetables, beans, and whole grains. Talk to your dietitian about how many servings of carbs you can eat at each meal.  · Eat 4-6 ounces (oz) of lean protein each day, such as lean meat, chicken, fish, eggs, or tofu. One oz of lean protein is equal to:  ? 1 oz of meat, chicken, or fish.  ? 1 egg.  ? ¼ cup of tofu.  · Eat some foods each day that contain healthy fats, such as avocado, nuts, seeds, and fish.  Lifestyle  · Check your blood glucose regularly.  · Exercise regularly as told by your health care provider. This may include:  ? 150 minutes of moderate-intensity or vigorous-intensity exercise each week. This could be brisk walking, biking, or water aerobics.  ? Stretching and doing strength exercises, such as yoga or weightlifting, at least 2 times a week.  · Take medicines as told by your health care provider.  · Do not use any products that contain nicotine or tobacco, such as cigarettes and e-cigarettes. If you need help quitting, ask your health care provider.  · Work with a counselor or diabetes educator to identify strategies to manage stress and any emotional and social challenges.  Questions to ask a health care provider  · Do I need to meet with a diabetes educator?  · Do I need to meet with a dietitian?  · What number can I call if I have questions?  · When are the best times to check my blood glucose?  Where to find more  information:  · American Diabetes Association: diabetes.org  · Academy of Nutrition and Dietetics: www.eatright.org  · National Institute of Diabetes and Digestive and Kidney Diseases (NIH): www.niddk.nih.gov  Summary  · A healthy meal plan will help you control your blood glucose and maintain a healthy lifestyle.  · Working with a diet and nutrition specialist (dietitian) can help you make a meal plan that is best for you.  · Keep in mind that carbohydrates (carbs) and alcohol have immediate effects on your blood glucose levels. It is important to count carbs and to use alcohol carefully.  This information is not intended to   replace advice given to you by your health care provider. Make sure you discuss any questions you have with your health care provider.  Document Released: 05/27/2005 Document Revised: 03/30/2017 Document Reviewed: 10/04/2016  Elsevier Interactive Patient Education © 2019 [employer]

## 2019-01-15 ENCOUNTER — Encounter: Payer: Self-pay | Admitting: Dietician

## 2019-01-15 ENCOUNTER — Other Ambulatory Visit: Payer: Self-pay

## 2019-01-15 ENCOUNTER — Encounter: Payer: BLUE CROSS/BLUE SHIELD | Attending: Physician Assistant | Admitting: Dietician

## 2019-01-15 VITALS — BP 160/92 | Ht 65.0 in | Wt 197.3 lb

## 2019-01-15 DIAGNOSIS — Z6832 Body mass index (BMI) 32.0-32.9, adult: Secondary | ICD-10-CM | POA: Insufficient documentation

## 2019-01-15 DIAGNOSIS — E119 Type 2 diabetes mellitus without complications: Secondary | ICD-10-CM | POA: Diagnosis not present

## 2019-01-15 DIAGNOSIS — Z713 Dietary counseling and surveillance: Secondary | ICD-10-CM | POA: Insufficient documentation

## 2019-01-15 NOTE — Progress Notes (Signed)
Diabetes Self-Management Education  Visit Type: First/Initial  Appt. Start Time:1425  Appt. End Time: 1540  01/15/2019  [name], identified by name and date of birth, is a 63 y.o. female with a diagnosis of Diabetes: Type 2.   ASSESSMENT  Blood pressure (!) 160/92, height 5\' 5"  (1.651 m), weight 197 lb 4.8 oz (89.5 kg). Body mass index is 32.83 kg/m.  Diabetes Self-Management Education - 01/15/19 1920      Visit Information   Visit Type  First/Initial      Initial Visit   Diabetes Type  Type 2      Health Coping   How would you rate your overall health?  Good      Psychosocial Assessment   Patient Belief/Attitude about Diabetes  Motivated to manage diabetes    Self-care barriers  None    Self-management support  Doctor's office    Other persons present  Patient    Patient Concerns  Healthy Lifestyle;Glycemic Control;Weight Control   become more fit, prevent complications   Special Needs  None    Preferred Learning Style  Hands on;Visual;Auditory;No preference indicated    Learning Readiness  Ready    What is the last grade level you completed in school?  GED      Pre-Education Assessment   Patient understands the diabetes disease and treatment process.  Needs Instruction    Patient understands incorporating nutritional management into lifestyle.  Needs Instruction    Patient undertands incorporating physical activity into lifestyle.  Needs Instruction    Patient understands using medications safely.  Needs Instruction    Patient understands monitoring blood glucose, interpreting and using results  Needs Instruction    Patient understands prevention, detection, and treatment of acute complications.  Needs Instruction    Patient understands prevention, detection, and treatment of chronic complications.  Needs Instruction    Patient understands how to develop strategies to address psychosocial issues.  Needs Instruction    Patient understands how to develop  strategies to promote health/change behavior.  Needs Instruction      Complications   Last HgB A1C per patient/outside source  7.2 %   01-03-19   How often do you check your blood sugar?  0 times/day (not testing)    Have you had a dilated eye exam in the past 12 months?  Yes   06-2018   Have you had a dental exam in the past 12 months?  Yes   06-2018   Are you checking your feet?  Yes    How many days per week are you checking your feet?  7      Dietary Intake   Breakfast  eats breakfast at 7a=usually crackers and peanut butter + coffee with creamer    Snack (morning)  none    Lunch  lunch at 12:30-1p=hamburger or chicken sandwich or chicken nuggets (eats fast foods 5x/wk)   Snack (afternoon)  eats trail mix, small moon pie, sweets or snack foods at 3p    Dinner  eats meat and vegetables at 5p-6p    Snack (evening)  eats sugar free ice cream or crackers and peanut butter at 9p    Beverage(s)  drinks water 6-7x/day, diet juice drinks 4x/day, regular soda/sweet tea 2-3x/day and unsweetened coffee 1x/day      Exercise   Exercise Type  ADL's   limited due to joint pain from arthritis     Patient Education   Previous Diabetes Education  No    Disease  state   Explored patient's options for treatment of their diabetes;Definition of diabetes, type 1 and 2, and the diagnosis of diabetes    Nutrition management   Role of diet in the treatment of diabetes and the relationship between the three main macronutrients and blood glucose level;Food label reading, portion sizes and measuring food.;Carbohydrate counting    Physical activity and exercise   Role of exercise on diabetes management, blood pressure control and cardiac health.;Helped patient identify appropriate exercises in relation to his/her diabetes, diabetes complications and other health issue.    Monitoring  Taught/evaluated SMBG meter.;Purpose and frequency of SMBG.;Taught/discussed recording of test results and interpretation of  SMBG.;Identified appropriate SMBG and/or A1C goals. Gave pt Contour Next meter and instructed on its use-BG 154 (2 hr pp)   Chronic complications  Relationship between chronic complications and blood glucose control    Personal strategies to promote health  Lifestyle issues that need to be addressed for better diabetes care;Helped patient develop diabetes management plan for (enter comment)      Outcomes   Expected Outcomes  Demonstrated interest in learning. Expect positive outcomes       Individualized Plan for Diabetes Self-Management Training:   Learning Objective:  Patient will have a greater understanding of diabetes self-management. Patient education plan is to attend individual and/or group sessions per assessed needs and concerns.   Plan:   Patient Instructions   Check blood sugars 2 x day before breakfast and 2 hrs after supper every day  Bring blood sugar records to the next appointment/class  Call your doctor for a prescription for:  1. Meter strips (type) Contour Next test strips checking  2x/day  2. Lancets (type) Microlet lancets checking 2x/day    Exercise: Try exercises in Workout To Go handout 15-20 min days a week  Eat 3 meals day and 1 snack a day at bedtime  Space meals 4-6 hours apart  Eat 2-3 carbohydrate servings/meal + protein  Eat 1 carbohydrate serving/snack + protein  Avoid sugar sweetened drinks (soda, tea, coffee, sports drinks, juices)  Drink plenty of water  Limit intake of sweets, snack foods and fried foods  Get a Sharps container  Return for appointment/classes on:  01-22-19   Expected Outcomes:  Demonstrated interest in learning. Expect positive outcomes  Education material provided: General meal planning guidelines,  Food Group handout, Contour Next meter  If problems or questions, patient to contact team via:  [phone]  Future DSME appointment:  01-29-19

## 2019-01-15 NOTE — Patient Instructions (Signed)
  Check blood sugars 2 x day before breakfast and 2 hrs after supper every day  Bring blood sugar records to the next appointment/class  Call your doctor for a prescription for:  1. Meter strips (type) Contour Next test strips checking  2x/day  2. Lancets (type) Microlet lancets checking 2x/day    Exercise: Try exercises in Workout To Go handout 15-20 min days a week  Eat 3 meals day and 1 snack a day at bedtime  Space meals 4-6 hours apart  Eat 2-3 carbohydrate servings/meal + protein  Eat 1 carbohydrate serving/snack + protein  Avoid sugar sweetened drinks (soda, tea, coffee, sports drinks, juices)  Drink plenty of water  Limit intake of sweets, snack foods and fried foods  Get a Sharps container  Return for appointment/classes on:  01-22-19

## 2019-01-16 ENCOUNTER — Telehealth: Payer: Self-pay

## 2019-01-16 DIAGNOSIS — N183 Chronic kidney disease, stage 3 unspecified: Secondary | ICD-10-CM

## 2019-01-16 DIAGNOSIS — E1122 Type 2 diabetes mellitus with diabetic chronic kidney disease: Secondary | ICD-10-CM

## 2019-01-16 MED ORDER — GLUCOSE BLOOD VI STRP: ORAL_STRIP | 3 refills | Status: DC

## 2019-01-16 MED ORDER — MICROLET LANCETS MISC: 3 refills | Status: DC

## 2019-01-16 NOTE — Telephone Encounter (Signed)
Patient calling that she received a bill and it states that she is being bill because of no insurance coverage. Patient reports that she does have insurance but has not been able to bring it in. Patient was advised to upload a picture of her insurance card into [facility]. Activation code sent to her to activate my chart so her insurance can be process.   She also reports that she went to her Dietician yetserday and they want her to check her sugar levels twice a day and is needing prescription to be send in for her for Contour Next Test strips and Microl lancets.  Pharmacy: [facility] [address]

## 2019-01-16 NOTE — Telephone Encounter (Signed)
Sent in strips and lancets

## 2019-01-22 ENCOUNTER — Encounter: Payer: Self-pay | Admitting: Dietician

## 2019-01-22 ENCOUNTER — Other Ambulatory Visit: Payer: Self-pay

## 2019-01-22 ENCOUNTER — Encounter: Payer: BLUE CROSS/BLUE SHIELD | Admitting: Dietician

## 2019-01-22 VITALS — Ht 65.0 in | Wt 197.9 lb

## 2019-01-22 DIAGNOSIS — Z6832 Body mass index (BMI) 32.0-32.9, adult: Secondary | ICD-10-CM | POA: Diagnosis not present

## 2019-01-22 DIAGNOSIS — Z713 Dietary counseling and surveillance: Secondary | ICD-10-CM | POA: Diagnosis not present

## 2019-01-22 DIAGNOSIS — E119 Type 2 diabetes mellitus without complications: Secondary | ICD-10-CM | POA: Diagnosis not present

## 2019-01-22 NOTE — Progress Notes (Signed)
Appt. Start Time: 1730 Appt. End Time: 2010  Class 1 Diabetes Overview - define DM; state own type of DM; identify functions of pancreas and insulin; define insulin deficiency vs insulin resistance  Psychosocial - identify DM as a source of stress; state the effects of stress on BG control  Nutritional Management - describe effects of food on blood glucose; identify sources of carbohydrate, protein and fat; verbalize the importance of balance meals in controlling blood glucose  Exercise - describe the effects of exercise on blood glucose and importance of regular exercise in controlling diabetes; state a plan for personal exercise; verbalize contraindications for exercise  Self-Monitoring - state importance of SMBG; use SMBG results to effectively manage diabetes; identify importance of regular HbA1C testing and goals for results  Acute Complications - recognize hyperglycemia and hypoglycemia with causes and effects; identify blood glucose results as high, low or in control; list steps in treating and preventing high and low blood glucose  Chronic Complications/Foot, Skin, Eye Dental Care - identify possible long-term complications of diabetes (retinopathy, neuropathy, nephropathy, cardiovascular disease, infections); state importance of daily self-foot exams; describe how to examine feet and what to look for; explain appropriate eye and dental care  Lifestyle Changes/Goals & Health/Community Resources - state benefits of making appropriate lifestyle changes; identify habits that need to change (meals, tobacco, alcohol); identify strategies to reduce risk factors for personal health  Pregnancy/Sexual Health - define gestational diabetes; state importance of good blood glucose control and birth control prior to pregnancy; state importance of good blood glucose control in preventing sexual problems (impotence, vaginal dryness, infections, loss of desire); state relationship of blood glucose control  and pregnancy outcome; describe risk of maternal and fetal complications  Teaching Materials Used: Class 1 Slides/Notebook Diabetes Booklet ID Card  Medic Alert/Medic ID Forms Sleep Evaluation Exercise Handout Planning a Balanced Meal Goals for Class 1

## 2019-01-23 ENCOUNTER — Other Ambulatory Visit: Payer: Self-pay

## 2019-01-29 ENCOUNTER — Encounter: Payer: Self-pay | Admitting: Dietician

## 2019-01-29 ENCOUNTER — Encounter: Payer: BLUE CROSS/BLUE SHIELD | Admitting: Dietician

## 2019-01-29 ENCOUNTER — Other Ambulatory Visit: Payer: Self-pay

## 2019-01-29 VITALS — Wt 196.0 lb

## 2019-01-29 DIAGNOSIS — E119 Type 2 diabetes mellitus without complications: Secondary | ICD-10-CM | POA: Diagnosis not present

## 2019-01-29 DIAGNOSIS — Z713 Dietary counseling and surveillance: Secondary | ICD-10-CM | POA: Diagnosis not present

## 2019-01-29 DIAGNOSIS — Z6832 Body mass index (BMI) 32.0-32.9, adult: Secondary | ICD-10-CM | POA: Diagnosis not present

## 2019-01-29 NOTE — Progress Notes (Signed)

## 2019-02-02 ENCOUNTER — Other Ambulatory Visit: Payer: Self-pay

## 2019-02-12 ENCOUNTER — Other Ambulatory Visit: Payer: Self-pay

## 2019-02-12 ENCOUNTER — Encounter: Payer: BLUE CROSS/BLUE SHIELD | Attending: Physician Assistant | Admitting: Dietician

## 2019-02-12 ENCOUNTER — Encounter: Payer: Self-pay | Admitting: Dietician

## 2019-02-12 VITALS — BP 150/90 | Ht 65.0 in | Wt 193.5 lb

## 2019-02-12 DIAGNOSIS — Z713 Dietary counseling and surveillance: Secondary | ICD-10-CM | POA: Insufficient documentation

## 2019-02-12 DIAGNOSIS — E119 Type 2 diabetes mellitus without complications: Secondary | ICD-10-CM | POA: Diagnosis not present

## 2019-02-12 DIAGNOSIS — Z6832 Body mass index (BMI) 32.0-32.9, adult: Secondary | ICD-10-CM | POA: Diagnosis not present

## 2019-02-12 NOTE — Progress Notes (Signed)
Appt. Start Time: 1600 Appt. End Time: 1900  Class 3 Diabetes Overview - identify functions of pancreas and insulin; define insulin deficiency vs insulin resistance  Medications - state name, dose, timing of currently prescribed medications; describe types of medications available for diabetes  Psychosocial - identify DM as a source of stress; state the effects of stress on BG control; verbalize appropriate stress management techniques; identify personal stress issues   Nutritional Management - use food labels to identify serving size, content of carbohydrate, fiber, protein, fat, saturated fat and sodium; recognize food sources of fat, saturated fat, trans fat, and sodium, and verbalize goals for intake; describe healthful, appropriate food choices when dining out   Exercise - state a plan for personal exercise; verbalize contraindications for exercise  Self-Monitoring - state importance of SMBG; use SMBG results to effectively manage diabetes; identify importance of regular HbA1C testing and goals for results  Acute Complications - recognize hyperglycemia and hypoglycemia with causes and effects; identify blood glucose results as high, low or in control; list steps in treating and preventing high and low blood glucose  Chronic Complications - state importance of daily self-foot exams; explain appropriate eye and dental care  Lifestyle Changes/Goals & Health/Community Resources - set goals for proper diabetes care; state need for and frequency of healthcare follow-up; describe appropriate community resources for good health (ADA, web sites, apps)   Teaching Materials Used: Class 3 Slide Packet Diabetes Stress Test Stress Management Tools Stress Poem Goal Setting Worksheet Website/App List

## 2019-02-19 ENCOUNTER — Encounter: Payer: Self-pay | Admitting: Dietician

## 2019-03-21 ENCOUNTER — Other Ambulatory Visit: Payer: Self-pay | Admitting: Physician Assistant

## 2019-03-21 DIAGNOSIS — I10 Essential (primary) hypertension: Secondary | ICD-10-CM

## 2019-06-15 ENCOUNTER — Other Ambulatory Visit: Payer: Self-pay | Admitting: Physician Assistant

## 2019-06-15 DIAGNOSIS — M5412 Radiculopathy, cervical region: Secondary | ICD-10-CM

## 2019-06-28 NOTE — Progress Notes (Signed)
Patient: Molly Potter, Female    DOB: Oct 07, 1955, 62 y.o.   MRN: [id number] Visit Date: 06/29/2019  Today's Provider: Mar Daring, PA-C   Chief Complaint  Patient presents with   Annual Exam   Subjective:     Annual physical exam Molly Potter is a 63 y.o. female who presents today for health maintenance and complete physical. She feels well. She reports exercising. She reports she is sleeping well. ----------------------------------------------------------------- Last eye exam: 05/2019 at [facility]  Constitutional: Positive for activity change.  HENT: Negative.   Eyes: Negative.   Respiratory: Negative.   Cardiovascular: Negative.   Gastrointestinal: Negative.   Endocrine: Positive for polydipsia.  Genitourinary: Negative.   Musculoskeletal: Positive for arthralgias, back pain and neck pain.  Skin: Negative.   Allergic/Immunologic: Negative.   Neurological: Negative.   Hematological: Negative.   Psychiatric/Behavioral: Negative.     Social History      She  reports that she has never smoked. She has never used smokeless tobacco. She reports previous alcohol use. She reports that she does not use drugs.       Social History   Socioeconomic History   Marital status: Married    Spouse name: Not on file   Number of children: 4   Years of education: GED   Highest education level: Not on file  Occupational History    Employer: [employer]  Social Needs   Financial resource strain: Not on file   Food insecurity    Worry: Not on file    Inability: Not on file   Transportation needs    Medical: Not on file    Non-medical: Not on file  Tobacco Use   Smoking status: Never Smoker   Smokeless tobacco: Never Used  Substance and Sexual Activity   Alcohol use: Not Currently    Comment: Occasional   Drug use: No   Sexual activity: Not on file  Lifestyle   Physical activity    Days per week: Not on file   Minutes per session: Not on file   Stress: Not on file  Relationships   Social connections    Talks on phone: Not on file    Gets together: Not on file    Attends religious service: Not on file    Active member of club or organization: Not on file    Attends meetings of clubs or organizations: Not on file    Relationship status: Not on file  Other Topics Concern   Not on file  Social History Narrative   Not on file    Past Medical History:  Diagnosis Date   Arthritis    Fatigue    History of eczema    Hyperthyroidism    Joint pain      Patient Active Problem List   Diagnosis Date Noted   Type 2 diabetes mellitus without complication, without long-term current use of insulin ([place]) 01/03/2019   Newly diagnosed diabetes ([place]) 07/03/2018   Moderate mixed hyperlipidemia not requiring statin therapy 06/20/2018   BMI 34.0-34.9,adult 06/20/2018   Primary osteoarthritis of both knees 02/16/2017   Impingement syndrome of shoulder 07/02/2015   Complete rotator cuff rupture of left shoulder 07/02/2015   Cervical radiculitis 07/02/2015   Impingement syndrome of both shoulders 07/02/2015   Joint pain 04/10/2015   Arthritis sicca 04/09/2015   Accumulation of fluid in tissues 04/09/2015   Essential hypertension 04/09/2015   H/O eczema 04/09/2015  Hyperthyroidism 04/09/2015   Arthritis 04/09/2015   Fatigue 04/09/2015    Past Surgical History:  Procedure Laterality Date   ROTATOR CUFF REPAIR Right    TUBAL LIGATION      Family History        Family Status  Relation Name Status   Mother  Deceased   Father  Deceased   Sister  Alive   Brother  Alive   MGM  Deceased   PGF  Deceased   Sister  Alive   Sister  Alive   Sister  Alive   Brother  Alive   Brother  Alive        Her family history includes CAD in her mother; Diabetes in her maternal grandmother and paternal grandfather; Hypertension in her father; Lung cancer in her father;  Throat cancer in her mother.      Allergies  Allergen Reactions   Effexor [Venlafaxine] Anaphylaxis   Diazepam     Other reaction(s): Unknown   Synvisc [Hylan G-F 20] Other (See Comments)    Myalgias, hand pain, back pain; Benadryl helped pain   Tramadol Nausea Only     Current Outpatient Medications:    acetaminophen (TYLENOL) 500 MG tablet, Take 1,000 mg by mouth every 6 (six) hours as needed., Disp: , Rfl:    Ascorbic Acid (VITAMIN C ER PO), Take by mouth., Disp: , Rfl:    cyclobenzaprine (FLEXERIL) 5 MG tablet, TAKE 1 TABLET(5 MG) BY MOUTH AT BEDTIME, Disp: 90 tablet, Rfl: 1   DILT-XR 240 MG 24 hr capsule, TAKE 1 CAPSULE(240 MG) BY MOUTH DAILY, Disp: 90 capsule, Rfl: 1   glucose blood (CONTOUR NEXT TEST) test strip, To check BG BID, Disp: 200 each, Rfl: 3   meloxicam (MOBIC) 15 MG tablet, , Disp: , Rfl: 1   Microlet Lancets MISC, To check BG BID, Disp: 200 each, Rfl: 3   Multiple Vitamin (MULTIVITAMIN) tablet, Take 1 tablet by mouth daily., Disp: , Rfl:    TURMERIC CURCUMIN PO, Take 1 tablet by mouth daily., Disp: , Rfl:    loteprednol (LOTEMAX) 0.5 % ophthalmic suspension, INSTILL 1 DROP INTO OU QID FOR 1 WEEK THEN BID FOR 1 WEEK, Disp: , Rfl:    methylPREDNISolone (MEDROL) 4 MG tablet, Take 1 tablet (4 mg total) by mouth daily. (Patient not taking: Reported on 01/03/2019), Disp: 7 tablet, Rfl: 0   Olopatadine HCl 0.2 % SOLN, Place 1 drop into both eyes daily as needed., Disp: , Rfl:    Patient Care Team: Mar Daring, PA-C as PCP - General (Family Medicine)    Objective:    Vitals: BP 131/84 (BP Location: Left Arm, Patient Position: Sitting, Cuff Size: Large)    Pulse 71    Temp (!) 97 F (36.1 C) (Other (Comment))    Resp 16    Ht 5\' 5"  (1.651 m)    Wt 189 lb 3.2 oz (85.8 kg)    BMI 31.48 kg/m    Vitals:   06/29/19 1411  BP: 131/84  Pulse: 71  Resp: 16  Temp: (!) 97 F (36.1 C)  TempSrc: Other (Comment)  Weight: 189 lb 3.2 oz (85.8 kg)    Height: 5\' 5"  (1.651 m)     Physical Exam Vitals signs reviewed.  Constitutional:      General: She is not in acute distress.    Appearance: Normal appearance. She is well-developed. She is obese. She is not ill-appearing or diaphoretic.  HENT:     Head: Normocephalic and  atraumatic.     Right Ear: Tympanic membrane, ear canal and external ear normal.     Left Ear: Tympanic membrane, ear canal and external ear normal.     Nose: Nose normal.     Mouth/Throat:     Mouth: Mucous membranes are moist.     Pharynx: Oropharynx is clear. No oropharyngeal exudate or posterior oropharyngeal erythema.  Eyes:     General: No scleral icterus.       Right eye: No discharge.        Left eye: No discharge.     Extraocular Movements: Extraocular movements intact.     Conjunctiva/sclera: Conjunctivae normal.     Pupils: Pupils are equal, round, and reactive to light.  Neck:     Musculoskeletal: Normal range of motion and neck supple.     Thyroid: No thyromegaly.     Vascular: No carotid bruit or JVD.     Trachea: No tracheal deviation.  Cardiovascular:     Rate and Rhythm: Normal rate and regular rhythm.     Pulses: Normal pulses.     Heart sounds: Normal heart sounds. No murmur. No friction rub. No gallop.   Pulmonary:     Effort: Pulmonary effort is normal. No respiratory distress.     Breath sounds: Normal breath sounds. No wheezing or rales.  Chest:     Chest wall: No tenderness.     Breasts:        Right: Normal.        Left: Normal.  Abdominal:     General: Bowel sounds are normal. There is no distension.     Palpations: Abdomen is soft. There is no mass.     Tenderness: There is no abdominal tenderness. There is no guarding or rebound.  Musculoskeletal: Normal range of motion.        General: No tenderness.     Right lower leg: No edema.     Left lower leg: No edema.  Lymphadenopathy:     Cervical: No cervical adenopathy.  Skin:    General: Skin is warm and dry.      Capillary Refill: Capillary refill takes less than 2 seconds.     Findings: No rash.  Neurological:     General: No focal deficit present.     Mental Status: She is alert and oriented to person, place, and time. Mental status is at baseline.  Psychiatric:        Mood and Affect: Mood normal.        Behavior: Behavior normal.        Thought Content: Thought content normal.        Judgment: Judgment normal.      Depression Screen PHQ 2/9 Scores 06/29/2019 01/15/2019 06/20/2018 06/15/2017  PHQ - 2 Score 0 0 0 0  PHQ- 9 Score - - - -       Assessment & Plan:     Routine Health Maintenance and Physical Exam  Exercise Activities and Dietary recommendations Goals   None     Immunization History  Administered Date(s) Administered   Influenza Inj Mdck Quad Pf 09/19/2018   Influenza,inj,Quad PF,6+ Mos 07/03/2014   Zoster 11/08/2011    Health Maintenance  Topic Date Due   OPHTHALMOLOGY EXAM  07/04/1966   HIV Screening  07/05/1971   TETANUS/TDAP  07/05/1975   INFLUENZA VACCINE  04/14/2019   URINE MICROALBUMIN  07/04/2019   PNEUMOCOCCAL POLYSACCHARIDE VACCINE AGE 82-64 HIGH RISK  07/04/2019 (Originally 07/04/1958)  FOOT EXAM  07/04/2019   HEMOGLOBIN A1C  07/05/2019   MAMMOGRAM  06/02/2020   PAP SMEAR-Modifier  06/20/2021   Fecal DNA (Cologuard)  07/05/2021   Hepatitis C Screening  Completed     Discussed health benefits of physical activity, and encouraged her to engage in regular exercise appropriate for her age and condition.    1. Annual physical exam Normal physical exam today. Will check labs as below and f/u pending lab results. If labs are stable and WNL she will not need to have these rechecked for one year at her next annual physical exam. She is to call the office in the meantime if she has any acute issue, questions or concerns. - CBC with Differential/Platelet - Comprehensive metabolic panel  2. Essential hypertension Since patient is now  diabetic as well we will stop the diltiazem XR 240mg  and change to losartan 100mg  as below. I will see her back in 4-6 weeks for BP recheck.  - losartan (COZAAR) 100 MG tablet; Take 1 tablet (100 mg total) by mouth daily.  Dispense: 90 tablet; Refill: 3  3. Type 2 diabetes mellitus without complication, without long-term current use of insulin (HCC) Stable. Will check labs as below and f/u pending results. - Hemoglobin A1c - losartan (COZAAR) 100 MG tablet; Take 1 tablet (100 mg total) by mouth daily.  Dispense: 90 tablet; Refill: 3  4. Hyperthyroidism Asymptomatic. Will check labs as below and f/u pending results. - TSH  5. Moderate mixed hyperlipidemia not requiring statin therapy H/O this. Patient has declined statins in the past. Will check labs as below and f/u pending results. - Lipid panel  6. Class 1 obesity due to excess calories with serious comorbidity and body mass index (BMI) of 31.0 to 31.9 in adult Counseled patient on healthy lifestyle modifications including dieting and exercise.  She is walking more.   7. Need for influenza vaccination Flu vaccine given today without complication. Patient sat upright for 15 minutes to check for adverse reaction before being released. - Flu Vaccine QUAD 36+ mos IM  8. Need for tetanus booster Tdap Vaccine given to patient without complications. Patient sat for 15 minutes after administration and was tolerated well without adverse effects. - Tdap vaccine greater than or equal to 7yo IM  --------------------------------------------------------------------    Mar Daring, PA-C  [facility]

## 2019-06-29 ENCOUNTER — Encounter: Payer: Self-pay | Admitting: Physician Assistant

## 2019-06-29 ENCOUNTER — Other Ambulatory Visit: Payer: Self-pay

## 2019-06-29 ENCOUNTER — Ambulatory Visit (INDEPENDENT_AMBULATORY_CARE_PROVIDER_SITE_OTHER): Payer: BC Managed Care – PPO | Admitting: Physician Assistant

## 2019-06-29 VITALS — BP 131/84 | HR 71 | Temp 97.0°F | Resp 16 | Ht 65.0 in | Wt 189.2 lb

## 2019-06-29 DIAGNOSIS — E0590 Thyrotoxicosis, unspecified without thyrotoxic crisis or storm: Secondary | ICD-10-CM | POA: Diagnosis not present

## 2019-06-29 DIAGNOSIS — E782 Mixed hyperlipidemia: Secondary | ICD-10-CM

## 2019-06-29 DIAGNOSIS — E6609 Other obesity due to excess calories: Secondary | ICD-10-CM

## 2019-06-29 DIAGNOSIS — Z6831 Body mass index (BMI) 31.0-31.9, adult: Secondary | ICD-10-CM

## 2019-06-29 DIAGNOSIS — Z23 Encounter for immunization: Secondary | ICD-10-CM | POA: Diagnosis not present

## 2019-06-29 DIAGNOSIS — E119 Type 2 diabetes mellitus without complications: Secondary | ICD-10-CM | POA: Diagnosis not present

## 2019-06-29 DIAGNOSIS — Z0000 Encounter for general adult medical examination without abnormal findings: Secondary | ICD-10-CM

## 2019-06-29 DIAGNOSIS — I10 Essential (primary) hypertension: Secondary | ICD-10-CM | POA: Diagnosis not present

## 2019-06-29 MED ORDER — LOSARTAN POTASSIUM 100 MG PO TABS: 100.0000 mg | ORAL_TABLET | Freq: Every day | ORAL | 3 refills | Status: DC

## 2019-06-29 NOTE — Patient Instructions (Signed)
Health Maintenance for Postmenopausal Women Menopause is a normal process in which your ability to get pregnant comes to an end. This process happens slowly over many months or years, usually between the ages of 62 and 18. Menopause is complete when you have missed your menstrual periods for 12 months. It is important to talk with your health care provider about some of the most common conditions that affect women after menopause (postmenopausal women). These include heart disease, cancer, and bone loss (osteoporosis). Adopting a healthy lifestyle and getting preventive care can help to promote your health and wellness. The actions you take can also lower your chances of developing some of these common conditions. What should I know about menopause? During menopause, you may get a number of symptoms, such as:  Hot flashes. These can be moderate or severe.  Night sweats.  Decrease in sex drive.  Mood swings.  Headaches.  Tiredness.  Irritability.  Memory problems.  Insomnia. Choosing to treat or not to treat these symptoms is a decision that you make with your health care provider. Do I need hormone replacement therapy?  Hormone replacement therapy is effective in treating symptoms that are caused by menopause, such as hot flashes and night sweats.  Hormone replacement carries certain risks, especially as you become older. If you are thinking about using estrogen or estrogen with progestin, discuss the benefits and risks with your health care provider. What is my risk for heart disease and stroke? The risk of heart disease, heart attack, and stroke increases as you age. One of the causes may be a change in the body's hormones during menopause. This can affect how your body uses dietary fats, triglycerides, and cholesterol. Heart attack and stroke are medical emergencies. There are many things that you can do to help prevent heart disease and stroke. Watch your blood pressure  High  blood pressure causes heart disease and increases the risk of stroke. This is more likely to develop in people who have high blood pressure readings, are of African descent, or are overweight.  Have your blood pressure checked: ? Every 3-5 years if you are 76-7 years of age. ? Every year if you are 45 years old or older. Eat a healthy diet   Eat a diet that includes plenty of vegetables, fruits, low-fat dairy products, and lean protein.  Do not eat a lot of foods that are high in solid fats, added sugars, or sodium. Get regular exercise Get regular exercise. This is one of the most important things you can do for your health. Most adults should:  Try to exercise for at least 150 minutes each week. The exercise should increase your heart rate and make you sweat (moderate-intensity exercise).  Try to do strengthening exercises at least twice each week. Do these in addition to the moderate-intensity exercise.  Spend less time sitting. Even light physical activity can be beneficial. Other tips  Work with your health care provider to achieve or maintain a healthy weight.  Do not use any products that contain nicotine or tobacco, such as cigarettes, e-cigarettes, and chewing tobacco. If you need help quitting, ask your health care provider.  Know your numbers. Ask your health care provider to check your cholesterol and your blood sugar (glucose). Continue to have your blood tested as directed by your health care provider. Do I need screening for cancer? Depending on your health history and family history, you may need to have cancer screening at different stages of your life. This  may include screening for:  Breast cancer.  Cervical cancer.  Lung cancer.  Colorectal cancer. What is my risk for osteoporosis? After menopause, you may be at increased risk for osteoporosis. Osteoporosis is a condition in which bone destruction happens more quickly than new bone creation. To help prevent  osteoporosis or the bone fractures that can happen because of osteoporosis, you may take the following actions:  If you are 33-37 years old, get at least 1,000 mg of calcium and at least 600 mg of vitamin D per day.  If you are older than age 81 but younger than age 96, get at least 1,200 mg of calcium and at least 600 mg of vitamin D per day.  If you are older than age 21, get at least 1,200 mg of calcium and at least 800 mg of vitamin D per day. Smoking and drinking excessive alcohol increase the risk of osteoporosis. Eat foods that are rich in calcium and vitamin D, and do weight-bearing exercises several times each week as directed by your health care provider. How does menopause affect my mental health? Depression may occur at any age, but it is more common as you become older. Common symptoms of depression include:  Low or sad mood.  Changes in sleep patterns.  Changes in appetite or eating patterns.  Feeling an overall lack of motivation or enjoyment of activities that you previously enjoyed.  Frequent crying spells. Talk with your health care provider if you think that you are experiencing depression. General instructions See your health care provider for regular wellness exams and vaccines. This may include:  Scheduling regular health, dental, and eye exams.  Getting and maintaining your vaccines. These include: ? Influenza vaccine. Get this vaccine each year before the flu season begins. ? Pneumonia vaccine. ? Shingles vaccine. ? Tetanus, diphtheria, and pertussis (Tdap) booster vaccine. Your health care provider may also recommend other immunizations. Tell your health care provider if you have ever been abused or do not feel safe at home. Summary  Menopause is a normal process in which your ability to get pregnant comes to an end.  This condition causes hot flashes, night sweats, decreased interest in sex, mood swings, headaches, or lack of sleep.  Treatment for this  condition may include hormone replacement therapy.  Take actions to keep yourself healthy, including exercising regularly, eating a healthy diet, watching your weight, and checking your blood pressure and blood sugar levels.  Get screened for cancer and depression. Make sure that you are up to date with all your vaccines. This information is not intended to replace advice given to you by your health care provider. Make sure you discuss any questions you have with your health care provider. Document Released: 10/22/2005 Document Revised: 08/23/2018 Document Reviewed: 08/23/2018 Elsevier Patient Education  2020 [place]. Losartan tablets What is this medicine? LOSARTAN (loe SAR tan) is used to treat high blood pressure and to reduce the risk of stroke in certain patients. This drug also slows the progression of kidney disease in patients with diabetes. This medicine may be used for other purposes; ask your health care provider or pharmacist if you have questions. COMMON BRAND NAME(S): Cozaar What should I tell my health care provider before I take this medicine? They need to know if you have any of these conditions:  heart failure  kidney or liver disease  an unusual or allergic reaction to losartan, other medicines, foods, dyes, or preservatives  pregnant or trying to get pregnant  breast-feeding How should I use this medicine? Take this medicine by mouth with a glass of water. Follow the directions on the prescription label. This medicine can be taken with or without food. Take your doses at regular intervals. Do not take your medicine more often than directed. Talk to your pediatrician regarding the use of this medicine in children. Special care may be needed. Overdosage: If you think you have taken too much of this medicine contact a poison control center or emergency room at once. NOTE: This medicine is only for you. Do not share this medicine with others. What if I miss a  dose? If you miss a dose, take it as soon as you can. If it is almost time for your next dose, take only that dose. Do not take double or extra doses. What may interact with this medicine?  blood pressure medicines  diuretics, especially triamterene, spironolactone, or amiloride  fluconazole  NSAIDs, medicines for pain and inflammation, like ibuprofen or naproxen  potassium salts or potassium supplements  rifampin This list may not describe all possible interactions. Give your health care provider a list of all the medicines, herbs, non-prescription drugs, or dietary supplements you use. Also tell them if you smoke, drink alcohol, or use illegal drugs. Some items may interact with your medicine. What should I watch for while using this medicine? Visit your doctor or health care professional for regular checks on your progress. Check your blood pressure as directed. Ask your doctor or health care professional what your blood pressure should be and when you should contact him or her. Call your doctor or health care professional if you notice an irregular or fast heart beat. Women should inform their doctor if they wish to become pregnant or think they might be pregnant. There is a potential for serious side effects to an unborn child, particularly in the second or third trimester. Talk to your health care professional or pharmacist for more information. You may get drowsy or dizzy. Do not drive, use machinery, or do anything that needs mental alertness until you know how this drug affects you. Do not stand or sit up quickly, especially if you are an older patient. This reduces the risk of dizzy or fainting spells. Alcohol can make you more drowsy and dizzy. Avoid alcoholic drinks. Avoid salt substitutes unless you are told otherwise by your doctor or health care professional. Do not treat yourself for coughs, colds, or pain while you are taking this medicine without asking your doctor or health  care professional for advice. Some ingredients may increase your blood pressure. What side effects may I notice from receiving this medicine? Side effects that you should report to your doctor or health care professional as soon as possible:  confusion, dizziness, light headedness or fainting spells  decreased amount of urine passed  difficulty breathing or swallowing, hoarseness, or tightening of the throat  fast or irregular heart beat, palpitations, or chest pain  skin rash, itching  swelling of your face, lips, tongue, hands, or feet Side effects that usually do not require medical attention (report to your doctor or health care professional if they continue or are bothersome):  cough  decreased sexual function or desire  headache  nasal congestion or stuffiness  nausea or stomach pain  sore or cramping muscles This list may not describe all possible side effects. Call your doctor for medical advice about side effects. You may report side effects to FDA at 1-800-FDA-1088. Where should I keep my  medicine? Keep out of the reach of children. Store at room temperature between 15 and 30 degrees C (59 and 86 degrees F). Protect from light. Keep container tightly closed. Throw away any unused medicine after the expiration date. NOTE: This sheet is a summary. It may not cover all possible information. If you have questions about this medicine, talk to your doctor, pharmacist, or health care provider.  2020 Elsevier/Gold Standard (2007-11-10 16:42:18)

## 2019-06-30 LAB — CBC WITH DIFFERENTIAL/PLATELET
Basophils Absolute: 0.1 10*3/uL (ref 0.0–0.2)
Basos: 1 %
EOS (ABSOLUTE): 0.3 10*3/uL (ref 0.0–0.4)
Eos: 3 %
Hematocrit: 39.2 % (ref 34.0–46.6)
Hemoglobin: 13.5 g/dL (ref 11.1–15.9)
Immature Grans (Abs): 0 10*3/uL (ref 0.0–0.1)
Immature Granulocytes: 0 %
Lymphocytes Absolute: 3.4 10*3/uL — ABNORMAL HIGH (ref 0.7–3.1)
Lymphs: 33 %
MCH: 31.5 pg (ref 26.6–33.0)
MCHC: 34.4 g/dL (ref 31.5–35.7)
MCV: 92 fL (ref 79–97)
Monocytes Absolute: 0.8 10*3/uL (ref 0.1–0.9)
Monocytes: 8 %
Neutrophils Absolute: 5.8 10*3/uL (ref 1.4–7.0)
Neutrophils: 55 %
Platelets: 417 10*3/uL (ref 150–450)
RBC: 4.28 x10E6/uL (ref 3.77–5.28)
RDW: 12.2 % (ref 11.7–15.4)
WBC: 10.4 10*3/uL (ref 3.4–10.8)

## 2019-06-30 LAB — COMPREHENSIVE METABOLIC PANEL
ALT: 18 IU/L (ref 0–32)
AST: 17 IU/L (ref 0–40)
Albumin/Globulin Ratio: 1.6 (ref 1.2–2.2)
Albumin: 4.4 g/dL (ref 3.8–4.8)
Alkaline Phosphatase: 84 IU/L (ref 39–117)
BUN/Creatinine Ratio: 23 (ref 12–28)
BUN: 19 mg/dL (ref 8–27)
Bilirubin Total: 0.2 mg/dL (ref 0.0–1.2)
CO2: 24 mmol/L (ref 20–29)
Calcium: 9.5 mg/dL (ref 8.7–10.3)
Chloride: 103 mmol/L (ref 96–106)
Creatinine, Ser: 0.84 mg/dL (ref 0.57–1.00)
GFR calc Af Amer: 86 mL/min/{1.73_m2} (ref >59)
GFR calc non Af Amer: 75 mL/min/{1.73_m2} (ref >59)
Globulin, Total: 2.7 g/dL (ref 1.5–4.5)
Glucose: 97 mg/dL (ref 65–99)
Potassium: 4.3 mmol/L (ref 3.5–5.2)
Sodium: 138 mmol/L (ref 134–144)
Total Protein: 7.1 g/dL (ref 6.0–8.5)

## 2019-06-30 LAB — LIPID PANEL
Chol/HDL Ratio: 3.5 ratio (ref 0.0–4.4)
Cholesterol, Total: 179 mg/dL (ref 100–199)
HDL: 51 mg/dL (ref >39)
LDL Chol Calc (NIH): 113 mg/dL — ABNORMAL HIGH (ref 0–99)
Triglycerides: 79 mg/dL (ref 0–149)
VLDL Cholesterol Cal: 15 mg/dL (ref 5–40)

## 2019-06-30 LAB — TSH: TSH: 3.7 u[IU]/mL (ref 0.450–4.500)

## 2019-06-30 LAB — HEMOGLOBIN A1C
Est. average glucose Bld gHb Est-mCnc: 143 mg/dL
Hgb A1c MFr Bld: 6.6 % — ABNORMAL HIGH (ref 4.8–5.6)

## 2019-07-02 ENCOUNTER — Telehealth: Payer: Self-pay

## 2019-07-02 NOTE — Telephone Encounter (Signed)
-----   Message from Mar Daring, PA-C sent at 07/02/2019 11:17 AM EDT ----- Blood count is normal and stable. Kidney and liver function are normal. Sodium, potassium and calcium are normal. A1c is improved from 6 months ago from 7.2 to now 6.6. Fasting sugar was great at 97. Cholesterol also improved slightly from last year. Keep up the good work. Thyroid is normal.

## 2019-07-02 NOTE — Telephone Encounter (Signed)
Patient advised as below. Patient verbalizes understanding and is in agreement with treatment plan.  

## 2019-07-05 ENCOUNTER — Telehealth: Payer: Self-pay | Admitting: Physician Assistant

## 2019-07-05 NOTE — Telephone Encounter (Signed)
Patient reports that on Friday night she felt her heart beat very fast all night. Patient reports that she not sleeping well. Patient reports back pain Friday night and cramps. Patient reports she is feeling better. Patient reports she is still taking diltiazem and has not switched to losartan. Please advise.

## 2019-07-05 NOTE — Telephone Encounter (Signed)
Patient advised as below. Patient verbalizes understanding and is in agreement with treatment plan.  

## 2019-07-05 NOTE — Telephone Encounter (Signed)
I am glad she is feeling better now.   As discussed in the office visit, now that she is diabetic it may be better to be on the losartan instead to protect her kidneys. Since she is feeling better it may be a safe time to change medications.

## 2019-07-05 NOTE — Telephone Encounter (Signed)
Pt needing a call back regarding her heart rate getting high since Fri night.  She got the flu and tetanus shot and has not been feeling right since.  Please call pt back at [phone].  Thanks, [employer]

## 2019-08-02 ENCOUNTER — Telehealth: Payer: Self-pay

## 2019-08-02 NOTE — Telephone Encounter (Signed)
Copied from [place] [id number]. Topic: General - Other >> Aug 02, 2019 11:41 AM Sheran Luz wrote: Patient inquiring if she can get "prioritized" to get a covid test by getting letter from PCP. Patient states she is diabetic and the lines at [facility] are too long. Advised patient of other sites, she is requesting call back. Patient is scheduled for virtual visit on Monday.

## 2019-08-02 NOTE — Telephone Encounter (Signed)
Patient advised to [facility] for testing. Unfortunately we cannot provide a letter.

## 2019-08-04 DIAGNOSIS — Z20828 Contact with and (suspected) exposure to other viral communicable diseases: Secondary | ICD-10-CM | POA: Diagnosis not present

## 2019-08-04 DIAGNOSIS — L299 Pruritus, unspecified: Secondary | ICD-10-CM | POA: Diagnosis not present

## 2019-08-04 DIAGNOSIS — J029 Acute pharyngitis, unspecified: Secondary | ICD-10-CM | POA: Diagnosis not present

## 2019-08-06 ENCOUNTER — Ambulatory Visit (INDEPENDENT_AMBULATORY_CARE_PROVIDER_SITE_OTHER): Payer: BC Managed Care – PPO | Admitting: Physician Assistant

## 2019-08-06 ENCOUNTER — Encounter: Payer: Self-pay | Admitting: Physician Assistant

## 2019-08-06 ENCOUNTER — Other Ambulatory Visit: Payer: Self-pay

## 2019-08-06 VITALS — BP 160/87 | HR 74 | Temp 97.6°F | Wt 190.0 lb

## 2019-08-06 DIAGNOSIS — J029 Acute pharyngitis, unspecified: Secondary | ICD-10-CM | POA: Diagnosis not present

## 2019-08-06 DIAGNOSIS — I10 Essential (primary) hypertension: Secondary | ICD-10-CM

## 2019-08-06 MED ORDER — DILTIAZEM HCL ER BEADS 120 MG PO CP24: 120.0000 mg | ORAL_CAPSULE | Freq: Every day | ORAL | 1 refills | Status: DC

## 2019-08-06 NOTE — Patient Instructions (Signed)
Diltiazem extended-release capsules or tablets What is this medicine? DILTIAZEM (dil TYE a zem) is a calcium-channel blocker. It affects the amount of calcium found in your heart and muscle cells. This relaxes your blood vessels, which can reduce the amount of work the heart has to do. This medicine is used to treat high blood pressure and chest pain caused by angina. This medicine may be used for other purposes; ask your health care provider or pharmacist if you have questions. COMMON BRAND NAME(S): Cardizem CD, Cardizem LA, Cardizem SR, Cartia XT, Dilacor XR, Dilt-CD, Diltia XT, Diltzac, Matzim LA, Rema Fendt, TIADYLT ER, Tiamate, Tiazac What should I tell my health care provider before I take this medicine? They need to know if you have any of these conditions:  heart problems, low blood pressure, irregular heartbeat  liver disease  previous heart attack  an unusual or allergic reaction to diltiazem, other medicines, foods, dyes, or preservatives  pregnant or trying to get pregnant  breast-feeding How should I use this medicine? Take this medicine by mouth with a glass of water. Follow the directions on the prescription label. Swallow whole, do not crush or chew. Ask your doctor or pharmacist if your should take this medicine with food. Take your doses at regular intervals. Do not take your medicine more often then directed. Do not stop taking except on the advice of your doctor or health care professional. Ask your doctor or health care professional how to gradually reduce the dose. Talk to your pediatrician regarding the use of this medicine in children. Special care may be needed. Overdosage: If you think you have taken too much of this medicine contact a poison control center or emergency room at once. NOTE: This medicine is only for you. Do not share this medicine with others. What if I miss a dose? If you miss a dose, take it as soon as you can. If it is almost time for your next  dose, take only that dose. Do not take double or extra doses. What may interact with this medicine? Do not take this medicine with any of the following medications:  cisapride  hawthorn  pimozide  ranolazine  red yeast rice This medicine may also interact with the following medications:  buspirone  carbamazepine  cimetidine  cyclosporine  digoxin  local anesthetics or general anesthetics  lovastatin  medicines for anxiety or difficulty sleeping like midazolam and triazolam  medicines for high blood pressure or heart problems  quinidine  rifampin, rifabutin, or rifapentine This list may not describe all possible interactions. Give your health care provider a list of all the medicines, herbs, non-prescription drugs, or dietary supplements you use. Also tell them if you smoke, drink alcohol, or use illegal drugs. Some items may interact with your medicine. What should I watch for while using this medicine? Check your blood pressure and pulse rate regularly. Ask your doctor or health care professional what your blood pressure and pulse rate should be and when you should contact him or her. You may feel dizzy or lightheaded. Do not drive, use machinery, or do anything that needs mental alertness until you know how this medicine affects you. To reduce the risk of dizzy or fainting spells, do not sit or stand up quickly, especially if you are an older patient. Alcohol can make you more dizzy or increase flushing and rapid heartbeats. Avoid alcoholic drinks. What side effects may I notice from receiving this medicine? Side effects that you should report to your doctor  or health care professional as soon as possible:  allergic reactions like skin rash, itching or hives, swelling of the face, lips, or tongue  confusion, mental depression  feeling faint or lightheaded, falls  redness, blistering, peeling or loosening of the skin, including inside the mouth  slow, irregular  heartbeat  swelling of the feet and ankles  unusual bleeding or bruising, pinpoint red spots on the skin Side effects that usually do not require medical attention (report to your doctor or health care professional if they continue or are bothersome):  constipation or diarrhea  difficulty sleeping  facial flushing  headache  nausea, vomiting  sexual dysfunction  weak or tired This list may not describe all possible side effects. Call your doctor for medical advice about side effects. You may report side effects to FDA at 1-800-FDA-1088. Where should I keep my medicine? Keep out of the reach of children. Store at room temperature between 15 and 30 degrees C (59 and 86 degrees F). Protect from humidity. Throw away any unused medicine after the expiration date. NOTE: This sheet is a summary. It may not cover all possible information. If you have questions about this medicine, talk to your doctor, pharmacist, or health care provider.  2020 Elsevier/Gold Standard (2007-12-21 14:35:47)

## 2019-08-06 NOTE — Progress Notes (Signed)
Patient: Molly Potter Female    DOB: [birth year]   63 y.o.   MRN: [id number] Visit Date: 08/06/2019  Today's Provider: Mar Daring, PA-C   Chief Complaint  Patient presents with  . Sore Throat   Subjective:    I,Joseline E. Rosas,RMA am acting as a Education administrator for [employer], PA-C.   Virtual Visit via Telephone Note  I connected with Molly Potter on 08/06/19 at  2:40 PM EST by telephone and verified that I am speaking with the correct person using two identifiers.  Location: Patient: Home Provider: [facility]   I discussed the limitations, risks, security and privacy concerns of performing an evaluation and management service by telephone and the availability of in person appointments. I also discussed with the patient that there may be a patient responsible charge related to this service. The patient expressed understanding and agreed to proceed.   Sore Throat  This is a new (Got Covid tested on 11/21) problem. The current episode started 1 to 4 weeks ago. The problem has been gradually improving. There has been no fever. Pertinent negatives include no abdominal pain, coughing, ear discharge, ear pain, headaches, shortness of breath, swollen glands, trouble swallowing or vomiting. She has tried gargles (Salt water gargles and chest rub.) for the symptoms. The treatment provided no relief.  COVID test pending.  Patient reports that her blood pressure was190/96 [employer] on 08/04/2019.   Sore throat has now improved.  She is more concerned about her BP at this time. She had previously been well controlled on Diltiazem XR 240mg  but this was changed to losartan due to her recent diagnosis of T2DM. Since she has noticed her BP increasing and heart palpitations increasing.   Allergies  Allergen Reactions  . Effexor [Venlafaxine] Anaphylaxis  . Diazepam     Other reaction(s): Unknown  . Synvisc [Hylan G-F 20] Other (See Comments)    Myalgias, hand pain, back pain;  Benadryl helped pain  . Tramadol Nausea Only     Current Outpatient Medications:  .  acetaminophen (TYLENOL) 500 MG tablet, Take 1,000 mg by mouth every 6 (six) hours as needed., Disp: , Rfl:  .  Ascorbic Acid (VITAMIN C ER PO), Take by mouth., Disp: , Rfl:  .  cyclobenzaprine (FLEXERIL) 5 MG tablet, TAKE 1 TABLET(5 MG) BY MOUTH AT BEDTIME, Disp: 90 tablet, Rfl: 1 .  glucose blood (CONTOUR NEXT TEST) test strip, To check BG BID, Disp: 200 each, Rfl: 3 .  losartan (COZAAR) 100 MG tablet, Take 1 tablet (100 mg total) by mouth daily., Disp: 90 tablet, Rfl: 3 .  loteprednol (LOTEMAX) 0.5 % ophthalmic suspension, INSTILL 1 DROP INTO OU QID FOR 1 WEEK THEN BID FOR 1 WEEK, Disp: , Rfl:  .  meloxicam (MOBIC) 15 MG tablet, , Disp: , Rfl: 1 .  Microlet Lancets MISC, To check BG BID, Disp: 200 each, Rfl: 3 .  Multiple Vitamin (MULTIVITAMIN) tablet, Take 1 tablet by mouth daily., Disp: , Rfl:  .  Olopatadine HCl 0.2 % SOLN, Place 1 drop into both eyes daily as needed., Disp: , Rfl:  .  TURMERIC CURCUMIN PO, Take 1 tablet by mouth daily., Disp: , Rfl:   Review of Systems  Constitutional: Negative for chills, fatigue and fever.  HENT: Positive for postnasal drip, sinus pressure, sinus pain and sore throat. Negative for ear discharge, ear pain and trouble swallowing.   Eyes: Negative for visual disturbance.  Respiratory: Negative for cough,  chest tightness and shortness of breath.   Cardiovascular: Positive for palpitations. Negative for chest pain and leg swelling.  Gastrointestinal: Negative for abdominal pain, nausea and vomiting.  Neurological: Negative for dizziness, light-headedness and headaches.    Social History   Tobacco Use  . Smoking status: Never Smoker  . Smokeless tobacco: Never Used  Substance Use Topics  . Alcohol use: Not Currently    Comment: Occasional      Objective:   BP (!) 160/87 (BP Location: Left Arm, Patient Position: Sitting, Cuff Size: Large) Comment: 166/94 last  night  Pulse 74 Comment: 94 last night  Temp 97.6 F (36.4 C) (Oral)   Wt 190 lb (86.2 kg)   BMI 31.62 kg/m  Vitals:   08/06/19 1350  BP: (!) 160/87  Pulse: 74  Temp: 97.6 F (36.4 C)  TempSrc: Oral  Weight: 190 lb (86.2 kg)  Body mass index is 31.62 kg/m.   Physical Exam Vitals signs reviewed.  Constitutional:      General: She is not in acute distress.    Appearance: She is not ill-appearing.  Pulmonary:     Effort: Pulmonary effort is normal. No respiratory distress.  Neurological:     Mental Status: She is alert.     No results found for any visits on 08/06/19.     Assessment & Plan     1. Essential hypertension Increased. Restart Diltiazem XR but lower dose at 120mg . Continue Losartan 100mg . Return in 4 weeks for HTN f/u (ok for virtual).  - diltiazem (TIAZAC) 120 MG 24 hr capsule; Take 1 capsule (120 mg total) by mouth daily.  Dispense: 30 capsule; Refill: 1  2. Sore throat Improving now with salt water gargles, flonase sensimist and OTC allergy medication. Awaiting Covid-19 testing results. Will remain isolated until results known.    I discussed the assessment and treatment plan with the patient. The patient was provided an opportunity to ask questions and all were answered. The patient agreed with the plan and demonstrated an understanding of the instructions.   The patient was advised to call back or seek an in-person evaluation if the symptoms worsen or if the condition fails to improve as anticipated.  I provided 15 minutes of non-face-to-face time during this encounter.    Mar Daring, PA-C  [facility]

## 2019-08-07 ENCOUNTER — Telehealth: Payer: Self-pay

## 2019-08-07 NOTE — Telephone Encounter (Signed)
Pt stated she was tested for COVID at [facility] and it was negative.   Thanks,   -Mickel Baas

## 2019-08-07 NOTE — Telephone Encounter (Signed)
Copied from [place] [id number]. Topic: General - Other >> Aug 07, 2019  3:22 PM Greggory Keen D wrote: Reason for CRM: pt called saying she had an appt with Sonia Baller yesterday and has some "test results" that she wants to give jenny or her nurse.  Best Contact#   [phone]

## 2019-08-07 NOTE — Telephone Encounter (Signed)
Wonderful news.

## 2019-08-14 ENCOUNTER — Ambulatory Visit: Payer: Self-pay | Admitting: *Deleted

## 2019-08-14 ENCOUNTER — Telehealth: Payer: Self-pay | Admitting: Physician Assistant

## 2019-08-14 NOTE — Telephone Encounter (Signed)
Pt called with her b/p being 160/85 and HR 83 this morning. Second check was 170/79. And hr 89.  She has taken her medications today and had a virtual visit last week with her provider. Her b/p medications were readjusted at that time. She feels like what she was on previously was working better for her. She has not missed taking any of her medications. She would like a call back regarding her b/p readings  She denies blurred vision, headache, weakness, chest pain or shortness of breath.  Per protocol she should be seen within 3 days. Since she had a visit last week will check with the practice for recommendation of having a visit. Routing to [facility] for review and recommendation.  Reason for Disposition . Systolic BP  >= 0000000 OR Diastolic >= 123XX123  Answer Assessment - Initial Assessment Questions 1. BLOOD PRESSURE: "What is the blood pressure?" "Did you take at least two measurements 5 minutes apart?"    160/85 HR  83  And  170/79 HR 89 2. ONSET: "When did you take your blood pressure?"     This morning 3. HOW: "How did you obtain the blood pressure?" (e.g., visiting nurse, automatic home BP monitor)     Automatic home BP monitor 4. HISTORY: "Do you have a history of high blood pressure?"     yes 5. MEDICATIONS: "Are you taking any medications for blood pressure?" "Have you missed any doses recently?"     Yes taking b/p medication and have not missed any doses 6. OTHER SYMPTOMS: "Do you have any symptoms?" (e.g., headache, chest pain, blurred vision, difficulty breathing, weakness)    no 7. PREGNANCY: "Is there any chance you are pregnant?" "When was your last menstrual period?"     n/a  Protocols used: HIGH BLOOD PRESSURE-A-AH

## 2019-08-14 NOTE — Telephone Encounter (Signed)
From [facility] 

## 2019-08-14 NOTE — Telephone Encounter (Signed)
error 

## 2019-08-15 NOTE — Telephone Encounter (Signed)
Have her take 2 of her diltiazem 120mg  together at the same time. Continue losartan 100mg .

## 2019-08-15 NOTE — Telephone Encounter (Signed)
Patient advised.

## 2019-08-24 ENCOUNTER — Ambulatory Visit (INDEPENDENT_AMBULATORY_CARE_PROVIDER_SITE_OTHER): Payer: BC Managed Care – PPO | Admitting: Physician Assistant

## 2019-08-24 ENCOUNTER — Encounter: Payer: Self-pay | Admitting: Physician Assistant

## 2019-08-24 ENCOUNTER — Other Ambulatory Visit: Payer: Self-pay

## 2019-08-24 ENCOUNTER — Ambulatory Visit: Payer: Self-pay | Admitting: Physician Assistant

## 2019-08-24 VITALS — BP 152/84 | HR 82 | Temp 97.6°F | Resp 16 | Wt 187.0 lb

## 2019-08-24 DIAGNOSIS — I10 Essential (primary) hypertension: Secondary | ICD-10-CM

## 2019-08-24 NOTE — Telephone Encounter (Signed)
From [facility] 

## 2019-08-24 NOTE — Telephone Encounter (Signed)
Pt reports BP trending up past 2 weeks. States this am 191/93, highest to date. Reports increased fatigue , headache at times. ALso reports HR has been elevated 102 this AM, "For a while then goes down." Pt steta BP medications changed 3 weeks ago "And hasn't been right since."   Pt reports CP at times mid chest, "But I've been exercising my arms and feels like it's from that. " Pain does not radiate, denies nausea, weakness, diaphoresis. BP yesterday 147/80, HR 84; does not have any additional values. Pt is about to go in to workplace. Reluctant to triage further, or wait on line. Asking for OV "To get checked out. Assured NT would route to practice for PCPs review. CAn do virtual "But I'd rather come in." Per covid screening pt requires virtual. Advised ED if symptoms worsen in frequency or intensity. Advised UC/Ed if no availability today. Pt verbalizes understanding. Please advise:  [phone]  Reason for Disposition . Systolic BP  >= 99991111 OR Diastolic >= A999333  Answer Assessment - Initial Assessment Questions 1. BLOOD PRESSURE: "What is the blood pressure?" "Did you take at least two measurements 5 minutes apart?"     191/93 2. ONSET: "When did you take your blood pressure?"    0800 3. HOW: "How did you obtain the blood pressure?" (e.g., visiting nurse, automatic home BP monitor)    Home monitor 4. HISTORY: "Do you have a history of high blood pressure?"    Yes 5. MEDICATIONS: "Are you taking any medications for blood pressure?" "Have you missed any doses recently?"     Yes, no missed doses 6. OTHER SYMPTOMS: "Do you have any symptoms?" (e.g., headache, chest pain, blurred vision, difficulty breathing, weakness)    Headache, increased fatigue, HR elevated at times  Protocols used: HIGH BLOOD PRESSURE-A-AH

## 2019-08-24 NOTE — Patient Instructions (Signed)

## 2019-08-24 NOTE — Progress Notes (Signed)
Patient: Molly Potter Female    DOB: November 20, 1955   63 y.o.   MRN: [id number] Visit Date: 08/24/2019  Today's Provider: Mar Daring, PA-C   No chief complaint on file.  Subjective:     HPI  Patient here with c/o BP trending up past 2 weeks.States this am 191/93, highest to date. Reports increased fatigue , headache at times. Also reports HR has been elevated 102 this AM, "For a while then goes down." Pt states BP medications changed 3 weeks ago "And hasn't been right since."  She denies nausea, weakness, diaphoresis. BP yesterday 147/80, HR 84 this was an evening reading. Reports that every day it has been 150/70's-80's. Reports that it hurts right in the middle of her chest sometimes. She has been doing some exercises her arms a lot. 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    Allergies  Allergen Reactions  . Effexor [Venlafaxine] Anaphylaxis  . Diazepam     Other reaction(s): Unknown  . Synvisc [Hylan G-F 20] Other (See Comments)    Myalgias, hand pain, back pain; Benadryl helped pain  . Tramadol Nausea Only     Current Outpatient Medications:  .  acetaminophen (TYLENOL) 500 MG tablet, Take 1,000 mg by mouth every 6 (six) hours as needed., Disp: , Rfl:  .  Ascorbic Acid (VITAMIN C ER PO), Take by mouth., Disp: , Rfl:  .  cyclobenzaprine (FLEXERIL) 5 MG tablet, TAKE 1 TABLET(5 MG) BY MOUTH AT BEDTIME, Disp: 90 tablet, Rfl: 1 .  diltiazem (TIAZAC) 120 MG 24 hr capsule, Take 1 capsule (120 mg total) by mouth daily., Disp: 30 capsule, Rfl: 1 .  glucose blood (CONTOUR NEXT TEST) test strip, To check BG BID, Disp: 200 each, Rfl: 3 .  losartan (COZAAR) 100 MG tablet, Take 1 tablet (100 mg total) by mouth daily., Disp: 90 tablet, Rfl: 3 .  loteprednol (LOTEMAX) 0.5 % ophthalmic suspension, INSTILL 1 DROP INTO OU QID FOR 1 WEEK THEN BID FOR 1 WEEK, Disp: , Rfl:  .  Microlet Lancets MISC, To check BG BID, Disp: 200 each, Rfl: 3 .   Olopatadine HCl 0.2 % SOLN, Place 1 drop into both eyes daily as needed., Disp: , Rfl:  .  TURMERIC CURCUMIN PO, Take 1 tablet by mouth daily., Disp: , Rfl:  .  meloxicam (MOBIC) 15 MG tablet, , Disp: , Rfl: 1 .  Multiple Vitamin (MULTIVITAMIN) tablet, Take 1 tablet by mouth daily., Disp: , Rfl:   Review of Systems  Constitutional: Negative.   Eyes: Negative for visual disturbance.  Respiratory: Negative.   Cardiovascular: Negative.   Gastrointestinal: Negative.   Musculoskeletal: Positive for arthralgias.  Neurological: Negative.     Social History   Tobacco Use  . Smoking status: Never Smoker  . Smokeless tobacco: Never Used  Substance Use Topics  . Alcohol use: Not Currently    Comment: Occasional      Objective:   BP (!) 152/84 (BP Location: Left Arm, Patient Position: Sitting, Cuff Size: Large) Comment: manual  Pulse 82   Temp 97.6 F (36.4 C) (Temporal)   Resp 16   Wt 187 lb (84.8 kg)   SpO2 96%   BMI 31.12 kg/m  Vitals:   08/24/19 1556  BP: (!) 152/84  Pulse: 82  Resp: 16  Temp: 97.6 F (36.4 C)  TempSrc: Temporal  SpO2: 96%  Weight: 187 lb (84.8 kg)  Body mass index  is 31.12 kg/m.   Physical Exam Vitals reviewed.  Constitutional:      General: She is not in acute distress.    Appearance: Normal appearance. She is well-developed. She is obese. She is not ill-appearing or diaphoretic.  Cardiovascular:     Rate and Rhythm: Normal rate and regular rhythm.     Pulses: Normal pulses.     Heart sounds: Normal heart sounds. No murmur. No friction rub. No gallop.   Pulmonary:     Effort: Pulmonary effort is normal. No respiratory distress.     Breath sounds: Normal breath sounds. No wheezing or rales.  Musculoskeletal:     Cervical back: Normal range of motion and neck supple.  Neurological:     Mental Status: She is alert.      No results found for any visits on 08/24/19.     Assessment & Plan    1. Essential hypertension Has been slightly  elevated. There is interaction of NSAIDs and ACE/ARBs being less effective when taken together. However, she benefits from meloxicam for arthralgias and an ARB for her T2DM. Will continue Losartan 100mg  daily and Continue Diltiazem XR 240mg . May have to increase to 300mg  if BP remains elevated. She agrees.      Mar Daring, PA-C  [facility]

## 2019-08-24 NOTE — Telephone Encounter (Signed)
She can be scheduled for my 4p today

## 2019-08-24 NOTE — Telephone Encounter (Signed)
LMTCB to offer patient 4 pm appt. With [employer].If patient calls back ok for [facility] to schedule

## 2019-08-25 DIAGNOSIS — Z20828 Contact with and (suspected) exposure to other viral communicable diseases: Secondary | ICD-10-CM | POA: Diagnosis not present

## 2019-08-27 MED ORDER — DILTIAZEM HCL ER COATED BEADS 240 MG PO CP24: 240.0000 mg | ORAL_CAPSULE | Freq: Every day | ORAL | 1 refills | Status: DC

## 2019-08-29 ENCOUNTER — Telehealth: Payer: Self-pay | Admitting: Physician Assistant

## 2019-08-29 DIAGNOSIS — I10 Essential (primary) hypertension: Secondary | ICD-10-CM

## 2019-08-29 MED ORDER — DILTIAZEM HCL ER BEADS 300 MG PO CP24: 300.0000 mg | ORAL_CAPSULE | Freq: Every day | ORAL | 1 refills | Status: DC

## 2019-08-29 NOTE — Telephone Encounter (Signed)
From [facility] 

## 2019-08-29 NOTE — Telephone Encounter (Signed)
Increased Diltiazem XR 300mg  sent in

## 2019-08-29 NOTE — Telephone Encounter (Signed)
Patient advised.

## 2019-08-29 NOTE — Telephone Encounter (Signed)
Pt had an appt on 08-24-2019 and her bp is still elevated and she would like to increase her diltiazem. [address]

## 2019-08-30 ENCOUNTER — Other Ambulatory Visit: Payer: Self-pay

## 2019-08-30 ENCOUNTER — Emergency Department
Admission: EM | Admit: 2019-08-30 | Discharge: 2019-08-30 | Disposition: A | Discharge status: Home / Self Care | Payer: BC Managed Care – PPO | Attending: Emergency Medicine | Admitting: Emergency Medicine

## 2019-08-30 ENCOUNTER — Telehealth: Payer: Self-pay

## 2019-08-30 ENCOUNTER — Encounter: Payer: Self-pay | Admitting: Emergency Medicine

## 2019-08-30 ENCOUNTER — Emergency Department: Payer: BC Managed Care – PPO

## 2019-08-30 DIAGNOSIS — E119 Type 2 diabetes mellitus without complications: Secondary | ICD-10-CM | POA: Insufficient documentation

## 2019-08-30 DIAGNOSIS — R0789 Other chest pain: Secondary | ICD-10-CM | POA: Diagnosis not present

## 2019-08-30 DIAGNOSIS — Z79899 Other long term (current) drug therapy: Secondary | ICD-10-CM | POA: Insufficient documentation

## 2019-08-30 DIAGNOSIS — I10 Essential (primary) hypertension: Secondary | ICD-10-CM | POA: Diagnosis not present

## 2019-08-30 DIAGNOSIS — R079 Chest pain, unspecified: Secondary | ICD-10-CM

## 2019-08-30 HISTORY — DX: Essential (primary) hypertension: I10

## 2019-08-30 LAB — CBC
HCT: 41.2 % (ref 36.0–46.0)
Hemoglobin: 13.8 g/dL (ref 12.0–15.0)
MCH: 31.5 pg (ref 26.0–34.0)
MCHC: 33.5 g/dL (ref 30.0–36.0)
MCV: 94.1 fL (ref 80.0–100.0)
Platelets: 419 10*3/uL — ABNORMAL HIGH (ref 150–400)
RBC: 4.38 MIL/uL (ref 3.87–5.11)
RDW: 12.2 % (ref 11.5–15.5)
WBC: 9.8 10*3/uL (ref 4.0–10.5)
nRBC: 0 % (ref 0.0–0.2)

## 2019-08-30 LAB — BASIC METABOLIC PANEL
Anion gap: 9 (ref 5–15)
BUN: 17 mg/dL (ref 8–23)
CO2: 25 mmol/L (ref 22–32)
Calcium: 9.6 mg/dL (ref 8.9–10.3)
Chloride: 107 mmol/L (ref 98–111)
Creatinine, Ser: 0.68 mg/dL (ref 0.44–1.00)
GFR calc Af Amer: >60 mL/min (ref >60)
GFR calc non Af Amer: >60 mL/min (ref >60)
Glucose, Bld: 137 mg/dL — ABNORMAL HIGH (ref 70–99)
Potassium: 4.2 mmol/L (ref 3.5–5.1)
Sodium: 141 mmol/L (ref 135–145)

## 2019-08-30 LAB — TROPONIN I (HIGH SENSITIVITY)
Troponin I (High Sensitivity): 4 ng/L (ref <18)
Troponin I (High Sensitivity): 5 ng/L (ref <18)

## 2019-08-30 NOTE — ED Provider Notes (Signed)
[facility] Emergency Department Provider Note   ____________________________________________   First MD Initiated Contact with Patient 08/30/19 1145     (approximate)  I have reviewed the triage vital signs and the nursing notes.   HISTORY  Chief Complaint Chest Pain    HPI Molly Potter is a 63 y.o. female with past medical history of arthritis, hypertension, diabetes who presents to the ED complaining of chest pain.  Patient reports that her PCP has recently been making changes to her blood pressure medicines, have transitioned her to losartan from diltiazem, but she restarted diltiazem when her blood pressure remained high.  Along with these changes, she has been having intermittent pain in her chest.  She describes it as a pressure that can come on at any time and is not exacerbated or alleviated by anything.  She denies any fevers, cough, shortness of breath, leg swelling/pain.  She denies any significant cardiac history and has never had similar symptoms in the past.        Past Medical History:  Diagnosis Date  . Arthritis   . Fatigue   . History of eczema   . Hypertension   . Hyperthyroidism   . Joint pain     Patient Active Problem List   Diagnosis Date Noted  . Type 2 diabetes mellitus without complication, without long-term current use of insulin ([place]) 01/03/2019  . Newly diagnosed diabetes ([place]) 07/03/2018  . Moderate mixed hyperlipidemia not requiring statin therapy 06/20/2018  . BMI 34.0-34.9,adult 06/20/2018  . Primary osteoarthritis of both knees 02/16/2017  . Impingement syndrome of shoulder 07/02/2015  . Complete rotator cuff rupture of left shoulder 07/02/2015  . Cervical radiculitis 07/02/2015  . Impingement syndrome of both shoulders 07/02/2015  . Joint pain 04/10/2015  . Arthritis sicca 04/09/2015  . Accumulation of fluid in tissues 04/09/2015  . Essential hypertension 04/09/2015  . H/O eczema 04/09/2015  .  Hyperthyroidism 04/09/2015  . Arthritis 04/09/2015  . Fatigue 04/09/2015    Past Surgical History:  Procedure Laterality Date  . ROTATOR CUFF REPAIR Right   . TUBAL LIGATION      Prior to Admission medications   Medication Sig Start Date End Date Taking? Authorizing Provider  acetaminophen (TYLENOL) 500 MG tablet Take 1,000 mg by mouth every 6 (six) hours as needed.    [provider]  Ascorbic Acid (VITAMIN C ER PO) Take by mouth.    [provider]  cyclobenzaprine (FLEXERIL) 5 MG tablet TAKE 1 TABLET(5 MG) BY MOUTH AT BEDTIME 06/15/19   Fenton Malling M, PA-C  diltiazem (TIAZAC) 300 MG 24 hr capsule Take 1 capsule (300 mg total) by mouth daily. 08/29/19   Mar Daring, PA-C  glucose blood (CONTOUR NEXT TEST) test strip To check BG BID 01/16/19   Fenton Malling M, PA-C  losartan (COZAAR) 100 MG tablet Take 1 tablet (100 mg total) by mouth daily. 06/29/19   Mar Daring, PA-C  loteprednol (LOTEMAX) 0.5 % ophthalmic suspension INSTILL 1 DROP INTO OU QID FOR 1 WEEK THEN BID FOR 1 WEEK 05/13/19   [provider]  meloxicam (MOBIC) 15 MG tablet  03/31/15   [provider]  Microlet Lancets MISC To check BG BID 01/16/19   Mar Daring, PA-C  Multiple Vitamin (MULTIVITAMIN) tablet Take 1 tablet by mouth daily.    [provider]  Olopatadine HCl 0.2 % SOLN Place 1 drop into both eyes daily as needed. 11/26/18   [provider]  TURMERIC CURCUMIN PO Take 1 tablet by mouth daily.    [provider]    Allergies Effexor [venlafaxine], Diazepam, Synvisc [hylan g-f 20], and Tramadol  Family History  Problem Relation Age of Onset  . CAD Mother   . Throat cancer Mother   . Hypertension Father   . Lung cancer Father   . Diabetes Maternal Grandmother   . Diabetes Paternal Grandfather     Social History Social History   Tobacco Use  . Smoking status: Never Smoker  . Smokeless tobacco: Never Used    Substance Use Topics  . Alcohol use: Not Currently    Comment: Occasional  . Drug use: No    Review of Systems  Constitutional: No fever/chills Eyes: No visual changes. ENT: No sore throat. Cardiovascular: Positive for chest pain. Respiratory: Denies shortness of breath. Gastrointestinal: No abdominal pain.  No nausea, no vomiting.  No diarrhea.  No constipation. Genitourinary: Negative for dysuria. Musculoskeletal: Negative for back pain. Skin: Negative for rash. Neurological: Negative for headaches, focal weakness or numbness.  ____________________________________________   PHYSICAL EXAM:  VITAL SIGNS: ED Triage Vitals  Enc Vitals Group     BP 08/30/19 0928 (!) 168/75     Pulse Rate 08/30/19 0928 87     Resp 08/30/19 0928 16     Temp 08/30/19 0928 98.5 F (36.9 C)     Temp Source 08/30/19 0928 Oral     SpO2 08/30/19 0928 98 %     Weight 08/30/19 0918 187 lb (84.8 kg)     Height 08/30/19 0918 5\' 5"  (1.651 m)     Head Circumference --      Peak Flow --      Pain Score 08/30/19 0918 8     Pain Loc --      Pain Edu? --      Excl. in [place]? --     Constitutional: Alert and oriented. Eyes: Conjunctivae are normal. Head: Atraumatic. Nose: No congestion/rhinnorhea. Mouth/Throat: Mucous membranes are moist. Neck: Normal ROM Cardiovascular: Normal rate, regular rhythm. Grossly normal heart sounds.  2+ radial pulses bilaterally. Respiratory: Normal respiratory effort.  No retractions. Lungs CTAB. Gastrointestinal: Soft and nontender. No distention. Genitourinary: deferred Musculoskeletal: No lower extremity tenderness nor edema. Neurologic:  Normal speech and language. No gross focal neurologic deficits are appreciated. Skin:  Skin is warm, dry and intact. No rash noted. Psychiatric: Mood and affect are normal. Speech and behavior are normal.  ____________________________________________   LABS (all labs ordered are listed, but only abnormal results are  displayed)  Labs Reviewed  BASIC METABOLIC PANEL - Abnormal; Notable for the following components:      Result Value   Glucose, Bld 137 (*)    All other components within normal limits  CBC - Abnormal; Notable for the following components:   Platelets 419 (*)    All other components within normal limits  TROPONIN I (HIGH SENSITIVITY)  TROPONIN I (HIGH SENSITIVITY)   ____________________________________________  EKG  ED ECG REPORT I, Blake Divine, the attending physician, personally viewed and interpreted this ECG.   Date: 08/30/2019  EKG Time: 9:31  Rate: 79  Rhythm: normal sinus rhythm  Axis: Normal  Intervals:none  ST&T Change: None   PROCEDURES  Procedure(s) performed (including Critical Care):  Procedures   ____________________________________________   INITIAL IMPRESSION / ASSESSMENT AND PLAN / ED COURSE       63 year old female with history of hypertension and diabetes presents to the ED complaining of intermittent chest pressure  for the past few days.  EKG shows no evidence of acute ischemia and initial troponin is negative.  Her symptoms sound fairly atypical for ACS and I doubt either dissection or PE.  Her chest x-ray is negative for acute process, no infectious sounding symptoms.  It is possible that her discomfort is related to recent changes in her medications and potential fluctuations in her blood pressure.  We will repeat second set troponin and if this is negative she would be appropriate for discharge home and follow-up with her PCP given her HEART score of 2.  Repeat troponin within normal limits, work-up unremarkable for concerning etiology of her chest pain.  Patient advised to closely follow-up with her PCP for any adjustments in her blood pressure medication and counseled to return to the ED for new worsening symptoms.  Patient agrees with plan.      ____________________________________________   FINAL CLINICAL IMPRESSION(S) / ED  DIAGNOSES  Final diagnoses:  Nonspecific chest pain  Essential hypertension     ED Discharge Orders    None       Note:  This document was prepared using Dragon voice recognition software and may include unintentional dictation errors.   Blake Divine, MD 08/30/19 1504

## 2019-08-30 NOTE — Telephone Encounter (Signed)
Had chest pain, left arm pain and had some dizziness/off feeling last night after taking new diltiazem 300mg . ER work up was negative. Will try one more dose tonight. If symptoms recur will change back to diltiazem xr 240mg . She agrees.

## 2019-08-30 NOTE — Telephone Encounter (Signed)
Copied from [place] [id number]. Topic: General - Other >> Aug 30, 2019  2:01 PM Yvette Rack wrote: Reason for CRM: Pt called and requested that Fenton Malling call her back. Cb# [phone]

## 2019-08-30 NOTE — ED Triage Notes (Signed)
Pt reports pressure like CP since her MD increased her diltiazem yesterday. Pt reports pain radiates into her left arm as well. Denies SOB.

## 2019-08-30 NOTE — Telephone Encounter (Signed)
Copied from [place] [phone]. Topic: General - Inquiry >> Aug 30, 2019  8:57 AM Virl Axe D wrote: Reason for CRM:  Pt stated she went to a UC because she thought she was having a heart attack and they are sending her to [facility]. She wanted to let [place] know. >> Aug 30, 2019 10:42 AM Scherrie Gerlach wrote: Son calling to advise pt in the ED now.  He does state pt had a med change last week, but not sure if he is trying to imply anything, but he said that a couple of time. [place] son [phone]   He asked if you could reach out to her.  He states pt has had a hard time this past week with her bp.  He states he is not blaming anyone, just wants to keep you informed.

## 2019-08-31 ENCOUNTER — Other Ambulatory Visit: Payer: Self-pay | Admitting: Physician Assistant

## 2019-08-31 DIAGNOSIS — I10 Essential (primary) hypertension: Secondary | ICD-10-CM

## 2019-08-31 MED ORDER — DILTIAZEM HCL ER COATED BEADS 240 MG PO CP24: 240.0000 mg | ORAL_CAPSULE | Freq: Every day | ORAL | 1 refills | Status: DC

## 2019-08-31 NOTE — Progress Notes (Signed)
Patient BP normalizing now on the 240mg . Continue Diltiazem 240mg  and losartan 100mg .

## 2019-10-03 ENCOUNTER — Telehealth: Payer: Self-pay | Admitting: Physician Assistant

## 2019-10-03 DIAGNOSIS — I10 Essential (primary) hypertension: Secondary | ICD-10-CM

## 2019-10-03 MED ORDER — DILTIAZEM HCL ER COATED BEADS 240 MG PO CP24: 240.0000 mg | ORAL_CAPSULE | Freq: Every day | ORAL | 1 refills | Status: DC

## 2019-10-03 NOTE — Telephone Encounter (Signed)
Medication Refill - Medication: diltiazem (CARTIA XT) 240 MG 24 hr capsule  Has the patient contacted their pharmacy? No - switching pharmacies, pt is down to 2 doses left (Agent: If no, request that the patient contact the pharmacy for the refill.) (Agent: If yes, when and what did the pharmacy advise?)  Preferred Pharmacy (with phone number or street name):  [facility], [place] Phone:  [phone]  Fax:  [phone]     Agent: Please be advised that RX refills may take up to 3 business days. We ask that you follow-up with your pharmacy.

## 2019-10-05 ENCOUNTER — Other Ambulatory Visit: Payer: Self-pay | Admitting: Physician Assistant

## 2019-10-05 DIAGNOSIS — I10 Essential (primary) hypertension: Secondary | ICD-10-CM

## 2019-10-05 MED ORDER — DILTIAZEM HCL ER COATED BEADS 240 MG PO CP24: 240.0000 mg | ORAL_CAPSULE | Freq: Every day | ORAL | 1 refills | Status: DC

## 2019-10-05 NOTE — Telephone Encounter (Signed)
Pt called stating that the pharmacy did not receive this request. Please advise.

## 2019-10-05 NOTE — Addendum Note (Signed)
Addended by: Kizzie Furnish on: 10/05/2019 06:44 PM   Modules accepted: Orders

## 2019-11-15 ENCOUNTER — Other Ambulatory Visit: Payer: Self-pay

## 2019-11-15 ENCOUNTER — Encounter: Payer: Self-pay | Admitting: Physician Assistant

## 2019-11-15 ENCOUNTER — Ambulatory Visit (INDEPENDENT_AMBULATORY_CARE_PROVIDER_SITE_OTHER): Payer: BC Managed Care – PPO | Admitting: Physician Assistant

## 2019-11-15 VITALS — BP 120/72 | HR 73 | Temp 98.6°F | Wt 187.2 lb

## 2019-11-15 DIAGNOSIS — M25551 Pain in right hip: Secondary | ICD-10-CM

## 2019-11-15 DIAGNOSIS — M5441 Lumbago with sciatica, right side: Secondary | ICD-10-CM

## 2019-11-15 MED ORDER — METHYLPREDNISOLONE 4 MG PO TBPK: ORAL_TABLET | ORAL | 0 refills | Status: DC

## 2019-11-15 MED ORDER — KETOROLAC TROMETHAMINE 60 MG/2ML IM SOLN
60.0000 mg | Freq: Once | INTRAMUSCULAR | Status: AC
  Administered 2019-11-15: 60 mg via INTRAMUSCULAR

## 2019-11-15 NOTE — Patient Instructions (Signed)
Low Back Sprain or Strain Rehab Ask your health care provider which exercises are safe for you. Do exercises exactly as told by your health care provider and adjust them as directed. It is normal to feel mild stretching, pulling, tightness, or discomfort as you do these exercises. Stop right away if you feel sudden pain or your pain gets worse. Do not begin these exercises until told by your health care provider. Stretching and range-of-motion exercises These exercises warm up your muscles and joints and improve the movement and flexibility of your back. These exercises also help to relieve pain, numbness, and tingling. Lumbar rotation  1. Lie on your back on a firm surface and bend your knees. 2. Straighten your arms out to your sides so each arm forms a 90-degree angle (right angle) with a side of your body. 3. Slowly move (rotate) both of your knees to one side of your body until you feel a stretch in your lower back (lumbar). Try not to let your shoulders lift off the floor. 4. Hold this position for __________ seconds. 5. Tense your abdominal muscles and slowly move your knees back to the starting position. 6. Repeat this exercise on the other side of your body. Repeat __________ times. Complete this exercise __________ times a day. Single knee to chest  1. Lie on your back on a firm surface with both legs straight. 2. Bend one of your knees. Use your hands to move your knee up toward your chest until you feel a gentle stretch in your lower back and buttock. ? Hold your leg in this position by holding on to the front of your knee. ? Keep your other leg as straight as possible. 3. Hold this position for __________ seconds. 4. Slowly return to the starting position. 5. Repeat with your other leg. Repeat __________ times. Complete this exercise __________ times a day. Prone extension on elbows  1. Lie on your abdomen on a firm surface (prone position). 2. Prop yourself up on your  elbows. 3. Use your arms to help lift your chest up until you feel a gentle stretch in your abdomen and your lower back. ? This will place some of your body weight on your elbows. If this is uncomfortable, try stacking pillows under your chest. ? Your hips should stay down, against the surface that you are lying on. Keep your hip and back muscles relaxed. 4. Hold this position for __________ seconds. 5. Slowly relax your upper body and return to the starting position. Repeat __________ times. Complete this exercise __________ times a day. Strengthening exercises These exercises build strength and endurance in your back. Endurance is the ability to use your muscles for a long time, even after they get tired. Pelvic tilt This exercise strengthens the muscles that lie deep in the abdomen. 1. Lie on your back on a firm surface. Bend your knees and keep your feet flat on the floor. 2. Tense your abdominal muscles. Tip your pelvis up toward the ceiling and flatten your lower back into the floor. ? To help with this exercise, you may place a small towel under your lower back and try to push your back into the towel. 3. Hold this position for __________ seconds. 4. Let your muscles relax completely before you repeat this exercise. Repeat __________ times. Complete this exercise __________ times a day. Alternating arm and leg raises  1. Get on your hands and knees on a firm surface. If you are on a hard floor, you   may want to use padding, such as an exercise mat, to cushion your knees. 2. Line up your arms and legs. Your hands should be directly below your shoulders, and your knees should be directly below your hips. 3. Lift your left leg behind you. At the same time, raise your right arm and straighten it in front of you. ? Do not lift your leg higher than your hip. ? Do not lift your arm higher than your shoulder. ? Keep your abdominal and back muscles tight. ? Keep your hips facing the  ground. ? Do not arch your back. ? Keep your balance carefully, and do not hold your breath. 4. Hold this position for __________ seconds. 5. Slowly return to the starting position. 6. Repeat with your right leg and your left arm. Repeat __________ times. Complete this exercise __________ times a day. Abdominal set with straight leg raise  1. Lie on your back on a firm surface. 2. Bend one of your knees and keep your other leg straight. 3. Tense your abdominal muscles and lift your straight leg up, 4-6 inches (10-15 cm) off the ground. 4. Keep your abdominal muscles tight and hold this position for __________ seconds. ? Do not hold your breath. ? Do not arch your back. Keep it flat against the ground. 5. Keep your abdominal muscles tense as you slowly lower your leg back to the starting position. 6. Repeat with your other leg. Repeat __________ times. Complete this exercise __________ times a day. Single leg lower with bent knees 1. Lie on your back on a firm surface. 2. Tense your abdominal muscles and lift your feet off the floor, one foot at a time, so your knees and hips are bent in 90-degree angles (right angles). ? Your knees should be over your hips and your lower legs should be parallel to the floor. 3. Keeping your abdominal muscles tense and your knee bent, slowly lower one of your legs so your toe touches the ground. 4. Lift your leg back up to return to the starting position. ? Do not hold your breath. ? Do not let your back arch. Keep your back flat against the ground. 5. Repeat with your other leg. Repeat __________ times. Complete this exercise __________ times a day. Posture and body mechanics Good posture and healthy body mechanics can help to relieve stress in your body's tissues and joints. Body mechanics refers to the movements and positions of your body while you do your daily activities. Posture is part of body mechanics. Good posture means:  Your spine is in its  natural S-curve position (neutral).  Your shoulders are pulled back slightly.  Your head is not tipped forward. Follow these guidelines to improve your posture and body mechanics in your everyday activities. Standing   When standing, keep your spine neutral and your feet about hip width apart. Keep a slight bend in your knees. Your ears, shoulders, and hips should line up.  When you do a task in which you stand in one place for a long time, place one foot up on a stable object that is 2-4 inches (5-10 cm) high, such as a footstool. This helps keep your spine neutral. Sitting   When sitting, keep your spine neutral and keep your feet flat on the floor. Use a footrest, if necessary, and keep your thighs parallel to the floor. Avoid rounding your shoulders, and avoid tilting your head forward.  When working at a desk or a computer, keep your desk   at a height where your hands are slightly lower than your elbows. Slide your chair under your desk so you are close enough to maintain good posture.  When working at a computer, place your monitor at a height where you are looking straight ahead and you do not have to tilt your head forward or downward to look at the screen. Resting  When lying down and resting, avoid positions that are most painful for you.  If you have pain with activities such as sitting, bending, stooping, or squatting, lie in a position in which your body does not bend very much. For example, avoid curling up on your side with your arms and knees near your chest (fetal position).  If you have pain with activities such as standing for a long time or reaching with your arms, lie with your spine in a neutral position and bend your knees slightly. Try the following positions: ? Lying on your side with a pillow between your knees. ? Lying on your back with a pillow under your knees. Lifting   When lifting objects, keep your feet at least shoulder width apart and tighten your  abdominal muscles.  Bend your knees and hips and keep your spine neutral. It is important to lift using the strength of your legs, not your back. Do not lock your knees straight out.  Always ask for help to lift heavy or awkward objects. This information is not intended to replace advice given to you by your health care provider. Make sure you discuss any questions you have with your health care provider. Document Revised: 12/22/2018 Document Reviewed: 09/21/2018 Elsevier Patient Education  2020 [place].   Hip Bursitis Rehab Ask your health care provider which exercises are safe for you. Do exercises exactly as told by your health care provider and adjust them as directed. It is normal to feel mild stretching, pulling, tightness, or discomfort as you do these exercises. Stop right away if you feel sudden pain or your pain gets worse. Do not begin these exercises until told by your health care provider. Stretching exercise This exercise warms up your muscles and joints and improves the movement and flexibility of your hip. This exercise also helps to relieve pain and stiffness. Iliotibial band stretch An iliotibial band is a strong band of muscle tissue that runs from the outer side of your hip to the outer side of your thigh and knee. 1. Lie on your side with your left / right leg in the top position. 2. Bend your left / right knee and grab your ankle. Stretch out your bottom arm to help you balance. 3. Slowly bring your knee back so your thigh is behind your body. 4. Slowly lower your knee toward the floor until you feel a gentle stretch on the outside of your left / right thigh. If you do not feel a stretch and your knee will not fall farther, place the heel of your other foot on top of your knee and pull your knee down toward the floor with your foot. 5. Hold this position for __________ seconds. 6. Slowly return to the starting position. Repeat __________ times. Complete this exercise  __________ times a day. Strengthening exercises These exercises build strength and endurance in your hip and pelvis. Endurance is the ability to use your muscles for a long time, even after they get tired. Bridge This exercise strengthens the muscles that move your thigh backward (hip extensors). 1. Lie on your back on a firm  surface with your knees bent and your feet flat on the floor. 2. Tighten your buttocks muscles and lift your buttocks off the floor until your trunk is level with your thighs. ? Do not arch your back. ? You should feel the muscles working in your buttocks and the back of your thighs. If you do not feel these muscles, slide your feet 1-2 inches (2.5-5 cm) farther away from your buttocks. ? If this exercise is too easy, try doing it with your arms crossed over your chest. 3. Hold this position for __________ seconds. 4. Slowly lower your hips to the starting position. 5. Let your muscles relax completely after each repetition. Repeat __________ times. Complete this exercise __________ times a day. Squats This exercise strengthens the muscles in front of your thigh and knee (quadriceps). 1. Stand in front of a table, with your feet and knees pointing straight ahead. You may rest your hands on the table for balance but not for support. 2. Slowly bend your knees and lower your hips like you are going to sit in a chair. ? Keep your weight over your heels, not over your toes. ? Keep your lower legs upright so they are parallel with the table legs. ? Do not let your hips go lower than your knees. ? Do not bend lower than told by your health care provider. ? If your hip pain increases, do not bend as low. 3. Hold the squat position for __________ seconds. 4. Slowly push with your legs to return to standing. Do not use your hands to pull yourself to standing. Repeat __________ times. Complete this exercise __________ times a day. Hip hike 1. Stand sideways on a bottom step.  Stand on your left / right leg with your other foot unsupported next to the step. You can hold on to the railing or wall for balance if needed. 2. Keep your knees straight and your torso square. Then lift your left / right hip up toward the ceiling. 3. Hold this position for __________ seconds. 4. Slowly let your left / right hip lower toward the floor, past the starting position. Your foot should get closer to the floor. Do not lean or bend your knees. Repeat __________ times. Complete this exercise __________ times a day. Single leg stand 1. Without shoes, stand near a railing or in a doorway. You may hold on to the railing or door frame as needed for balance. 2. Squeeze your left / right buttock muscles, then lift up your other foot. ? Do not let your left / right hip push out to the side. ? It is helpful to stand in front of a mirror for this exercise so you can watch your hip. 3. Hold this position for __________ seconds. Repeat __________ times. Complete this exercise __________ times a day. This information is not intended to replace advice given to you by your health care provider. Make sure you discuss any questions you have with your health care provider. Document Revised: 12/25/2018 Document Reviewed: 12/25/2018 Elsevier Patient Education  [place].

## 2019-11-15 NOTE — Progress Notes (Signed)
Patient: Molly Potter Female    DOB: 08-24-1956   64 y.o.   MRN: [id number] Visit Date: 11/15/2019  Today's Provider: Mar Daring, PA-C   Chief Complaint  Patient presents with  . Hip Pain  . Knee Pain   Subjective:   Patient states that the pain starts at the hip and goes down to the knee. She states has had the pain previously but has gotten worse. The current pain has been more than a week but less than a year.   Hip Pain  The pain is present in the right hip and right leg. The quality of the pain is described as aching, burning, cramping, shooting and stabbing. The pain is severe (ocassionally). The pain has been constant (previously would come and go but now is constant) since onset. Pertinent negatives include no numbness. She has tried acetaminophen, elevation, ice and rest for the symptoms. The treatment provided mild relief.  Knee Pain  The pain is present in the right knee. The quality of the pain is described as aching, burning, cramping, shooting and stabbing. Pertinent negatives include no numbness.   [facility] Allergies  Allergen Reactions  . Effexor [Venlafaxine] Anaphylaxis  . Diazepam     Other reaction(s): Unknown  . Synvisc [Hylan G-F 20] Other (See Comments)    Myalgias, hand pain, back pain; Benadryl helped pain  . Tramadol Nausea Only     Current Outpatient Medications:  .  acetaminophen (TYLENOL) 500 MG tablet, Take 1,000 mg by mouth every 6 (six) hours as needed., Disp: , Rfl:  .  Ascorbic Acid (VITAMIN C ER PO), Take by mouth., Disp: , Rfl:  .  cyclobenzaprine (FLEXERIL) 5 MG tablet, TAKE 1 TABLET(5 MG) BY MOUTH AT BEDTIME, Disp: 90 tablet, Rfl: 1 .  diltiazem (CARTIA XT) 240 MG 24 hr capsule, Take 1 capsule (240 mg total) by mouth daily., Disp: 90 capsule, Rfl: 1 .  glucose blood (CONTOUR NEXT TEST) test strip, To check BG BID, Disp: 200 each, Rfl: 3 .  losartan (COZAAR) 100 MG tablet, Take 1 tablet (100 mg total) by mouth daily., Disp: 90  tablet, Rfl: 3 .  loteprednol (LOTEMAX) 0.5 % ophthalmic suspension, INSTILL 1 DROP INTO OU QID FOR 1 WEEK THEN BID FOR 1 WEEK, Disp: , Rfl:  .  meloxicam (MOBIC) 15 MG tablet, , Disp: , Rfl: 1 .  Microlet Lancets MISC, To check BG BID, Disp: 200 each, Rfl: 3 .  Multiple Vitamin (MULTIVITAMIN) tablet, Take 1 tablet by mouth daily., Disp: , Rfl:  .  Olopatadine HCl 0.2 % SOLN, Place 1 drop into both eyes daily as needed., Disp: , Rfl:  .  TURMERIC CURCUMIN PO, Take 1 tablet by mouth daily., Disp: , Rfl:   Review of Systems  Constitutional: Negative.   Respiratory: Negative.   Cardiovascular: Negative.   Musculoskeletal: Positive for arthralgias, back pain, gait problem and myalgias.  Neurological: Negative for weakness and numbness.    Social History   Tobacco Use  . Smoking status: Never Smoker  . Smokeless tobacco: Never Used  Substance Use Topics  . Alcohol use: Not Currently    Comment: Occasional      Objective:   BP 120/72 (BP Location: Left Arm, Patient Position: Sitting, Cuff Size: Large)   Pulse 73   Temp 98.6 F (37 C) (Oral)   Wt 187 lb 3.2 oz (84.9 kg)   SpO2 97%   BMI 31.15 kg/m  Vitals:  11/15/19 1654  BP: 120/72  Pulse: 73  Temp: 98.6 F (37 C)  TempSrc: Oral  SpO2: 97%  Weight: 187 lb 3.2 oz (84.9 kg)  Body mass index is 31.15 kg/m.   Physical Exam Vitals reviewed.  Constitutional:      General: She is not in acute distress.    Appearance: Normal appearance. She is well-developed. She is not diaphoretic.  Neck:     Thyroid: No thyromegaly.     Vascular: No JVD.     Trachea: No tracheal deviation.  Cardiovascular:     Rate and Rhythm: Normal rate and regular rhythm.     Heart sounds: Normal heart sounds. No murmur. No friction rub. No gallop.   Pulmonary:     Effort: Pulmonary effort is normal. No respiratory distress.     Breath sounds: Normal breath sounds. No wheezing or rales.  Musculoskeletal:        General: Normal range of motion.      Cervical back: Normal range of motion and neck supple.     Lumbar back: No spasms, tenderness or bony tenderness. Normal range of motion. Negative right straight leg raise test and negative left straight leg raise test.     Right hip: Tenderness (through glute and over IT band) and bony tenderness (over greater trochanter) present. No deformity. Normal range of motion. Normal strength.     Left hip: Normal.  Lymphadenopathy:     Cervical: No cervical adenopathy.  Neurological:     Mental Status: She is alert.      No results found for any visits on 11/15/19.     Assessment & Plan    1. Right hip pain Will get imaging as below. Toradol shot given in the office for pain relief. Start medrol dose pak tomorrow. Exercises and stretches printed. Epsom salt soaks can help. Warm compresses/heating pad. Call if worsening. - DG Hip Unilat W OR W/O Pelvis 2-3 Views Right; Future - DG Lumbar Spine Complete; Future - ketorolac (TORADOL) injection 60 mg - methylPREDNISolone (MEDROL) 4 MG TBPK tablet; 6 day taper; take as directed on package instructions  Dispense: 21 tablet; Refill: 0  2. Acute right-sided low back pain with right-sided sciatica See above medical treatment plan. - DG Hip Unilat W OR W/O Pelvis 2-3 Views Right; Future - DG Lumbar Spine Complete; Future - ketorolac (TORADOL) injection 60 mg - methylPREDNISolone (MEDROL) 4 MG TBPK tablet; 6 day taper; take as directed on package instructions  Dispense: 21 tablet; Refill: 0     Mar Daring, PA-C  [facility]

## 2019-11-16 ENCOUNTER — Ambulatory Visit
Admission: RE | Admit: 2019-11-16 | Discharge: 2019-11-16 | Disposition: A | Discharge status: Home / Self Care | Payer: BC Managed Care – PPO | Source: Ambulatory Visit | Attending: Physician Assistant | Admitting: Physician Assistant

## 2019-11-16 ENCOUNTER — Ambulatory Visit
Admission: RE | Admit: 2019-11-16 | Discharge: 2019-11-16 | Disposition: A | Discharge status: Home / Self Care | Payer: BC Managed Care – PPO | Attending: Physician Assistant | Admitting: Physician Assistant

## 2019-11-16 DIAGNOSIS — M25551 Pain in right hip: Secondary | ICD-10-CM

## 2019-11-16 DIAGNOSIS — M1611 Unilateral primary osteoarthritis, right hip: Secondary | ICD-10-CM | POA: Diagnosis not present

## 2019-11-16 DIAGNOSIS — M5441 Lumbago with sciatica, right side: Secondary | ICD-10-CM

## 2019-11-16 DIAGNOSIS — M545 Low back pain: Secondary | ICD-10-CM | POA: Diagnosis not present

## 2019-11-19 ENCOUNTER — Telehealth: Payer: Self-pay | Admitting: Physician Assistant

## 2019-11-19 ENCOUNTER — Telehealth: Payer: Self-pay

## 2019-11-19 DIAGNOSIS — M7631 Iliotibial band syndrome, right leg: Secondary | ICD-10-CM

## 2019-11-19 DIAGNOSIS — M7061 Trochanteric bursitis, right hip: Secondary | ICD-10-CM

## 2019-11-19 DIAGNOSIS — M5136 Other intervertebral disc degeneration, lumbar region: Secondary | ICD-10-CM

## 2019-11-19 DIAGNOSIS — M5137 Other intervertebral disc degeneration, lumbosacral region: Secondary | ICD-10-CM

## 2019-11-19 MED ORDER — PREDNISONE 20 MG PO TABS: 20.0000 mg | ORAL_TABLET | Freq: Two times a day (BID) | ORAL | 0 refills | Status: DC

## 2019-11-19 NOTE — Telephone Encounter (Signed)
Referral placed.

## 2019-11-19 NOTE — Telephone Encounter (Signed)
Can try prednisone. It is same class but different medication. If symptoms recur. Stop and call.

## 2019-11-19 NOTE — Telephone Encounter (Signed)
-----   Message from Mar Daring, [place] sent at 11/19/2019  7:38 AM EST ----- There is moderate degenerative changes noted in the lumbar spine (low back). I think this may contribute to a lot of your pain and symptoms. May benefit from either trying PT first or consider referral to an orthopedic spine specialist.

## 2019-11-19 NOTE — Telephone Encounter (Signed)
-----   Message from Mar Daring, [place] sent at 11/19/2019  7:36 AM EST ----- There are mild degenerative (arthritic) changes to the right hip noted. No other bony abnormality.

## 2019-11-19 NOTE — Telephone Encounter (Signed)
Patient advised as directed below. She wants the referral to the orthopedic spine specilist

## 2019-11-19 NOTE — Telephone Encounter (Signed)
Patient advised and verbalized understanding 

## 2019-11-19 NOTE — Telephone Encounter (Signed)
Patient called in stating that the methylPREDNISolone (MEDROL) 4 MG TBPK tablet was causing her to have headaches, chest pain, and nausea. Patient is no longer taking medication as of yesterday morning and symptoms have gone away fully, except for headache. Patient is requesting an alternative, as she did find it to be helping her legs, but did not feel safe continuing. Please advise. Patient is also requesting a call, instead of mychart message, as her Deloris Ping is not working again.

## 2019-12-12 ENCOUNTER — Other Ambulatory Visit: Payer: Self-pay | Admitting: Physician Assistant

## 2019-12-12 DIAGNOSIS — M5412 Radiculopathy, cervical region: Secondary | ICD-10-CM

## 2019-12-12 NOTE — Telephone Encounter (Signed)
LOV  11/15/19  LRF  06/15/19  #90 x 1

## 2019-12-13 DIAGNOSIS — Z23 Encounter for immunization: Secondary | ICD-10-CM | POA: Diagnosis not present

## 2020-01-03 DIAGNOSIS — M5136 Other intervertebral disc degeneration, lumbar region: Secondary | ICD-10-CM | POA: Diagnosis not present

## 2020-01-03 DIAGNOSIS — M48062 Spinal stenosis, lumbar region with neurogenic claudication: Secondary | ICD-10-CM | POA: Diagnosis not present

## 2020-01-10 DIAGNOSIS — Z23 Encounter for immunization: Secondary | ICD-10-CM | POA: Diagnosis not present

## 2020-03-21 ENCOUNTER — Other Ambulatory Visit: Payer: Self-pay | Admitting: Physician Assistant

## 2020-03-21 DIAGNOSIS — I10 Essential (primary) hypertension: Secondary | ICD-10-CM

## 2020-03-24 ENCOUNTER — Other Ambulatory Visit: Payer: Self-pay | Admitting: Physician Assistant

## 2020-03-24 DIAGNOSIS — I10 Essential (primary) hypertension: Secondary | ICD-10-CM

## 2020-03-24 NOTE — Telephone Encounter (Signed)
Requested Prescriptions  Pending Prescriptions Disp Refills   diltiazem (CARDIZEM CD) 240 MG 24 hr capsule [Pharmacy Med Name: dilTIAZem HCl ER Coated Beads 240 MG Oral Capsule Extended Release 24 Hour] 90 capsule 0    Sig: Take 1 capsule by mouth once daily     Cardiovascular:  Calcium Channel Blockers Passed - 03/24/2020  8:23 PM      Passed - Last BP in normal range    BP Readings from Last 1 Encounters:  11/15/19 120/72         Passed - Valid encounter within last 6 months    Recent Outpatient Visits          4 months ago Right hip pain   [facility] [place], [place], [place]   7 months ago Essential hypertension   [place], [place], [place]   7 months ago Essential hypertension   [place], Clearnce Sor[place], [place]   8 months ago Annual physical exam   [place], [place]   1 year ago Elevated hemoglobin [facility])   [facility], [place], [place]

## 2020-03-26 ENCOUNTER — Other Ambulatory Visit: Payer: Self-pay | Admitting: Physician Assistant

## 2020-03-26 DIAGNOSIS — I10 Essential (primary) hypertension: Secondary | ICD-10-CM

## 2020-04-20 DIAGNOSIS — J029 Acute pharyngitis, unspecified: Secondary | ICD-10-CM | POA: Diagnosis not present

## 2020-04-20 DIAGNOSIS — R0981 Nasal congestion: Secondary | ICD-10-CM | POA: Diagnosis not present

## 2020-05-05 ENCOUNTER — Telehealth: Payer: Self-pay

## 2020-05-05 NOTE — Telephone Encounter (Signed)
She can try Coricidin HBP and flonase OTC

## 2020-05-05 NOTE — Telephone Encounter (Signed)
Pt states she has been using Coricidin and Flonase.  I schedule her for a virtual visit tomorrow at 1pm  (05/06/2020)  Thanks,   -Mickel Baas

## 2020-05-05 NOTE — Telephone Encounter (Signed)
Copied from [place] [id number]. Topic: General - Other >> May 05, 2020  9:15 AM Hinda Lenis D wrote: Fluid on her ear needs some meds, decline appt / please advise

## 2020-05-06 ENCOUNTER — Telehealth (INDEPENDENT_AMBULATORY_CARE_PROVIDER_SITE_OTHER): Payer: BC Managed Care – PPO | Admitting: Family Medicine

## 2020-05-06 ENCOUNTER — Encounter: Payer: Self-pay | Admitting: Family Medicine

## 2020-05-06 ENCOUNTER — Other Ambulatory Visit: Payer: Self-pay

## 2020-05-06 DIAGNOSIS — H66001 Acute suppurative otitis media without spontaneous rupture of ear drum, right ear: Secondary | ICD-10-CM

## 2020-05-06 DIAGNOSIS — J069 Acute upper respiratory infection, unspecified: Secondary | ICD-10-CM

## 2020-05-06 DIAGNOSIS — J301 Allergic rhinitis due to pollen: Secondary | ICD-10-CM

## 2020-05-06 MED ORDER — AMOXICILLIN 875 MG PO TABS: 875.0000 mg | ORAL_TABLET | Freq: Two times a day (BID) | ORAL | 0 refills | Status: DC

## 2020-05-06 MED ORDER — MONTELUKAST SODIUM 10 MG PO TABS: 10.0000 mg | ORAL_TABLET | Freq: Every day | ORAL | 1 refills | Status: DC

## 2020-05-06 NOTE — Progress Notes (Signed)
MyChart Video Visit    Virtual Visit via Video Note   This visit type was conducted due to national recommendations for restrictions regarding the COVID-19 Pandemic (e.g. social distancing) in an effort to limit this patient's exposure and mitigate transmission in our community. This patient is at least at moderate risk for complications without adequate follow up. This format is felt to be most appropriate for this patient at this time. Physical exam was limited by quality of the video and audio technology used for the visit.   Patient location: home Provider location: bfp   Patient: Molly Potter   DOB: 07-01-1956   64 y.o. Female  MRN: [id number] Visit Date: 05/06/2020  Today's healthcare provider: Lelon Huh, MD   Chief Complaint  Patient presents with  . Sore Throat   Subjective    Sore Throat  This is a new problem. Episode onset: 2.5 weeks ago. The problem has been waxing and waning. There has been no fever. Associated symptoms include ear pain (right ear pain) and headaches. Pertinent negatives include no abdominal pain, coughing, shortness of breath or vomiting. She has tried gargles (Coricidin  HBP) for the symptoms. The treatment provided mild relief.    Patient was seen at [facility] urgent care on 04/20/2020. She was tested for COVID and strep, and both test were negative. Patient was advised to try warm salt water gargles. Symptoms improved for a few days, then flared back up.    Started having right ear pain since Sunday.    Medications: Outpatient Medications Prior to Visit  Medication Sig  . acetaminophen (TYLENOL) 500 MG tablet Take 1,000 mg by mouth every 6 (six) hours as needed.  . cyclobenzaprine (FLEXERIL) 5 MG tablet TAKE 1 TABLET(5 MG) BY MOUTH AT BEDTIME  . diltiazem (CARDIZEM CD) 240 MG 24 hr capsule Take 1 capsule by mouth once daily  . glucose blood (CONTOUR NEXT TEST) test strip To check BG BID  . losartan (COZAAR) 100 MG tablet Take 1 tablet  (100 mg total) by mouth daily.  . meloxicam (MOBIC) 15 MG tablet   . Microlet Lancets MISC To check BG BID  . Multiple Vitamin (MULTIVITAMIN) tablet Take 1 tablet by mouth daily.  Vladimir Faster Glycol-Propyl Glycol (SYSTANE OP) Apply to eye.  . Ascorbic Acid (VITAMIN C ER PO) Take by mouth. (Patient not taking: Reported on 05/06/2020)  . [DISCONTINUED] loteprednol (LOTEMAX) 0.5 % ophthalmic suspension INSTILL 1 DROP INTO OU QID FOR 1 WEEK THEN BID FOR 1 WEEK (Patient not taking: Reported on 05/06/2020)  . [DISCONTINUED] Olopatadine HCl 0.2 % SOLN Place 1 drop into both eyes daily as needed.  . [DISCONTINUED] predniSONE (DELTASONE) 20 MG tablet Take 1 tablet (20 mg total) by mouth 2 (two) times daily with a meal. (Patient not taking: Reported on 05/06/2020)  . [DISCONTINUED] TURMERIC CURCUMIN PO Take 1 tablet by mouth daily. (Patient not taking: Reported on 05/06/2020)   No facility-administered medications prior to visit.    Review of Systems  Constitutional: Negative for appetite change, chills, fatigue and fever.  HENT: Positive for ear pain (right ear pain) and postnasal drip.   Eyes: Positive for itching.  Respiratory: Negative for cough, chest tightness and shortness of breath.   Cardiovascular: Negative for chest pain and palpitations.  Gastrointestinal: Negative for abdominal pain, nausea and vomiting.  Neurological: Positive for headaches. Negative for dizziness and weakness.      Objective    There were no vitals taken for this visit.  Physical Exam   Awake, alert, oriented x 3. In no apparent distress   Assessment & Plan     1. Upper respiratory tract infection, unspecified type  - amoxicillin (AMOXIL) 875 MG tablet; Take 1 tablet (875 mg total) by mouth 2 (two) times daily.  Dispense: 20 tablet; Refill: 0  2. Non-recurrent acute suppurative otitis media of right ear without spontaneous rupture of tympanic membrane  - amoxicillin (AMOXIL) 875 MG tablet; Take 1 tablet  (875 mg total) by mouth 2 (two) times daily.  Dispense: 20 tablet; Refill: 0  3. Seasonal allergic rhinitis due to pollen Not controlled with fluticasone nasal spray. Add- montelukast (SINGULAIR) 10 MG tablet; Take 1 tablet (10 mg total) by mouth at bedtime. For allergies  Dispense: 30 tablet; Refill: 1     I discussed the assessment and treatment plan with the patient. The patient was provided an opportunity to ask questions and all were answered. The patient agreed with the plan and demonstrated an understanding of the instructions.   The patient was advised to call back or seek an in-person evaluation if the symptoms worsen or if the condition fails to improve as anticipated.  I provided 12 minutes of non-face-to-face time during this encounter.  The entirety of the information documented in the History of Present Illness, Review of Systems and Physical Exam were personally obtained by me. Portions of this information were initially documented by the CMA and reviewed by me for thoroughness and accuracy.     I discussed the limitations of evaluation and management by telemedicine and the availability of in person appointments. The patient expressed understanding and agreed to proceed.   Lelon Huh, MD [facility] [phone] (phone) [phone] (fax)  [place]

## 2020-06-22 ENCOUNTER — Other Ambulatory Visit: Payer: Self-pay | Admitting: Physician Assistant

## 2020-06-22 DIAGNOSIS — E119 Type 2 diabetes mellitus without complications: Secondary | ICD-10-CM

## 2020-06-22 DIAGNOSIS — I10 Essential (primary) hypertension: Secondary | ICD-10-CM

## 2020-06-22 NOTE — Telephone Encounter (Signed)
Requested Prescriptions  Pending Prescriptions Disp Refills   diltiazem (CARDIZEM CD) 240 MG 24 hr capsule [Pharmacy Med Name: dilTIAZem HCl ER Coated Beads 240 MG Oral Capsule Extended Release 24 Hour] 90 capsule 1    Sig: Take 1 capsule by mouth once daily     Cardiovascular:  Calcium Channel Blockers Passed - 06/22/2020  6:30 AM      Passed - Last BP in normal range    BP Readings from Last 1 Encounters:  11/15/19 120/72         Passed - Valid encounter within last 6 months    Recent Outpatient Visits          1 month ago Upper respiratory tract infection, unspecified type   [facility] Birdie Sons, MD   7 months ago Right hip pain   [facility] Fenton Malling M, [place]   10 months ago Essential hypertension   [facility] Fenton Malling M, [place]   10 months ago Essential hypertension   [facility] Fenton Malling M, [place]   11 months ago Annual physical exam   [employer], Clearnce Sorrel, [place]      Future Appointments            In 4 weeks Burnette, Clearnce Sorrel, PA-C [facility], [facility]            losartan (COZAAR) 100 MG tablet [place] Med Name: Losartan Potassium 100 MG Oral Tablet] 90 tablet 0    Sig: Take 1 tablet by mouth once daily     Cardiovascular:  Angiotensin Receptor Blockers Failed - 06/22/2020  6:30 AM      Failed - Cr in normal range and within 180 days    Creat  Date Value Ref Range Status  06/15/2017 0.72 0.50 - 0.99 mg/dL Final    Comment:    For patients >4 years of age, the reference limit for Creatinine is approximately 13% higher for people identified as African-American. .    Creatinine, Ser  Date Value Ref Range Status  08/30/2019 0.68 0.44 - 1.00 mg/dL Final         Failed - K in normal range and within 180 days    Potassium  Date Value Ref Range Status  08/30/2019 4.2 3.5 - 5.1 mmol/L Final  12/01/2013 3.3 (L) 3.5 - 5.1  mmol/L Final         Passed - Patient is not pregnant      Passed - Last BP in normal range    BP Readings from Last 1 Encounters:  11/15/19 120/72         Passed - Valid encounter within last 6 months    Recent Outpatient Visits          1 month ago Upper respiratory tract infection, unspecified type   [facility] Birdie Sons, MD   7 months ago Right hip pain   [facility] Fenton Malling M, [place]   10 months ago Essential hypertension   [facility] Fenton Malling M, [place]   10 months ago Essential hypertension   [facility] Mar Daring, [place]   11 months ago Annual physical exam   [facility], Clearnce Sorrel, [place]      Future Appointments            In 4 weeks Marlyn Corporal, Clearnce Sorrel, PA-C [employer], [facility]

## 2020-06-22 NOTE — Telephone Encounter (Signed)
Requested medications are due for refill today?  Yes   Requested medications are on active medication list? Yes  Last Refill:   06/29/2019  # 90 with 3 refills   Future visit scheduled?  Yes  Notes to Clinic:  Medication failed Rx refill protocol due to no labs within past 180 days.  Last labs were performed on 08/30/2019.

## 2020-07-21 ENCOUNTER — Encounter: Payer: Self-pay | Admitting: Physician Assistant

## 2020-07-21 ENCOUNTER — Ambulatory Visit (INDEPENDENT_AMBULATORY_CARE_PROVIDER_SITE_OTHER): Payer: BC Managed Care – PPO | Admitting: Physician Assistant

## 2020-07-21 ENCOUNTER — Other Ambulatory Visit: Payer: Self-pay

## 2020-07-21 VITALS — BP 121/69 | HR 78 | Resp 16 | Ht 65.5 in | Wt 190.7 lb

## 2020-07-21 DIAGNOSIS — E0590 Thyrotoxicosis, unspecified without thyrotoxic crisis or storm: Secondary | ICD-10-CM

## 2020-07-21 DIAGNOSIS — E119 Type 2 diabetes mellitus without complications: Secondary | ICD-10-CM

## 2020-07-21 DIAGNOSIS — I10 Essential (primary) hypertension: Secondary | ICD-10-CM

## 2020-07-21 DIAGNOSIS — E782 Mixed hyperlipidemia: Secondary | ICD-10-CM

## 2020-07-21 DIAGNOSIS — Z6831 Body mass index (BMI) 31.0-31.9, adult: Secondary | ICD-10-CM

## 2020-07-21 DIAGNOSIS — M5416 Radiculopathy, lumbar region: Secondary | ICD-10-CM | POA: Insufficient documentation

## 2020-07-21 DIAGNOSIS — Z5329 Procedure and treatment not carried out because of patient's decision for other reasons: Secondary | ICD-10-CM

## 2020-07-21 DIAGNOSIS — Z0000 Encounter for general adult medical examination without abnormal findings: Secondary | ICD-10-CM | POA: Diagnosis not present

## 2020-07-21 DIAGNOSIS — E6609 Other obesity due to excess calories: Secondary | ICD-10-CM

## 2020-07-21 DIAGNOSIS — Z5320 Procedure and treatment not carried out because of patient's decision for unspecified reasons: Secondary | ICD-10-CM

## 2020-07-21 DIAGNOSIS — Z1239 Encounter for other screening for malignant neoplasm of breast: Secondary | ICD-10-CM

## 2020-07-21 NOTE — Progress Notes (Signed)
Complete physical exam   Patient: Molly Potter   DOB: Sep 02, 1956   64 y.o. Female  MRN: [id number] Visit Date: 07/21/2020  Today's healthcare provider: Mar Daring, PA-C   Chief Complaint  Patient presents with  . Annual Exam   Subjective    Emmaline B Kozlov is a 64 y.o. female who presents today for a complete physical exam.  She reports consuming a general diet. Home exercise routine includes walks 2-3 times a week . She generally feels well. She reports sleeping poorly. She does have additional problems to discuss today. She is having some tingling. R leg since last Tuesday. HPI  06/20/18-Pap-repeat in 3 yrs 07/05/18-Cologuard-Repeat in 3 yrs Eye Exam:04/2020-[facility]   Past Medical History:  Diagnosis Date  . Arthritis   . Fatigue   . History of eczema   . Hypertension   . Hyperthyroidism   . Joint pain    Past Surgical History:  Procedure Laterality Date  . ROTATOR CUFF REPAIR Right   . TUBAL LIGATION     Social History   Socioeconomic History  . Marital status: Married    Spouse name: Not on file  . Number of children: 4  . Years of education: GED  . Highest education level: Not on file  Occupational History    Employer: [employer]  Tobacco Use  . Smoking status: Never Smoker  . Smokeless tobacco: Never Used  Vaping Use  . Vaping Use: Never used  Substance and Sexual Activity  . Alcohol use: Not Currently    Comment: Occasional  . Drug use: No  . Sexual activity: Not on file  Other Topics Concern  . Not on file  Social History Narrative  . Not on file   Social Determinants of Health   Financial Resource Strain:   . Difficulty of Paying Living Expenses: Not on file  Food Insecurity:   . Worried About Charity fundraiser in the Last Year: Not on file  . Ran Out of Food in the Last Year: Not on file  Transportation Needs:   . Lack of Transportation (Medical): Not on file  . Lack of Transportation (Non-Medical): Not on file   Physical Activity:   . Days of Exercise per Week: Not on file  . Minutes of Exercise per Session: Not on file  Stress:   . Feeling of Stress : Not on file  Social Connections:   . Frequency of Communication with Friends and Family: Not on file  . Frequency of Social Gatherings with Friends and Family: Not on file  . Attends Religious Services: Not on file  . Active Member of Clubs or Organizations: Not on file  . Attends Archivist Meetings: Not on file  . Marital Status: Not on file  Intimate Partner Violence:   . Fear of Current or Ex-Partner: Not on file  . Emotionally Abused: Not on file  . Physically Abused: Not on file  . Sexually Abused: Not on file   Family Status  Relation Name Status  . Mother  Deceased  . Father  Deceased  . Sister  Alive  . Brother  Alive  . MGM  Deceased  . PGF  Deceased  . Sister  Alive  . Sister  Alive  . Sister  Alive  . Brother  Alive  . Brother  Alive   Family History  Problem Relation Age of Onset  . CAD Mother   . Throat cancer Mother   .  Hypertension Father   . Lung cancer Father   . Diabetes Maternal Grandmother   . Diabetes Paternal Grandfather    Allergies  Allergen Reactions  . Effexor [Venlafaxine] Anaphylaxis  . Hylan G-F 20 Other (See Comments)    Myalgias, hand pain, back pain; Benadryl helped pain Other reaction(s): Other Myalgias, hand pain, back pain; Benadryl helped pain Burning sensation all over body  . Diazepam     Other reaction(s): Unknown  . Tramadol Nausea Only  . Methylprednisolone Nausea Only and Other (See Comments)    Headaches and chest pain    Patient Care Team: Mar Daring, PA-C as PCP - General (Family Medicine)   Medications: Outpatient Medications Prior to Visit  Medication Sig  . acetaminophen (TYLENOL) 500 MG tablet Take 1,000 mg by mouth every 6 (six) hours as needed.  Marland Kitchen amoxicillin (AMOXIL) 875 MG tablet Take 1 tablet (875 mg total) by mouth 2 (two) times daily.    . Ascorbic Acid (VITAMIN C ER PO) Take by mouth.   . cyclobenzaprine (FLEXERIL) 5 MG tablet TAKE 1 TABLET(5 MG) BY MOUTH AT BEDTIME  . diltiazem (CARDIZEM CD) 240 MG 24 hr capsule Take 1 capsule by mouth once daily  . glucose blood (CONTOUR NEXT TEST) test strip To check BG BID  . losartan (COZAAR) 100 MG tablet Take 1 tablet by mouth once daily  . meloxicam (MOBIC) 15 MG tablet   . Microlet Lancets MISC To check BG BID  . montelukast (SINGULAIR) 10 MG tablet Take 1 tablet (10 mg total) by mouth at bedtime. For allergies  . Multiple Vitamin (MULTIVITAMIN) tablet Take 1 tablet by mouth daily.  Vladimir Faster Glycol-Propyl Glycol (SYSTANE OP) Apply to eye.   No facility-administered medications prior to visit.    Review of Systems  Constitutional: Negative.   HENT: Negative.   Eyes: Negative.   Respiratory: Negative.   Cardiovascular: Negative.   Endocrine: Negative.   Genitourinary: Negative.   Musculoskeletal: Negative.   Skin: Negative.   Allergic/Immunologic: Negative.   Neurological: Negative.   Hematological: Negative.   Psychiatric/Behavioral: Negative.     Last CBC Lab Results  Component Value Date   WBC 9.6 07/23/2020   HGB 13.6 07/23/2020   HCT 39.9 07/23/2020   MCV 92 07/23/2020   MCH 31.3 07/23/2020   RDW 11.7 07/23/2020   PLT 447 17/40/8144   Last metabolic panel Lab Results  Component Value Date   GLUCOSE 120 (H) 07/23/2020   NA 138 07/23/2020   K 4.4 07/23/2020   CL 101 07/23/2020   CO2 24 07/23/2020   BUN 15 07/23/2020   CREATININE 0.84 07/23/2020   GFRNONAA 74 07/23/2020   GFRAA 85 07/23/2020   CALCIUM 9.7 07/23/2020   PHOS 3.9 05/28/2015   PROT 7.2 07/23/2020   ALBUMIN 4.6 07/23/2020   LABGLOB 2.6 07/23/2020   AGRATIO 1.8 07/23/2020   BILITOT 0.2 07/23/2020   ALKPHOS 85 07/23/2020   AST 18 07/23/2020   ALT 15 07/23/2020   ANIONGAP 9 08/30/2019      Objective    BP 121/69 (BP Location: Left Arm, Patient Position: Sitting, Cuff Size:  Large)   Pulse 78   Resp 16   Ht 5' 5.5" (1.664 m)   Wt 190 lb 11.2 oz (86.5 kg)   BMI 31.25 kg/m  BP Readings from Last 3 Encounters:  07/21/20 121/69  11/15/19 120/72  08/30/19 130/67   Wt Readings from Last 3 Encounters:  07/21/20 190 lb 11.2 oz (86.5 kg)  11/15/19 187 lb 3.2 oz (84.9 kg)  08/30/19 187 lb (84.8 kg)      Physical Exam Vitals reviewed.  Constitutional:      General: She is not in acute distress.    Appearance: Normal appearance. She is well-developed. She is obese. She is not ill-appearing or diaphoretic.  HENT:     Head: Normocephalic and atraumatic.     Right Ear: Tympanic membrane, ear canal and external ear normal.     Left Ear: Tympanic membrane, ear canal and external ear normal.     Nose: Nose normal.     Mouth/Throat:     Mouth: Mucous membranes are moist.     Pharynx: Oropharynx is clear. No oropharyngeal exudate or posterior oropharyngeal erythema.  Eyes:     General: No scleral icterus.       Right eye: No discharge.        Left eye: No discharge.     Extraocular Movements: Extraocular movements intact.     Conjunctiva/sclera: Conjunctivae normal.     Pupils: Pupils are equal, round, and reactive to light.  Neck:     Thyroid: No thyromegaly.     Vascular: No carotid bruit or JVD.     Trachea: No tracheal deviation.  Cardiovascular:     Rate and Rhythm: Normal rate and regular rhythm.     Pulses: Normal pulses.     Heart sounds: Normal heart sounds. No murmur heard.  No friction rub. No gallop.   Pulmonary:     Effort: Pulmonary effort is normal. No respiratory distress.     Breath sounds: Normal breath sounds. No wheezing or rales.  Chest:     Chest wall: No tenderness.  Abdominal:     General: Abdomen is flat. Bowel sounds are normal. There is no distension.     Palpations: Abdomen is soft. There is no mass.     Tenderness: There is no abdominal tenderness. There is no guarding or rebound.  Musculoskeletal:     Cervical back:  Normal range of motion and neck supple. No tenderness.     Lumbar back: Tenderness present. Decreased range of motion.     Right lower leg: No edema.     Left lower leg: No edema.  Lymphadenopathy:     Cervical: No cervical adenopathy.  Skin:    General: Skin is warm and dry.     Capillary Refill: Capillary refill takes less than 2 seconds.     Findings: No rash.  Neurological:     General: No focal deficit present.     Mental Status: She is alert and oriented to person, place, and time. Mental status is at baseline.  Psychiatric:        Mood and Affect: Mood normal.        Behavior: Behavior normal.        Thought Content: Thought content normal.        Judgment: Judgment normal.      Last depression screening scores PHQ 2/9 Scores 07/21/2020 06/29/2019 01/15/2019  PHQ - 2 Score 0 0 0  PHQ- 9 Score - - -   Last fall risk screening Fall Risk  07/21/2020  Falls in the past year? 0  Comment -  Number falls in past yr: 0  Injury with Fall? 0  Risk for fall due to : No Fall Risks  Follow up Falls evaluation completed   Last Audit-C alcohol use screening Alcohol Use Disorder Test (AUDIT) 07/21/2020  1. How often  do you have a drink containing alcohol? 0  2. How many drinks containing alcohol do you have on a typical day when you are drinking? 0  3. How often do you have six or more drinks on one occasion? 0  AUDIT-C Score 0  Alcohol Brief Interventions/Follow-up AUDIT Score <7 follow-up not indicated   A score of 3 or more in women, and 4 or more in men indicates increased risk for alcohol abuse, EXCEPT if all of the points are from question 1   No results found for any visits on 07/21/20.  Assessment & Plan    Routine Health Maintenance and Physical Exam  Exercise Activities and Dietary recommendations Goals   None     Immunization History  Administered Date(s) Administered  . Influenza Inj Mdck Quad Pf 09/19/2018  . Influenza,inj,Quad PF,6+ Mos 07/03/2014,  06/29/2019  . Influenza-Unspecified 06/27/2020  . Moderna SARS-COVID-2 Vaccination 12/13/2019, 01/10/2020  . Tdap 06/29/2019  . Zoster 11/08/2011    Health Maintenance  Topic Date Due  . PNEUMOCOCCAL POLYSACCHARIDE VACCINE AGE 15-64 HIGH RISK  Never done  . OPHTHALMOLOGY EXAM  Never done  . FOOT EXAM  07/04/2019  . HEMOGLOBIN A1C  12/28/2019  . MAMMOGRAM  06/02/2020  . PAP SMEAR-Modifier  06/20/2021  . Fecal DNA (Cologuard)  07/05/2021  . TETANUS/TDAP  06/28/2029  . INFLUENZA VACCINE  Completed  . COVID-19 Vaccine  Completed  . Hepatitis C Screening  Completed    Discussed health benefits of physical activity, and encouraged her to engage in regular exercise appropriate for her age and condition.  1. Annual physical exam Normal physical exam today. Will check labs as below and f/u pending lab results. If labs are stable and WNL she will not need to have these rechecked for one year at her next annual physical exam. She is to call the office in the meantime if she has any acute issue, questions or concerns.  2. Hyperthyroidism Will check labs as below and f/u pending results. - TSH  3. Type 2 diabetes mellitus without complication, without long-term current use of insulin (HCC) Stable. Diet controlled. Will check labs as below and f/u pending results. - CBC with Differential/Platelet - Comprehensive metabolic panel - Hemoglobin A1c - Lipid panel  4. Moderate mixed hyperlipidemia not requiring statin therapy Stable. Diet controlled. Patient declines statin. Will check labs as below and f/u pending results. - CBC with Differential/Platelet - Comprehensive metabolic panel - Hemoglobin A1c - Lipid panel  5. Class 1 obesity due to excess calories with serious comorbidity and body mass index (BMI) of 31.0 to 31.9 in adult Counseled patient on healthy lifestyle modifications including dieting and exercise.  - CBC with Differential/Platelet - Comprehensive metabolic panel -  Hemoglobin A1c - Lipid panel  6. Encounter for breast cancer screening using non-mammogram modality Breast exam today was normal. There is no family history of breast cancer. She does perform regular self breast exams. Mammogram was ordered as below. Information for [facility] was given to patient so she may schedule her mammogram at her convenience. - MM 3D SCREEN BREAST BILATERAL; Future  7. Essential hypertension Stable. Continue Diltiazem CD 240mg  daily, losartan 100mg  daily. Will check labs as below and f/u pending results.  8. Lumbar radiculopathy Noticing more. Patient has meloxicam for prn use. Discussed heating pad. Stretches and exercises provided on AVS. Call if worsening.   9. Statin medication declined by patient   No follow-ups on file.     Keturah Barre  Kaitlin Ardito, PA-C, have reviewed all documentation for this visit. The documentation on 07/28/20 for the exam, diagnosis, procedures, and orders are all accurate and complete.   Rubye Beach  [facility] [phone] (phone) [phone] (fax)  [place]

## 2020-07-21 NOTE — Patient Instructions (Signed)
[facility] at [facility] [place],  [place]  [id number] [address]: [phone]  Dr. Freddi Che (Chiropractor) [phone] [address], [place], [place] [address]   Back Exercises These exercises help to make your trunk and back strong. They also help to keep the lower back flexible. Doing these exercises can help to prevent back pain or lessen existing pain.  If you have back pain, try to do these exercises 2-3 times each day or as told by your doctor.  As you get better, do the exercises once each day. Repeat the exercises more often as told by your doctor.  To stop back pain from coming back, do the exercises once each day, or as told by your doctor. Exercises Single knee to chest Do these steps 3-5 times in a row for each leg: 1. Lie on your back on a firm bed or the floor with your legs stretched out. 2. Bring one knee to your chest. 3. Grab your knee or thigh with both hands and hold them it in place. 4. Pull on your knee until you feel a gentle stretch in your lower back or buttocks. 5. Keep doing the stretch for 10-30 seconds. 6. Slowly let go of your leg and straighten it. Pelvic tilt Do these steps 5-10 times in a row: 1. Lie on your back on a firm bed or the floor with your legs stretched out. 2. Bend your knees so they point up to the ceiling. Your feet should be flat on the floor. 3. Tighten your lower belly (abdomen) muscles to press your lower back against the floor. This will make your tailbone point up to the ceiling instead of pointing down to your feet or the floor. 4. Stay in this position for 5-10 seconds while you gently tighten your muscles and breathe evenly. Cat-cow Do these steps until your lower back bends more easily: 1. Get on your hands and knees on a firm surface. Keep your hands under your shoulders, and keep your knees under your hips. You may put padding under your knees. 2. Let your head hang down toward your  chest. Tighten (contract) the muscles in your belly. Point your tailbone toward the floor so your lower back becomes rounded like the back of a cat. 3. Stay in this position for 5 seconds. 4. Slowly lift your head. Let the muscles of your belly relax. Point your tailbone up toward the ceiling so your back forms a sagging arch like the back of a cow. 5. Stay in this position for 5 seconds.  Press-ups Do these steps 5-10 times in a row: 1. Lie on your belly (face-down) on the floor. 2. Place your hands near your head, about shoulder-width apart. 3. While you keep your back relaxed and keep your hips on the floor, slowly straighten your arms to raise the top half of your body and lift your shoulders. Do not use your back muscles. You may change where you place your hands in order to make yourself more comfortable. 4. Stay in this position for 5 seconds. 5. Slowly return to lying flat on the floor.  Bridges Do these steps 10 times in a row: 1. Lie on your back on a firm surface. 2. Bend your knees so they point up to the ceiling. Your feet should be flat on the floor. Your arms should be flat at your sides, next to your body. 3. Tighten your butt muscles and lift your butt off the  floor until your waist is almost as high as your knees. If you do not feel the muscles working in your butt and the back of your thighs, slide your feet 1-2 inches farther away from your butt. 4. Stay in this position for 3-5 seconds. 5. Slowly lower your butt to the floor, and let your butt muscles relax. If this exercise is too easy, try doing it with your arms crossed over your chest. Belly crunches Do these steps 5-10 times in a row: 1. Lie on your back on a firm bed or the floor with your legs stretched out. 2. Bend your knees so they point up to the ceiling. Your feet should be flat on the floor. 3. Cross your arms over your chest. 4. Tip your chin a little bit toward your chest but do not bend your  neck. 5. Tighten your belly muscles and slowly raise your chest just enough to lift your shoulder blades a tiny bit off of the floor. Avoid raising your body higher than that, because it can put too much stress on your low back. 6. Slowly lower your chest and your head to the floor. Back lifts Do these steps 5-10 times in a row: 1. Lie on your belly (face-down) with your arms at your sides, and rest your forehead on the floor. 2. Tighten the muscles in your legs and your butt. 3. Slowly lift your chest off of the floor while you keep your hips on the floor. Keep the back of your head in line with the curve in your back. Look at the floor while you do this. 4. Stay in this position for 3-5 seconds. 5. Slowly lower your chest and your face to the floor. Contact a doctor if:  Your back pain gets a lot worse when you do an exercise.  Your back pain does not get better 2 hours after you exercise. If you have any of these problems, stop doing the exercises. Do not do them again unless your doctor says it is okay. Get help right away if:  You have sudden, very bad back pain. If this happens, stop doing the exercises. Do not do them again unless your doctor says it is okay. This information is not intended to replace advice given to you by your health care provider. Make sure you discuss any questions you have with your health care provider. Document Revised: 05/25/2018 Document Reviewed: 05/25/2018 Elsevier Patient Education  2020 [employer].

## 2020-07-23 DIAGNOSIS — E0590 Thyrotoxicosis, unspecified without thyrotoxic crisis or storm: Secondary | ICD-10-CM | POA: Diagnosis not present

## 2020-07-23 DIAGNOSIS — E119 Type 2 diabetes mellitus without complications: Secondary | ICD-10-CM | POA: Diagnosis not present

## 2020-07-23 DIAGNOSIS — E782 Mixed hyperlipidemia: Secondary | ICD-10-CM | POA: Diagnosis not present

## 2020-07-23 DIAGNOSIS — Z6831 Body mass index (BMI) 31.0-31.9, adult: Secondary | ICD-10-CM | POA: Diagnosis not present

## 2020-07-23 DIAGNOSIS — E6609 Other obesity due to excess calories: Secondary | ICD-10-CM | POA: Diagnosis not present

## 2020-07-24 ENCOUNTER — Telehealth: Payer: Self-pay

## 2020-07-24 LAB — HEMOGLOBIN A1C
Est. average glucose Bld gHb Est-mCnc: 160 mg/dL
Hgb A1c MFr Bld: 7.2 % — ABNORMAL HIGH (ref 4.8–5.6)

## 2020-07-24 LAB — CBC WITH DIFFERENTIAL/PLATELET
Basophils Absolute: 0.1 10*3/uL (ref 0.0–0.2)
Basos: 1 %
EOS (ABSOLUTE): 0.3 10*3/uL (ref 0.0–0.4)
Eos: 3 %
Hematocrit: 39.9 % (ref 34.0–46.6)
Hemoglobin: 13.6 g/dL (ref 11.1–15.9)
Immature Grans (Abs): 0 10*3/uL (ref 0.0–0.1)
Immature Granulocytes: 0 %
Lymphocytes Absolute: 3.4 10*3/uL — ABNORMAL HIGH (ref 0.7–3.1)
Lymphs: 35 %
MCH: 31.3 pg (ref 26.6–33.0)
MCHC: 34.1 g/dL (ref 31.5–35.7)
MCV: 92 fL (ref 79–97)
Monocytes Absolute: 0.8 10*3/uL (ref 0.1–0.9)
Monocytes: 8 %
Neutrophils Absolute: 5.1 10*3/uL (ref 1.4–7.0)
Neutrophils: 53 %
Platelets: 447 10*3/uL (ref 150–450)
RBC: 4.35 x10E6/uL (ref 3.77–5.28)
RDW: 11.7 % (ref 11.7–15.4)
WBC: 9.6 10*3/uL (ref 3.4–10.8)

## 2020-07-24 LAB — COMPREHENSIVE METABOLIC PANEL
ALT: 15 IU/L (ref 0–32)
AST: 18 IU/L (ref 0–40)
Albumin/Globulin Ratio: 1.8 (ref 1.2–2.2)
Albumin: 4.6 g/dL (ref 3.8–4.8)
Alkaline Phosphatase: 85 IU/L (ref 44–121)
BUN/Creatinine Ratio: 18 (ref 12–28)
BUN: 15 mg/dL (ref 8–27)
Bilirubin Total: 0.2 mg/dL (ref 0.0–1.2)
CO2: 24 mmol/L (ref 20–29)
Calcium: 9.7 mg/dL (ref 8.7–10.3)
Chloride: 101 mmol/L (ref 96–106)
Creatinine, Ser: 0.84 mg/dL (ref 0.57–1.00)
GFR calc Af Amer: 85 mL/min/{1.73_m2} (ref >59)
GFR calc non Af Amer: 74 mL/min/{1.73_m2} (ref >59)
Globulin, Total: 2.6 g/dL (ref 1.5–4.5)
Glucose: 120 mg/dL — ABNORMAL HIGH (ref 65–99)
Potassium: 4.4 mmol/L (ref 3.5–5.2)
Sodium: 138 mmol/L (ref 134–144)
Total Protein: 7.2 g/dL (ref 6.0–8.5)

## 2020-07-24 LAB — LIPID PANEL
Chol/HDL Ratio: 4 ratio (ref 0.0–4.4)
Cholesterol, Total: 182 mg/dL (ref 100–199)
HDL: 45 mg/dL (ref >39)
LDL Chol Calc (NIH): 109 mg/dL — ABNORMAL HIGH (ref 0–99)
Triglycerides: 157 mg/dL — ABNORMAL HIGH (ref 0–149)
VLDL Cholesterol Cal: 28 mg/dL (ref 5–40)

## 2020-07-24 LAB — TSH: TSH: 4.49 u[IU]/mL (ref 0.450–4.500)

## 2020-07-24 NOTE — Telephone Encounter (Signed)
-----   Message from Mar Daring, [place] sent at 07/24/2020  8:48 AM EST ----- Blood count is normal. Kidney and liver function are normal. Sodium, potassium, and calcium are normal. A1c did increase from 6.6 to 7.2. Would recommend to consider medication for diabetes. Definitely continue working on lifestyle modifications and limiting sugars and carbohydrates in your diet. Cholesterol is normal. Thyroid is normal.

## 2020-07-24 NOTE — Telephone Encounter (Signed)
Patient was advised and states she will like to work on changing her diet. She reports that she does not want to be on any medication for diabetes. Just a [place]

## 2020-07-28 DIAGNOSIS — Z5320 Procedure and treatment not carried out because of patient's decision for unspecified reasons: Secondary | ICD-10-CM | POA: Insufficient documentation

## 2020-07-28 DIAGNOSIS — Z5329 Procedure and treatment not carried out because of patient's decision for other reasons: Secondary | ICD-10-CM | POA: Insufficient documentation

## 2020-08-11 DIAGNOSIS — H9203 Otalgia, bilateral: Secondary | ICD-10-CM | POA: Diagnosis not present

## 2020-08-11 DIAGNOSIS — R0981 Nasal congestion: Secondary | ICD-10-CM | POA: Diagnosis not present

## 2020-08-11 DIAGNOSIS — J302 Other seasonal allergic rhinitis: Secondary | ICD-10-CM | POA: Diagnosis not present

## 2020-08-11 DIAGNOSIS — H6983 Other specified disorders of Eustachian tube, bilateral: Secondary | ICD-10-CM | POA: Diagnosis not present

## 2020-08-12 ENCOUNTER — Telehealth: Payer: Self-pay

## 2020-08-12 NOTE — Telephone Encounter (Signed)
Patient scheduled.

## 2020-08-12 NOTE — Telephone Encounter (Signed)
Ok to schedule.   We can discuss referral at that visit as well

## 2020-08-12 NOTE — Telephone Encounter (Signed)
Patient advised as directed below.Per patient she asked if she can be refer to ENT and the other reason she had called to get an appointment with provider is that she has been having chest pain (in the middle of the chest) with pain radiating to her chin. This happened yesterday, reports that she only told UC that she was hurting in the middle but didn't mentioned chest pain. This has been going on for the past 6 months off and on and it doesn't radiate to her chin all the time.

## 2020-08-12 NOTE — Telephone Encounter (Signed)
She really needs to give the medications time to work. There is not much else I would do at this time.

## 2020-08-12 NOTE — Telephone Encounter (Signed)
Copied from [place] [id number]. Topic: Quick Communication - See Telephone Encounter >> Aug 12, 2020  8:29 AM Loma Boston wrote: CRM for notification. See Telephone encounter for: 08/12/20.pt has called in with a terrible ear ache .Marland KitchenMarland Kitchenpressure. Pt went to Urgent Care yesterday as advised. She was prescribed 2 meds one being an antibiotic. She has lots of fluid in her ears stated dr from [facility]. Pt wanted to make an appt for today. Called office, told that Tawanna Sat would need to make the call to see her.Just got the meds yesterday. FU if can do in office appt. [phone]

## 2020-08-12 NOTE — Telephone Encounter (Signed)
Would it be okay for her to come in the office for an appointment or should I offer her a virtual visit?  Thanks,   -Mickel Baas

## 2020-08-19 NOTE — Progress Notes (Signed)
Established patient visit   Patient: Molly Potter   DOB: Nov 11, 1955   64 y.o. Female  MRN: [id number] Visit Date: 08/20/2020  Today's healthcare provider: Mar Daring, PA-C   Chief Complaint  Patient presents with  . Chest Pain   Subjective    Chest Pain  This is a new problem. The current episode started more than 1 month ago (for the past 6 month off and on). The problem occurs daily. The problem has been unchanged. The pain is moderate. Quality: sensation. Radiates to: sometimes it radiates to the middle of her chin or from her chin to her chest. reports that this happens more when she bends down. Associated symptoms include back pain. Pertinent negatives include no abdominal pain, cough, dizziness, headaches, irregular heartbeat, lower extremity edema, numbness, palpitations, shortness of breath or weakness. The pain is aggravated by movement (bending). She has tried nothing for the symptoms.    Patient also requesting referral to ENT for her ears. Has been having issues with pressure and itching of her ears.  Patient Active Problem List   Diagnosis Date Noted  . Statin medication declined by patient 07/28/2020  . Lumbar radiculopathy 07/21/2020  . Type 2 diabetes mellitus without complication, without long-term current use of insulin ([place]) 01/03/2019  . Moderate mixed hyperlipidemia not requiring statin therapy 06/20/2018  . Primary osteoarthritis of both knees 02/16/2017  . Impingement syndrome of shoulder 07/02/2015  . Complete rotator cuff rupture of left shoulder 07/02/2015  . Cervical radiculitis 07/02/2015  . Impingement syndrome of both shoulders 07/02/2015  . Joint pain 04/10/2015  . Arthritis sicca 04/09/2015  . Accumulation of fluid in tissues 04/09/2015  . Essential hypertension 04/09/2015  . H/O eczema 04/09/2015  . Hyperthyroidism 04/09/2015  . Arthritis 04/09/2015  . Fatigue 04/09/2015   Past Medical History:  Diagnosis Date  . Arthritis    . Fatigue   . History of eczema   . Hypertension   . Hyperthyroidism   . Joint pain        Medications: Outpatient Medications Prior to Visit  Medication Sig  . acetaminophen (TYLENOL) 500 MG tablet Take 1,000 mg by mouth every 6 (six) hours as needed.  . Ascorbic Acid (VITAMIN C ER PO) Take by mouth.   . cyclobenzaprine (FLEXERIL) 5 MG tablet TAKE 1 TABLET(5 MG) BY MOUTH AT BEDTIME  . diltiazem (CARDIZEM CD) 240 MG 24 hr capsule Take 1 capsule by mouth once daily  . glucose blood (CONTOUR NEXT TEST) test strip To check BG BID  . losartan (COZAAR) 100 MG tablet Take 1 tablet by mouth once daily  . meloxicam (MOBIC) 15 MG tablet   . Microlet Lancets MISC To check BG BID  . montelukast (SINGULAIR) 10 MG tablet Take 1 tablet (10 mg total) by mouth at bedtime. For allergies  . Multiple Vitamin (MULTIVITAMIN) tablet Take 1 tablet by mouth daily.  Vladimir Faster Glycol-Propyl Glycol (SYSTANE OP) Apply to eye.   No facility-administered medications prior to visit.    Review of Systems  Constitutional: Negative.   HENT: Positive for ear pain (fullness and itching).   Respiratory: Negative for cough and shortness of breath.   Cardiovascular: Positive for chest pain. Negative for palpitations.  Gastrointestinal: Negative for abdominal pain.  Musculoskeletal: Positive for back pain.  Neurological: Negative for dizziness, weakness, numbness and headaches.    Last CBC Lab Results  Component Value Date   WBC 9.6 07/23/2020   HGB 13.6 07/23/2020   HCT  39.9 07/23/2020   MCV 92 07/23/2020   MCH 31.3 07/23/2020   RDW 11.7 07/23/2020   PLT 447 43/15/4008   Last metabolic panel Lab Results  Component Value Date   GLUCOSE 120 (H) 07/23/2020   NA 138 07/23/2020   K 4.4 07/23/2020   CL 101 07/23/2020   CO2 24 07/23/2020   BUN 15 07/23/2020   CREATININE 0.84 07/23/2020   GFRNONAA 74 07/23/2020   GFRAA 85 07/23/2020   CALCIUM 9.7 07/23/2020   PHOS 3.9 05/28/2015   PROT 7.2  07/23/2020   ALBUMIN 4.6 07/23/2020   LABGLOB 2.6 07/23/2020   AGRATIO 1.8 07/23/2020   BILITOT 0.2 07/23/2020   ALKPHOS 85 07/23/2020   AST 18 07/23/2020   ALT 15 07/23/2020   ANIONGAP 9 08/30/2019      Objective    BP 117/67 (BP Location: Left Arm, Patient Position: Sitting, Cuff Size: Large)   Pulse 77   Temp 98.6 F (37 C) (Oral)   Resp 16   Ht 5\' 5"  (1.651 m)   Wt 187 lb (84.8 kg)   BMI 31.12 kg/m  BP Readings from Last 3 Encounters:  08/20/20 117/67  07/21/20 121/69  11/15/19 120/72   Wt Readings from Last 3 Encounters:  08/20/20 187 lb (84.8 kg)  07/21/20 190 lb 11.2 oz (86.5 kg)  11/15/19 187 lb 3.2 oz (84.9 kg)      Physical Exam Vitals reviewed.  Constitutional:      General: She is not in acute distress.    Appearance: Normal appearance. She is well-developed, well-groomed and well-nourished. She is obese. She is not ill-appearing or diaphoretic.  HENT:     Head: Normocephalic and atraumatic.     Right Ear: Hearing, tympanic membrane, ear canal and external ear normal.     Left Ear: Hearing, tympanic membrane, ear canal and external ear normal.     Mouth/Throat:     Mouth: Oropharynx is clear and moist and mucous membranes are normal.     Pharynx: Uvula midline.  Eyes:     General: No scleral icterus.       Right eye: No discharge.        Left eye: No discharge.     Extraocular Movements: Extraocular movements intact.     Conjunctiva/sclera: Conjunctivae normal.     Pupils: Pupils are equal, round, and reactive to light.  Neck:     Thyroid: No thyromegaly.     Trachea: No tracheal deviation.  Cardiovascular:     Rate and Rhythm: Normal rate and regular rhythm.     Heart sounds: Normal heart sounds. No murmur heard. No friction rub. No gallop.   Pulmonary:     Effort: Pulmonary effort is normal. No respiratory distress.     Breath sounds: Normal breath sounds. No stridor. No wheezing or rales.  Musculoskeletal:     Cervical back: Normal range  of motion and neck supple.  Lymphadenopathy:     Cervical: No cervical adenopathy.  Skin:    General: Skin is warm and dry.  Neurological:     Mental Status: She is alert.  Psychiatric:        Behavior: Behavior is cooperative.      No results found for any visits on 08/20/20.  Assessment & Plan     1. Chest pain, unspecified type EKG today shows NSR rate of 76, no ST segment changes, personally reviewed by me. Suspect the pain may be radiating GERD.  - EKG 12-Lead  2. Ear pain, bilateral Multiple treatments tried without relief. Referral placed.  - Ambulatory referral to ENT  3. Gastroesophageal reflux disease without esophagitis See above medical treatment plan for #1. Will add omeprazole as below. Advised to try x 2 weeks. If chest pain is still occurring and has not improved at all with omeprazole will refer back to Cardiology. Call if not improving.  - omeprazole (PRILOSEC) 40 MG capsule; Take 1 capsule (40 mg total) by mouth daily.  Dispense: 30 capsule; Refill: 3   No follow-ups on file.      Reynolds Bowl, PA-C, have reviewed all documentation for this visit. The documentation on 08/26/20 for the exam, diagnosis, procedures, and orders are all accurate and complete.   Rubye Beach  [facility] [phone] (phone) [phone] (fax)  [place]

## 2020-08-20 ENCOUNTER — Encounter: Payer: Self-pay | Admitting: Physician Assistant

## 2020-08-20 ENCOUNTER — Other Ambulatory Visit: Payer: Self-pay

## 2020-08-20 ENCOUNTER — Ambulatory Visit: Payer: BC Managed Care – PPO | Admitting: Physician Assistant

## 2020-08-20 VITALS — BP 117/67 | HR 77 | Temp 98.6°F | Resp 16 | Ht 65.0 in | Wt 187.0 lb

## 2020-08-20 DIAGNOSIS — K219 Gastro-esophageal reflux disease without esophagitis: Secondary | ICD-10-CM

## 2020-08-20 DIAGNOSIS — H9203 Otalgia, bilateral: Secondary | ICD-10-CM | POA: Diagnosis not present

## 2020-08-20 DIAGNOSIS — R079 Chest pain, unspecified: Secondary | ICD-10-CM | POA: Diagnosis not present

## 2020-08-20 MED ORDER — OMEPRAZOLE 40 MG PO CPDR: 40.0000 mg | DELAYED_RELEASE_CAPSULE | Freq: Every day | ORAL | 3 refills | Status: DC

## 2020-08-20 NOTE — Patient Instructions (Signed)

## 2020-08-26 ENCOUNTER — Encounter: Payer: Self-pay | Admitting: Physician Assistant

## 2020-09-08 ENCOUNTER — Encounter: Payer: Self-pay | Admitting: Physician Assistant

## 2020-09-18 ENCOUNTER — Other Ambulatory Visit: Payer: Self-pay | Admitting: Physician Assistant

## 2020-09-18 DIAGNOSIS — I10 Essential (primary) hypertension: Secondary | ICD-10-CM

## 2020-09-18 DIAGNOSIS — E119 Type 2 diabetes mellitus without complications: Secondary | ICD-10-CM

## 2020-09-22 ENCOUNTER — Other Ambulatory Visit: Payer: Self-pay | Admitting: Physician Assistant

## 2020-09-22 DIAGNOSIS — H9203 Otalgia, bilateral: Secondary | ICD-10-CM | POA: Diagnosis not present

## 2020-09-22 MED ORDER — LOSARTAN POTASSIUM 50 MG PO TABS: 100.0000 mg | ORAL_TABLET | Freq: Every day | ORAL | 3 refills | Status: DC

## 2020-09-22 NOTE — Progress Notes (Signed)
Losartan changed to 50mg  taking 2 tabs to equal 100mg . 100 mg tablets are on backorder

## 2020-11-19 DIAGNOSIS — M5136 Other intervertebral disc degeneration, lumbar region: Secondary | ICD-10-CM | POA: Diagnosis not present

## 2020-11-19 DIAGNOSIS — M545 Low back pain, unspecified: Secondary | ICD-10-CM | POA: Diagnosis not present

## 2020-11-19 DIAGNOSIS — M5416 Radiculopathy, lumbar region: Secondary | ICD-10-CM | POA: Diagnosis not present

## 2020-12-02 DIAGNOSIS — N8110 Cystocele, unspecified: Secondary | ICD-10-CM | POA: Diagnosis not present

## 2020-12-17 ENCOUNTER — Other Ambulatory Visit: Payer: Self-pay | Admitting: Physician Assistant

## 2020-12-17 DIAGNOSIS — I10 Essential (primary) hypertension: Secondary | ICD-10-CM

## 2020-12-18 ENCOUNTER — Other Ambulatory Visit: Payer: Self-pay | Admitting: Physician Assistant

## 2020-12-18 DIAGNOSIS — I10 Essential (primary) hypertension: Secondary | ICD-10-CM

## 2020-12-18 DIAGNOSIS — E119 Type 2 diabetes mellitus without complications: Secondary | ICD-10-CM

## 2021-01-07 DIAGNOSIS — N8110 Cystocele, unspecified: Secondary | ICD-10-CM | POA: Diagnosis not present

## 2021-01-07 DIAGNOSIS — N952 Postmenopausal atrophic vaginitis: Secondary | ICD-10-CM | POA: Diagnosis not present

## 2021-01-11 HISTORY — PX: ABDOMINAL HYSTERECTOMY: SHX81

## 2021-01-14 ENCOUNTER — Other Ambulatory Visit: Payer: Self-pay | Admitting: Obstetrics and Gynecology

## 2021-01-15 DIAGNOSIS — Z01818 Encounter for other preprocedural examination: Secondary | ICD-10-CM | POA: Diagnosis not present
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
## 2021-01-19 ENCOUNTER — Encounter: Payer: Self-pay | Admitting: Family Medicine

## 2021-01-19 ENCOUNTER — Ambulatory Visit: Payer: Self-pay | Admitting: Physician Assistant

## 2021-01-19 ENCOUNTER — Ambulatory Visit: Payer: BC Managed Care – PPO | Admitting: Family Medicine

## 2021-01-19 ENCOUNTER — Other Ambulatory Visit: Payer: Self-pay

## 2021-01-19 VITALS — BP 112/58 | HR 79 | Temp 98.1°F | Resp 16 | Ht 65.0 in | Wt 188.1 lb

## 2021-01-19 DIAGNOSIS — I152 Hypertension secondary to endocrine disorders: Secondary | ICD-10-CM | POA: Diagnosis not present

## 2021-01-19 DIAGNOSIS — E1159 Type 2 diabetes mellitus with other circulatory complications: Secondary | ICD-10-CM | POA: Diagnosis not present

## 2021-01-19 DIAGNOSIS — E0590 Thyrotoxicosis, unspecified without thyrotoxic crisis or storm: Secondary | ICD-10-CM | POA: Diagnosis not present

## 2021-01-19 DIAGNOSIS — E785 Hyperlipidemia, unspecified: Secondary | ICD-10-CM | POA: Diagnosis not present

## 2021-01-19 DIAGNOSIS — E1169 Type 2 diabetes mellitus with other specified complication: Secondary | ICD-10-CM | POA: Diagnosis not present

## 2021-01-19 LAB — POCT GLYCOSYLATED HEMOGLOBIN (HGB A1C)
Est. average glucose Bld gHb Est-mCnc: 154
Hemoglobin A1C: 7 % — AB (ref 4.0–5.6)

## 2021-01-19 NOTE — Progress Notes (Signed)
Established patient visit   Patient: Molly Potter   DOB: 10-08-1955   65 y.o. Female  MRN: [id number] Visit Date: 01/19/2021  Today's healthcare provider: Lavon Paganini, MD   Chief Complaint  Patient presents with  . Diabetes  . Hypothyroidism  . Hyperlipidemia  . Hypertension   Subjective    Diabetes Pertinent negatives for hypoglycemia include no dizziness or headaches. Pertinent negatives for diabetes include no chest pain and no fatigue.  Hyperlipidemia Pertinent negatives include no chest pain or shortness of breath.  Hypertension Pertinent negatives include no chest pain, headaches or shortness of breath.    Hypothyroid, follow-up  Lab Results  Component Value Date   TSH 4.490 07/23/2020   TSH 3.700 06/29/2019   TSH 4.460 06/20/2018   FREET4 1.10 05/28/2015   Wt Readings from Last 3 Encounters:  01/19/21 188 lb 1.6 oz (85.3 kg)  08/20/20 187 lb (84.8 kg)  07/21/20 190 lb 11.2 oz (86.5 kg)    She was last seen for hypothyroid 6 months ago.  Management since that visit includes no changes. She reports excellent compliance with treatment. She is not having side effects.   Symptoms: Yes change in energy level No constipation  No diarrhea No heat / cold intolerance  No nervousness No palpitations  No weight changes    ----------------------------------------------------------------------------------------- Diabetes Mellitus Type II, Follow-up  Lab Results  Component Value Date   HGBA1C 7.0 (A) 01/19/2021   HGBA1C 7.2 (H) 07/23/2020   HGBA1C 6.6 (H) 06/29/2019   Wt Readings from Last 3 Encounters:  01/19/21 188 lb 1.6 oz (85.3 kg)  08/20/20 187 lb (84.8 kg)  07/21/20 190 lb 11.2 oz (86.5 kg)   Last seen for diabetes 6 months ago.  Management since then includes no changes. Patient declined to start oral medication for diabetes. Diet controlled. Symptoms: Yes fatigue No foot ulcerations  No appetite changes No nausea  No paresthesia of  the feet  No polydipsia  No polyuria No visual disturbances   No vomiting     Home blood sugar records: not bfeing checked  Episodes of hypoglycemia? No    Current insulin regiment: none Most Recent Eye Exam: [facility] 03/2020 Current exercise: none Current diet habits: in general, a "healthy" diet    Pertinent Labs: Lab Results  Component Value Date   CHOL 182 07/23/2020   HDL 45 07/23/2020   LDLCALC 109 (H) 07/23/2020   TRIG 157 (H) 07/23/2020   CHOLHDL 4.0 07/23/2020   Lab Results  Component Value Date   NA 138 07/23/2020   K 4.4 07/23/2020   CREATININE 0.84 07/23/2020   GFRNONAA 74 07/23/2020   GFRAA 85 07/23/2020   GLUCOSE 120 (H) 07/23/2020     --------------------------------------------------------------------------------------------------- Lipid/Cholesterol, Follow-up  Last lipid panel Other pertinent labs  Lab Results  Component Value Date   CHOL 182 07/23/2020   HDL 45 07/23/2020   LDLCALC 109 (H) 07/23/2020   TRIG 157 (H) 07/23/2020   CHOLHDL 4.0 07/23/2020   Lab Results  Component Value Date   ALT 15 07/23/2020   AST 18 07/23/2020   PLT 447 07/23/2020   TSH 4.490 07/23/2020     She was last seen for this 6 months ago.  Management since that visit includes no changes.  Patient declines statin.  Symptoms: No chest pain No chest pressure/discomfort  No dyspnea No lower extremity edema  No numbness or tingling of extremity No orthopnea  No palpitations No paroxysmal nocturnal dyspnea  No speech difficulty No syncope   Current diet: in general, a "healthy" diet   Current exercise: none  The 10-year ASCVD risk score Mikey Bussing DC Jr., et al., 2013) is: 13.9%  --------------------------------------------------------------------------------------------------- Hypertension, follow-up  BP Readings from Last 3 Encounters:  01/19/21 (!) 112/58  08/20/20 117/67  07/21/20 121/69   Wt Readings from Last 3 Encounters:  01/19/21 188 lb 1.6 oz  (85.3 kg)  08/20/20 187 lb (84.8 kg)  07/21/20 190 lb 11.2 oz (86.5 kg)     She was last seen for hypertension 6 months ago.  BP at that visit was 121/69. Management since that visit includes no changes.  She reports excellent compliance with treatment. She is not having side effects.  She is following a Regular, Low Sodium diet. She is not exercising. She does not smoke.  Use of agents associated with hypertension: none.   Outside blood pressures are not being checked. Symptoms: No chest pain No chest pressure  No palpitations No syncope  No dyspnea No orthopnea  No paroxysmal nocturnal dyspnea No lower extremity edema   Pertinent labs: Lab Results  Component Value Date   CHOL 182 07/23/2020   HDL 45 07/23/2020   LDLCALC 109 (H) 07/23/2020   TRIG 157 (H) 07/23/2020   CHOLHDL 4.0 07/23/2020   Lab Results  Component Value Date   NA 138 07/23/2020   K 4.4 07/23/2020   CREATININE 0.84 07/23/2020   GFRNONAA 74 07/23/2020   GFRAA 85 07/23/2020   GLUCOSE 120 (H) 07/23/2020     The 10-year ASCVD risk score Mikey Bussing DC Jr., et al., 2013) is: 13.9%   ---------------------------------------------------------------------------------------------------   Patient Active Problem List   Diagnosis Date Noted  . Statin medication declined by patient 07/28/2020  . Lumbar radiculopathy 07/21/2020  . T2DM (type 2 diabetes mellitus) ([place]) 01/03/2019  . Hyperlipidemia associated with type 2 diabetes mellitus ([place]) 06/20/2018  . Primary osteoarthritis of both knees 02/16/2017  . Impingement syndrome of shoulder 07/02/2015  . Complete rotator cuff rupture of left shoulder 07/02/2015  . Cervical radiculitis 07/02/2015  . Impingement syndrome of both shoulders 07/02/2015  . Arthritis sicca 04/09/2015  . Hypertension associated with diabetes ([place]) 04/09/2015  . H/O eczema 04/09/2015  . Hyperthyroidism 04/09/2015  . Arthritis 04/09/2015   Social History   Tobacco Use  . Smoking  status: Never Smoker  . Smokeless tobacco: Never Used  Vaping Use  . Vaping Use: Never used  Substance Use Topics  . Alcohol use: Not Currently    Comment: Occasional  . Drug use: No   Allergies  Allergen Reactions  . Effexor [Venlafaxine] Anaphylaxis  . Hylan G-F 20 Other (See Comments)    Myalgias, hand pain, back pain; Benadryl helped pain Other reaction(s): Other Myalgias, hand pain, back pain; Benadryl helped pain Burning sensation all over body  . Diazepam     Other reaction(s): Unknown  . Tramadol Nausea Only  . Methylprednisolone Nausea Only and Other (See Comments)    Headaches and chest pain       Medications: Outpatient Medications Prior to Visit  Medication Sig  . acetaminophen (TYLENOL) 500 MG tablet Take 1,000 mg by mouth every 6 (six) hours as needed for moderate pain.  . cholecalciferol (VITAMIN D3) 25 MCG (1000 UNIT) tablet Take 1,000 Units by mouth daily.  . cyclobenzaprine (FLEXERIL) 5 MG tablet TAKE 1 TABLET(5 MG) BY MOUTH AT BEDTIME (Patient taking differently: Take 5 mg by mouth daily as needed for muscle spasms.)  . diclofenac  Sodium (VOLTAREN) 1 % GEL Apply 2 g topically daily as needed (pain).  Marland Kitchen diltiazem (CARDIZEM CD) 240 MG 24 hr capsule Take 1 capsule by mouth once daily (Patient taking differently: Take 240 mg by mouth daily.)  . glucose blood (CONTOUR NEXT TEST) test strip To check BG BID  . loratadine (CLARITIN) 10 MG tablet Take 10 mg by mouth daily as needed for allergies.  Marland Kitchen losartan (COZAAR) 50 MG tablet Take 2 tablets (100 mg total) by mouth daily.  . Microlet Lancets MISC To check BG BID  . montelukast (SINGULAIR) 10 MG tablet Take 1 tablet (10 mg total) by mouth at bedtime. For allergies  . Multiple Vitamin (MULTIVITAMIN) tablet Take 1 tablet by mouth daily.  Marland Kitchen omeprazole (PRILOSEC) 40 MG capsule Take 1 capsule (40 mg total) by mouth daily.  Bertram Gala Glycol-Propyl Glycol (SYSTANE OP) Place 1 drop into both eyes daily as needed  (dry/irritated eyes).  . [DISCONTINUED] losartan (COZAAR) 100 MG tablet Take 100 mg by mouth daily.   No facility-administered medications prior to visit.    Review of Systems  Constitutional: Negative for chills, fatigue and fever.  HENT: Negative for congestion, ear pain, rhinorrhea, sinus pain and sore throat.   Respiratory: Negative for cough, shortness of breath and wheezing.   Cardiovascular: Negative for chest pain and leg swelling.  Gastrointestinal: Negative for abdominal pain, blood in stool, diarrhea, nausea and vomiting.  Genitourinary: Negative for dysuria, flank pain, frequency and urgency.  Neurological: Negative for dizziness and headaches.        Objective    BP (!) 112/58 (BP Location: Left Arm, Patient Position: Sitting, Cuff Size: Large)   Pulse 79   Temp 98.1 F (36.7 C) (Oral)   Resp 16   Ht 5\' 5"  (1.651 m)   Wt 188 lb 1.6 oz (85.3 kg)   SpO2 98%   BMI 31.30 kg/m  BP Readings from Last 3 Encounters:  01/19/21 (!) 112/58  08/20/20 117/67  07/21/20 121/69   Wt Readings from Last 3 Encounters:  01/19/21 188 lb 1.6 oz (85.3 kg)  08/20/20 187 lb (84.8 kg)  07/21/20 190 lb 11.2 oz (86.5 kg)      Physical Exam Vitals reviewed.  Constitutional:      General: She is not in acute distress.    Appearance: Normal appearance. She is well-developed. She is not diaphoretic.  HENT:     Head: Normocephalic and atraumatic.  Eyes:     General: No scleral icterus.    Conjunctiva/sclera: Conjunctivae normal.  Neck:     Thyroid: No thyromegaly.  Cardiovascular:     Rate and Rhythm: Normal rate and regular rhythm.     Pulses: Normal pulses.     Heart sounds: Murmur heard.    Pulmonary:     Effort: Pulmonary effort is normal. No respiratory distress.     Breath sounds: Normal breath sounds. No wheezing, rhonchi or rales.  Musculoskeletal:     Cervical back: Neck supple.     Right lower leg: No edema.     Left lower leg: No edema.  Lymphadenopathy:      Cervical: No cervical adenopathy.  Skin:    General: Skin is warm and dry.     Findings: No rash.  Neurological:     Mental Status: She is alert and oriented to person, place, and time. Mental status is at baseline.  Psychiatric:        Mood and Affect: Mood normal.  Behavior: Behavior normal.       Results for orders placed or performed in visit on 01/19/21  POCT glycosylated hemoglobin (Hb A1C)  Result Value Ref Range   Hemoglobin A1C 7.0 (A) 4.0 - 5.6 %   Est. average glucose Bld gHb Est-mCnc 154     Assessment & Plan     Problem List Items Addressed This Visit      Cardiovascular and Mediastinum   Hypertension associated with diabetes ([place]) - Primary    Well controlled Continue current medications Recheck metabolic panel F/u in 6 months       Relevant Orders   Comprehensive metabolic panel     Endocrine   Hyperthyroidism    In remission Previously f/b endocrinology      Relevant Orders   TSH   Hyperlipidemia associated with type 2 diabetes mellitus ([place])    Previously elevated Patient declines statin Will recheck FLP and CMP Goal LDL <70      Relevant Orders   Lipid panel   Comprehensive metabolic panel   O6ZT (type 2 diabetes mellitus) ([place])    Fairly well controlled Discussed goal A1c<7 On no meds Continue to work on diet exercise Associated in HTN, HLD UTD on vaccines ROI for last eye exam Foot exam today On ARB F/u in 6 months      Relevant Orders   POCT glycosylated hemoglobin (Hb A1C) (Completed)       Return in about 6 months (around 07/22/2021) for CPE.       Frederic Jericho [place],acting as a Education administrator for Lavon Paganini, MD.,have documented all relevant documentation on the behalf of Lavon Paganini, MD,as directed by  Lavon Paganini, MD while in the presence of Lavon Paganini, MD.  I, Lavon Paganini, MD, have reviewed all documentation for this visit. The documentation on 01/19/21 for the exam, diagnosis,  procedures, and orders are all accurate and complete.   Sherill Wegener, Dionne Bucy, MD, MPH [facility]

## 2021-01-19 NOTE — Assessment & Plan Note (Signed)
Fairly well controlled Discussed goal A1c<7 On no meds Continue to work on diet exercise Associated in HTN, HLD UTD on vaccines ROI for last eye exam Foot exam today On ARB F/u in 6 months

## 2021-01-19 NOTE — Assessment & Plan Note (Signed)
Previously elevated Patient declines statin Will recheck FLP and CMP Goal LDL <70

## 2021-01-19 NOTE — Assessment & Plan Note (Signed)
In remission Previously f/b endocrinology

## 2021-01-19 NOTE — Assessment & Plan Note (Signed)
Well controlled Continue current medications Recheck metabolic panel F/u in 6 months  

## 2021-01-20 ENCOUNTER — Telehealth: Payer: Self-pay

## 2021-01-20 LAB — TSH: TSH: 2.24 u[IU]/mL (ref 0.450–4.500)

## 2021-01-20 LAB — LIPID PANEL
Chol/HDL Ratio: 3.7 ratio (ref 0.0–4.4)
Cholesterol, Total: 174 mg/dL (ref 100–199)
HDL: 47 mg/dL (ref >39)
LDL Chol Calc (NIH): 106 mg/dL — ABNORMAL HIGH (ref 0–99)
Triglycerides: 118 mg/dL (ref 0–149)
VLDL Cholesterol Cal: 21 mg/dL (ref 5–40)

## 2021-01-20 LAB — COMPREHENSIVE METABOLIC PANEL
ALT: 13 IU/L (ref 0–32)
AST: 18 IU/L (ref 0–40)
Albumin/Globulin Ratio: 1.7 (ref 1.2–2.2)
Albumin: 4.3 g/dL (ref 3.8–4.8)
Alkaline Phosphatase: 89 IU/L (ref 44–121)
BUN/Creatinine Ratio: 16 (ref 12–28)
BUN: 14 mg/dL (ref 8–27)
Bilirubin Total: <0.2 mg/dL (ref 0.0–1.2)
CO2: 24 mmol/L (ref 20–29)
Calcium: 9.6 mg/dL (ref 8.7–10.3)
Chloride: 104 mmol/L (ref 96–106)
Creatinine, Ser: 0.86 mg/dL (ref 0.57–1.00)
Globulin, Total: 2.6 g/dL (ref 1.5–4.5)
Glucose: 151 mg/dL — ABNORMAL HIGH (ref 65–99)
Potassium: 4.2 mmol/L (ref 3.5–5.2)
Sodium: 143 mmol/L (ref 134–144)
Total Protein: 6.9 g/dL (ref 6.0–8.5)
eGFR: 75 mL/min/{1.73_m2} (ref >59)

## 2021-01-20 NOTE — Telephone Encounter (Signed)
-----   Message from Virginia Crews, MD sent at 01/20/2021  8:17 AM EDT ----- Normal labs, except cholesterol is still above goal in the setting of diabetes.  Goal LDL, bad cholesterol, for patients with diabetes is less than 70.  This is to prevent heart attack and stroke.  It appears patient has previously declined statin, but if she is amenable, I would recommend Crestor 5 mg daily -okay to send in 90-day supply with 3 refills if she agrees.  Can be taken with co-Q10 to prevent myalgias.

## 2021-01-20 NOTE — Telephone Encounter (Signed)
Pt advised.  She declined starting a cholesterol medication at this time.  She is going to work on lifestyle changes.  Thanks,   -Mickel Baas

## 2021-01-23 ENCOUNTER — Inpatient Hospital Stay: Admission: RE | Admit: 2021-01-23 | Payer: BC Managed Care – PPO | Source: Ambulatory Visit

## 2021-01-26 ENCOUNTER — Telehealth: Payer: Self-pay | Admitting: Family Medicine

## 2021-01-26 ENCOUNTER — Encounter
Admission: RE | Admit: 2021-01-26 | Discharge: 2021-01-26 | Disposition: A | Discharge status: Home / Self Care | Payer: BC Managed Care – PPO | Source: Ambulatory Visit | Attending: Obstetrics and Gynecology | Admitting: Obstetrics and Gynecology

## 2021-01-26 ENCOUNTER — Other Ambulatory Visit: Payer: Self-pay

## 2021-01-26 DIAGNOSIS — Z01812 Encounter for preprocedural laboratory examination: Secondary | ICD-10-CM | POA: Insufficient documentation

## 2021-01-26 DIAGNOSIS — E1169 Type 2 diabetes mellitus with other specified complication: Secondary | ICD-10-CM

## 2021-01-26 DIAGNOSIS — E785 Hyperlipidemia, unspecified: Secondary | ICD-10-CM

## 2021-01-26 LAB — TYPE AND SCREEN
ABO/RH(D): A POS
Antibody Screen: NEGATIVE

## 2021-01-26 LAB — BASIC METABOLIC PANEL
Anion gap: 8 (ref 5–15)
BUN: 16 mg/dL (ref 8–23)
CO2: 26 mmol/L (ref 22–32)
Calcium: 9 mg/dL (ref 8.9–10.3)
Chloride: 105 mmol/L (ref 98–111)
Creatinine, Ser: 0.82 mg/dL (ref 0.44–1.00)
GFR, Estimated: >60 mL/min (ref >60)
Glucose, Bld: 149 mg/dL — ABNORMAL HIGH (ref 70–99)
Potassium: 3.7 mmol/L (ref 3.5–5.1)
Sodium: 139 mmol/L (ref 135–145)

## 2021-01-26 LAB — CBC
HCT: 38.8 % (ref 36.0–46.0)
Hemoglobin: 12.9 g/dL (ref 12.0–15.0)
MCH: 31 pg (ref 26.0–34.0)
MCHC: 33.2 g/dL (ref 30.0–36.0)
MCV: 93.3 fL (ref 80.0–100.0)
Platelets: 418 10*3/uL — ABNORMAL HIGH (ref 150–400)
RBC: 4.16 MIL/uL (ref 3.87–5.11)
RDW: 12.1 % (ref 11.5–15.5)
WBC: 10.7 10*3/uL — ABNORMAL HIGH (ref 4.0–10.5)
nRBC: 0 % (ref 0.0–0.2)

## 2021-01-26 MED ORDER — ROSUVASTATIN CALCIUM 5 MG PO TABS: 5.0000 mg | ORAL_TABLET | Freq: Every day | ORAL | 3 refills | Status: DC

## 2021-01-26 NOTE — Telephone Encounter (Signed)
See telephone encounter 01/20/21, will send in prescription for Crestor as below. KW-

## 2021-01-26 NOTE — Telephone Encounter (Signed)
Pt called and wanted to let Dr. B know that she has changed her mind and would like the low dose of cholesterol medication called in / pt would like it sent to  [facility], [place] Phone:  [phone]  Fax:  [phone]

## 2021-01-28 ENCOUNTER — Encounter
Admission: RE | Admit: 2021-01-28 | Discharge: 2021-01-28 | Disposition: A | Discharge status: Home / Self Care | Payer: BC Managed Care – PPO | Source: Ambulatory Visit | Attending: Obstetrics and Gynecology | Admitting: Obstetrics and Gynecology

## 2021-01-28 ENCOUNTER — Other Ambulatory Visit: Payer: Self-pay

## 2021-01-28 HISTORY — DX: Gastro-esophageal reflux disease without esophagitis: K21.9

## 2021-01-28 HISTORY — DX: Other specified postprocedural states: Z98.890

## 2021-01-28 HISTORY — DX: Type 2 diabetes mellitus without complications: E11.9

## 2021-01-28 HISTORY — DX: Nausea with vomiting, unspecified: R11.2

## 2021-01-28 NOTE — Patient Instructions (Signed)
Your procedure is scheduled on: 02/02/21 Report to [place]. To find out your arrival time please call [phone] between 1PM - 3PM on 01/30/21.  Remember: Instructions that are not followed completely may result in serious medical risk, up to and including death, or upon the discretion of your surgeon and anesthesiologist your surgery may need to be rescheduled.     _X__ 1. Do not eat food after midnight the night before your procedure.                 No gum chewing or hard candies. You may drink clear liquids up to 2 hours                 before you are scheduled to arrive for your surgery- DO not drink clear                 liquids within 2 hours of the start of your surgery.                 Clear Liquids include:  water, apple juice without pulp, clear carbohydrate                 drink such as Clearfast or Gatorade, Black Coffee or Tea (Do not add                 anything to coffee or tea). Diabetics water only  __X__2.  On the morning of surgery brush your teeth with toothpaste and water, you                 may rinse your mouth with mouthwash if you wish.  Do not swallow any              toothpaste of mouthwash.     _X__ 3.  No Alcohol for 24 hours before or after surgery.   _X__ 4.  Do Not Smoke or use e-cigarettes For 24 Hours Prior to Your Surgery.                 Do not use any chewable tobacco products for at least 6 hours prior to                 surgery.  ____  5.  Bring all medications with you on the day of surgery if instructed.   __X__  6.  Notify your doctor if there is any change in your medical condition      (cold, fever, infections).     Do not wear jewelry, make-up, hairpins, clips or nail polish. Do not wear lotions, powders, or perfumes.  Do not shave 48 hours prior to surgery. Men may shave face and neck. Do not bring valuables to the hospital.    [facility] is not responsible for any belongings or  valuables.  Contacts, dentures/partials or body piercings may not be worn into surgery. Bring a case for your contacts, glasses or hearing aids, a denture cup will be supplied. Leave your suitcase in the car. After surgery it may be brought to your room. For patients admitted to the hospital, discharge time is determined by your treatment team.   Patients discharged the day of surgery will not be allowed to drive home.   Please read over the following fact sheets that you were given:   Incentive Spirometer  __X__ Take these medicines the morning of surgery with A SIP OF WATER:  1. rosuvastatin (CRESTOR) 5 MG tablet  2.   3.   4.  5.  6.  ____ Fleet Enema (as directed)   __X__ Use CHG Soap/SAGE wipes as directed  ____ Use inhalers on the day of surgery  ____ Stop metformin/Janumet/Farxiga 2 days prior to surgery    ____ Take 1/2 of usual insulin dose the night before surgery. No insulin the morning          of surgery.   ____ Stop Blood Thinners Coumadin/Plavix/Xarelto/Pleta/Pradaxa/Eliquis/Effient/Aspirin  on   Or contact your Surgeon, Cardiologist or Medical Doctor regarding  ability to stop your blood thinners  __X__ Stop Anti-inflammatories 7 days before surgery such as Advil, Ibuprofen, Motrin,  BC or Goodies Powder, Naprosyn, Naproxen, Aleve, Aspirin    __X__ Stop all herbal supplements, fish oil or vitamin E until after surgery.    ____ Bring C-Pap to the hospital.

## 2021-01-30 ENCOUNTER — Other Ambulatory Visit: Payer: Self-pay

## 2021-01-30 ENCOUNTER — Other Ambulatory Visit
Admission: RE | Admit: 2021-01-30 | Discharge: 2021-01-30 | Disposition: A | Discharge status: Home / Self Care | Payer: BC Managed Care – PPO | Source: Ambulatory Visit | Attending: Obstetrics and Gynecology | Admitting: Obstetrics and Gynecology

## 2021-01-30 DIAGNOSIS — Z20822 Contact with and (suspected) exposure to covid-19: Secondary | ICD-10-CM | POA: Insufficient documentation

## 2021-01-30 DIAGNOSIS — Z01812 Encounter for preprocedural laboratory examination: Secondary | ICD-10-CM | POA: Diagnosis not present

## 2021-01-30 LAB — SARS CORONAVIRUS 2 (TAT 6-24 HRS): SARS Coronavirus 2: NEGATIVE

## 2021-02-02 ENCOUNTER — Other Ambulatory Visit: Payer: Self-pay

## 2021-02-02 ENCOUNTER — Observation Stay
Admission: RE | Admit: 2021-02-02 | Discharge: 2021-02-03 | Disposition: A | Discharge status: Home / Self Care | Payer: BC Managed Care – PPO | Attending: Obstetrics and Gynecology | Admitting: Obstetrics and Gynecology

## 2021-02-02 ENCOUNTER — Ambulatory Visit: Payer: BC Managed Care – PPO | Admitting: Anesthesiology

## 2021-02-02 ENCOUNTER — Encounter
Admission: RE | Disposition: A | Discharge status: Home / Self Care | Payer: Self-pay | Source: Home / Self Care | Attending: Obstetrics and Gynecology

## 2021-02-02 DIAGNOSIS — N819 Female genital prolapse, unspecified: Secondary | ICD-10-CM | POA: Diagnosis not present

## 2021-02-02 DIAGNOSIS — N814 Uterovaginal prolapse, unspecified: Secondary | ICD-10-CM | POA: Diagnosis not present

## 2021-02-02 DIAGNOSIS — N841 Polyp of cervix uteri: Secondary | ICD-10-CM | POA: Diagnosis not present

## 2021-02-02 DIAGNOSIS — N8110 Cystocele, unspecified: Secondary | ICD-10-CM | POA: Diagnosis not present

## 2021-02-02 DIAGNOSIS — N8501 Benign endometrial hyperplasia: Secondary | ICD-10-CM | POA: Diagnosis not present

## 2021-02-02 DIAGNOSIS — N800 Endometriosis of uterus: Secondary | ICD-10-CM | POA: Diagnosis not present

## 2021-02-02 DIAGNOSIS — Z79899 Other long term (current) drug therapy: Secondary | ICD-10-CM | POA: Insufficient documentation

## 2021-02-02 DIAGNOSIS — I10 Essential (primary) hypertension: Secondary | ICD-10-CM | POA: Diagnosis not present

## 2021-02-02 DIAGNOSIS — E0590 Thyrotoxicosis, unspecified without thyrotoxic crisis or storm: Secondary | ICD-10-CM | POA: Diagnosis not present

## 2021-02-02 DIAGNOSIS — D259 Leiomyoma of uterus, unspecified: Secondary | ICD-10-CM | POA: Diagnosis not present

## 2021-02-02 DIAGNOSIS — N840 Polyp of corpus uteri: Secondary | ICD-10-CM | POA: Diagnosis not present

## 2021-02-02 HISTORY — PX: CYSTOSCOPY: SHX5120

## 2021-02-02 HISTORY — PX: LAPAROSCOPIC BILATERAL SALPINGECTOMY: SHX5889

## 2021-02-02 HISTORY — PX: CYSTOCELE REPAIR: SHX163

## 2021-02-02 HISTORY — PX: LAPAROSCOPIC ASSISTED VAGINAL HYSTERECTOMY: SHX5398

## 2021-02-02 LAB — ABO/RH: ABO/RH(D): A POS

## 2021-02-02 LAB — GLUCOSE, CAPILLARY
Glucose-Capillary: 175 mg/dL — ABNORMAL HIGH (ref 70–99)
Glucose-Capillary: 206 mg/dL — ABNORMAL HIGH (ref 70–99)
Glucose-Capillary: 213 mg/dL — ABNORMAL HIGH (ref 70–99)
Glucose-Capillary: 197 mg/dL — ABNORMAL HIGH (ref 70–99)

## 2021-02-02 SURGERY — HYSTERECTOMY, VAGINAL, LAPAROSCOPY-ASSISTED
Anesthesia: General

## 2021-02-02 MED ORDER — FENTANYL CITRATE (PF) 100 MCG/2ML IJ SOLN
25.0000 ug | INTRAMUSCULAR | Status: DC | PRN
  Administered 2021-02-02 (×3): 25 ug via INTRAVENOUS

## 2021-02-02 MED ORDER — INSULIN ASPART 100 UNIT/ML IJ SOLN: 0.0000 [IU] | Freq: Three times a day (TID) | INTRAMUSCULAR | Status: DC

## 2021-02-02 MED ORDER — ACETAMINOPHEN 500 MG PO TABS
1000.0000 mg | ORAL_TABLET | Freq: Four times a day (QID) | ORAL | Status: DC | PRN
  Administered 2021-02-02 – 2021-02-03 (×4): 1000 mg via ORAL
  Filled 2021-02-02 (×4): qty 2

## 2021-02-02 MED ORDER — FAMOTIDINE 20 MG PO TABS
ORAL_TABLET | ORAL | Status: AC
  Administered 2021-02-02: 20 mg
  Filled 2021-02-02: qty 1

## 2021-02-02 MED ORDER — FENTANYL CITRATE (PF) 100 MCG/2ML IJ SOLN
INTRAMUSCULAR | Status: AC
  Administered 2021-02-02: 25 ug via INTRAVENOUS
  Filled 2021-02-02: qty 2

## 2021-02-02 MED ORDER — CEFAZOLIN SODIUM-DEXTROSE 2-4 GM/100ML-% IV SOLN
2.0000 g | INTRAVENOUS | Status: AC
  Administered 2021-02-02: 2 g via INTRAVENOUS

## 2021-02-02 MED ORDER — CHLORHEXIDINE GLUCONATE 0.12 % MT SOLN
OROMUCOSAL | Status: AC
  Filled 2021-02-02: qty 15

## 2021-02-02 MED ORDER — INSULIN ASPART 100 UNIT/ML IJ SOLN
0.0000 [IU] | Freq: Three times a day (TID) | INTRAMUSCULAR | Status: DC
  Administered 2021-02-02: 5 [IU] via SUBCUTANEOUS
  Administered 2021-02-03: 3 [IU] via SUBCUTANEOUS
  Filled 2021-02-02 (×2): qty 1

## 2021-02-02 MED ORDER — LIDOCAINE HCL (CARDIAC) PF 100 MG/5ML IV SOSY
PREFILLED_SYRINGE | INTRAVENOUS | Status: DC | PRN
  Administered 2021-02-02: 90 mg via INTRAVENOUS

## 2021-02-02 MED ORDER — KETOROLAC TROMETHAMINE 30 MG/ML IJ SOLN: 30.0000 mg | Freq: Once | INTRAMUSCULAR | Status: DC

## 2021-02-02 MED ORDER — APREPITANT 40 MG PO CAPS
40.0000 mg | ORAL_CAPSULE | Freq: Once | ORAL | Status: AC
  Administered 2021-02-02: 40 mg via ORAL

## 2021-02-02 MED ORDER — VITAMIN D3 25 MCG (1000 UNIT) PO TABS
1000.0000 [IU] | ORAL_TABLET | Freq: Every day | ORAL | Status: DC
  Filled 2021-02-02 (×3): qty 1

## 2021-02-02 MED ORDER — LORATADINE 10 MG PO TABS
10.0000 mg | ORAL_TABLET | Freq: Every day | ORAL | Status: DC | PRN
  Administered 2021-02-03: 10 mg via ORAL
  Filled 2021-02-02 (×2): qty 1

## 2021-02-02 MED ORDER — ACETAMINOPHEN 10 MG/ML IV SOLN
INTRAVENOUS | Status: DC | PRN
  Administered 2021-02-02: 1000 mg via INTRAVENOUS

## 2021-02-02 MED ORDER — POLYVINYL ALCOHOL 1.4 % OP SOLN
1.0000 [drp] | OPHTHALMIC | Status: DC | PRN
  Filled 2021-02-02: qty 15

## 2021-02-02 MED ORDER — ONDANSETRON HCL 4 MG/2ML IJ SOLN
INTRAMUSCULAR | Status: AC
  Filled 2021-02-02: qty 2

## 2021-02-02 MED ORDER — LACTATED RINGERS IV SOLN: INTRAVENOUS | Status: DC

## 2021-02-02 MED ORDER — ONDANSETRON HCL 4 MG/2ML IJ SOLN: 4.0000 mg | Freq: Once | INTRAMUSCULAR | Status: DC | PRN

## 2021-02-02 MED ORDER — LOSARTAN POTASSIUM 50 MG PO TABS
100.0000 mg | ORAL_TABLET | Freq: Every evening | ORAL | Status: DC
  Administered 2021-02-03: 100 mg via ORAL
  Filled 2021-02-02 (×2): qty 2

## 2021-02-02 MED ORDER — LIDOCAINE-EPINEPHRINE 1 %-1:100000 IJ SOLN
INTRAMUSCULAR | Status: AC
  Filled 2021-02-02: qty 1

## 2021-02-02 MED ORDER — MENTHOL 3 MG MT LOZG
1.0000 | LOZENGE | OROMUCOSAL | Status: DC | PRN
  Filled 2021-02-02: qty 9

## 2021-02-02 MED ORDER — BUPIVACAINE HCL (PF) 0.5 % IJ SOLN
INTRAMUSCULAR | Status: AC
  Filled 2021-02-02: qty 30

## 2021-02-02 MED ORDER — FENTANYL CITRATE (PF) 100 MCG/2ML IJ SOLN
INTRAMUSCULAR | Status: DC | PRN
  Administered 2021-02-02 (×2): 50 ug via INTRAVENOUS

## 2021-02-02 MED ORDER — MIDAZOLAM HCL 2 MG/2ML IJ SOLN
INTRAMUSCULAR | Status: DC | PRN
  Administered 2021-02-02 (×2): 1 mg via INTRAVENOUS

## 2021-02-02 MED ORDER — PROPOFOL 10 MG/ML IV BOLUS
INTRAVENOUS | Status: AC
  Filled 2021-02-02: qty 20

## 2021-02-02 MED ORDER — ONDANSETRON HCL 4 MG/2ML IJ SOLN
4.0000 mg | Freq: Four times a day (QID) | INTRAMUSCULAR | Status: DC | PRN
  Administered 2021-02-02: 4 mg via INTRAVENOUS
  Filled 2021-02-02: qty 2

## 2021-02-02 MED ORDER — ONDANSETRON HCL 4 MG PO TABS: 4.0000 mg | ORAL_TABLET | Freq: Four times a day (QID) | ORAL | Status: DC | PRN

## 2021-02-02 MED ORDER — BUPIVACAINE HCL 0.5 % IJ SOLN
INTRAMUSCULAR | Status: DC | PRN
  Administered 2021-02-02: 12 mL

## 2021-02-02 MED ORDER — ACETAMINOPHEN 10 MG/ML IV SOLN
INTRAVENOUS | Status: AC
  Filled 2021-02-02: qty 100

## 2021-02-02 MED ORDER — MIDAZOLAM HCL 2 MG/2ML IJ SOLN
INTRAMUSCULAR | Status: AC
  Filled 2021-02-02: qty 2

## 2021-02-02 MED ORDER — HYDROMORPHONE HCL 1 MG/ML IJ SOLN
1.0000 mg | INTRAMUSCULAR | Status: DC | PRN
  Administered 2021-02-02: 1 mg via INTRAVENOUS
  Filled 2021-02-02: qty 2

## 2021-02-02 MED ORDER — SODIUM CHLORIDE 0.9 % IV SOLN
25.0000 mg | Freq: Once | INTRAVENOUS | Status: AC
  Administered 2021-02-02: 25 mg via INTRAVENOUS
  Filled 2021-02-02: qty 1

## 2021-02-02 MED ORDER — ORAL CARE MOUTH RINSE: 15.0000 mL | Freq: Once | OROMUCOSAL | Status: AC

## 2021-02-02 MED ORDER — FAMOTIDINE 20 MG PO TABS: 20.0000 mg | ORAL_TABLET | Freq: Once | ORAL | Status: DC

## 2021-02-02 MED ORDER — ONDANSETRON HCL 4 MG/2ML IJ SOLN
4.0000 mg | Freq: Once | INTRAMUSCULAR | Status: AC
  Administered 2021-02-02: 4 mg via INTRAVENOUS

## 2021-02-02 MED ORDER — SUGAMMADEX SODIUM 200 MG/2ML IV SOLN
INTRAVENOUS | Status: DC | PRN
  Administered 2021-02-02: 200 mg via INTRAVENOUS

## 2021-02-02 MED ORDER — DOCUSATE SODIUM 100 MG PO CAPS
100.0000 mg | ORAL_CAPSULE | Freq: Two times a day (BID) | ORAL | Status: DC
  Administered 2021-02-03 (×2): 100 mg via ORAL
  Filled 2021-02-02 (×2): qty 1

## 2021-02-02 MED ORDER — SIMETHICONE 80 MG PO CHEW: 80.0000 mg | CHEWABLE_TABLET | Freq: Four times a day (QID) | ORAL | Status: DC | PRN

## 2021-02-02 MED ORDER — METOCLOPRAMIDE HCL 5 MG/ML IJ SOLN
10.0000 mg | Freq: Four times a day (QID) | INTRAMUSCULAR | Status: DC | PRN
  Filled 2021-02-02: qty 2

## 2021-02-02 MED ORDER — INSULIN ASPART 100 UNIT/ML IJ SOLN: 0.0000 [IU] | Freq: Every day | INTRAMUSCULAR | Status: DC

## 2021-02-02 MED ORDER — ADULT MULTIVITAMIN W/MINERALS CH
1.0000 | ORAL_TABLET | Freq: Every day | ORAL | Status: DC
  Administered 2021-02-03: 1 via ORAL
  Filled 2021-02-02 (×2): qty 1

## 2021-02-02 MED ORDER — GABAPENTIN 100 MG PO CAPS: 100.0000 mg | ORAL_CAPSULE | Freq: Every day | ORAL | Status: DC

## 2021-02-02 MED ORDER — POLYETHYL GLYCOL-PROPYL GLYCOL 0.4-0.3 % OP GEL: Freq: Every day | OPHTHALMIC | Status: DC | PRN

## 2021-02-02 MED ORDER — IBUPROFEN 600 MG PO TABS
600.0000 mg | ORAL_TABLET | Freq: Four times a day (QID) | ORAL | Status: DC
  Administered 2021-02-02 – 2021-02-03 (×5): 600 mg via ORAL
  Filled 2021-02-02 (×6): qty 1

## 2021-02-02 MED ORDER — ROSUVASTATIN CALCIUM 5 MG PO TABS
5.0000 mg | ORAL_TABLET | Freq: Every day | ORAL | Status: DC
  Administered 2021-02-03: 5 mg via ORAL
  Filled 2021-02-02: qty 1

## 2021-02-02 MED ORDER — POVIDONE-IODINE 10 % EX SWAB
2.0000 "application " | Freq: Once | CUTANEOUS | Status: AC
  Administered 2021-02-02: 2 via TOPICAL

## 2021-02-02 MED ORDER — FENTANYL CITRATE (PF) 100 MCG/2ML IJ SOLN
INTRAMUSCULAR | Status: AC
  Filled 2021-02-02: qty 2

## 2021-02-02 MED ORDER — DILTIAZEM HCL ER COATED BEADS 240 MG PO CP24
240.0000 mg | ORAL_CAPSULE | Freq: Every day | ORAL | Status: DC
  Administered 2021-02-03: 240 mg via ORAL
  Filled 2021-02-02 (×2): qty 1

## 2021-02-02 MED ORDER — APREPITANT 40 MG PO CAPS
ORAL_CAPSULE | ORAL | Status: AC
  Filled 2021-02-02: qty 1

## 2021-02-02 MED ORDER — LIDOCAINE-EPINEPHRINE 1 %-1:100000 IJ SOLN
INTRAMUSCULAR | Status: DC | PRN
  Administered 2021-02-02: 20 mL
  Administered 2021-02-02: 5 mL
  Administered 2021-02-02: 10 mL

## 2021-02-02 MED ORDER — DEXAMETHASONE SODIUM PHOSPHATE 10 MG/ML IJ SOLN
INTRAMUSCULAR | Status: DC | PRN
  Administered 2021-02-02: 8 mg via INTRAVENOUS

## 2021-02-02 MED ORDER — PHENYLEPHRINE HCL (PRESSORS) 10 MG/ML IV SOLN
INTRAVENOUS | Status: DC | PRN
  Administered 2021-02-02 (×4): 100 ug via INTRAVENOUS

## 2021-02-02 MED ORDER — CHLORHEXIDINE GLUCONATE 0.12 % MT SOLN
15.0000 mL | Freq: Once | OROMUCOSAL | Status: AC
  Administered 2021-02-02: 15 mL via OROMUCOSAL

## 2021-02-02 MED ORDER — PROPOFOL 10 MG/ML IV BOLUS
INTRAVENOUS | Status: DC | PRN
  Administered 2021-02-02: 170 mg via INTRAVENOUS

## 2021-02-02 MED ORDER — ALUM & MAG HYDROXIDE-SIMETH 200-200-20 MG/5ML PO SUSP: 30.0000 mL | ORAL | Status: DC | PRN

## 2021-02-02 MED ORDER — ONDANSETRON HCL 4 MG/2ML IJ SOLN
INTRAMUSCULAR | Status: AC
  Filled 2021-02-02: qty 4

## 2021-02-02 MED ORDER — MONTELUKAST SODIUM 10 MG PO TABS
10.0000 mg | ORAL_TABLET | Freq: Every day | ORAL | Status: DC
  Administered 2021-02-03: 10 mg via ORAL
  Filled 2021-02-02 (×2): qty 1

## 2021-02-02 MED ORDER — EPHEDRINE SULFATE 50 MG/ML IJ SOLN
INTRAMUSCULAR | Status: DC | PRN
  Administered 2021-02-02: 10 mg via INTRAVENOUS

## 2021-02-02 MED ORDER — OXYCODONE HCL 5 MG PO TABS
5.0000 mg | ORAL_TABLET | ORAL | Status: DC | PRN
  Administered 2021-02-02: 10 mg via ORAL
  Filled 2021-02-02: qty 2

## 2021-02-02 MED ORDER — CEFAZOLIN SODIUM-DEXTROSE 2-4 GM/100ML-% IV SOLN
INTRAVENOUS | Status: AC
  Filled 2021-02-02: qty 100

## 2021-02-02 MED ORDER — ROCURONIUM BROMIDE 100 MG/10ML IV SOLN
INTRAVENOUS | Status: DC | PRN
  Administered 2021-02-02 (×2): 20 mg via INTRAVENOUS
  Administered 2021-02-02: 50 mg via INTRAVENOUS

## 2021-02-02 SURGICAL SUPPLY — 95 items
ADH SKN CLS APL DERMABOND .7 (GAUZE/BANDAGES/DRESSINGS) ×2
APL PRP STRL LF DISP 70% ISPRP (MISCELLANEOUS) ×2
APL SRG 38 LTWT LNG FL B (MISCELLANEOUS) ×2
APPLICATOR ARISTA FLEXITIP XL (MISCELLANEOUS) ×3 IMPLANT
BAG DRN RND TRDRP ANRFLXCHMBR (UROLOGICAL SUPPLIES) ×2
BAG SPEC RTRVL LRG 6X4 10 (ENDOMECHANICALS)
BAG URINE DRAIN 2000ML AR STRL (UROLOGICAL SUPPLIES) ×3 IMPLANT
BLADE SURG SZ11 CARB STEEL (BLADE) ×3 IMPLANT
CANISTER SUCT 1200ML W/VALVE (MISCELLANEOUS) ×3 IMPLANT
CATH FOLEY 2WAY  5CC 16FR (CATHETERS) ×3
CATH FOLEY 2WAY 5CC 16FR (CATHETERS) ×2
CATH ROBINSON RED A/P 16FR (CATHETERS) ×2 IMPLANT
CATH URTH 16FR FL 2W BLN LF (CATHETERS) ×2 IMPLANT
CHLORAPREP W/TINT 26 (MISCELLANEOUS) ×3 IMPLANT
CORD MONOPOLAR M/FML 12FT (MISCELLANEOUS) ×2 IMPLANT
COVER WAND RF STERILE (DRAPES) ×3 IMPLANT
DERMABOND ADVANCED (GAUZE/BANDAGES/DRESSINGS) ×1
DERMABOND ADVANCED .7 DNX12 (GAUZE/BANDAGES/DRESSINGS) ×2 IMPLANT
DRAPE CAMERA CLOSED 9X96 (DRAPES) ×1 IMPLANT
DRAPE GENERAL ENDO 106X123.5 (DRAPES) ×3 IMPLANT
DRAPE PERI LITHO V/GYN (MISCELLANEOUS) ×3 IMPLANT
DRAPE STERI POUCH LG 24X46 STR (DRAPES) IMPLANT
DRAPE UNDER BUTTOCK W/FLU (DRAPES) ×3 IMPLANT
ELECT REM PT RETURN 9FT ADLT (ELECTROSURGICAL) ×3
ELECTRODE REM PT RTRN 9FT ADLT (ELECTROSURGICAL) ×2 IMPLANT
ETHIBOND 2 0 GREEN CT 2 30IN (SUTURE) ×5 IMPLANT
GAUZE 4X4 16PLY RFD (DISPOSABLE) ×4 IMPLANT
GAUZE PACK 2X3YD (PACKING) ×3 IMPLANT
GLOVE SURG ENC MOIS LTX SZ7 (GLOVE) ×9 IMPLANT
GLOVE SURG UNDER LTX SZ7.5 (GLOVE) ×9 IMPLANT
GOWN STRL REUS W/ TWL LRG LVL3 (GOWN DISPOSABLE) ×6 IMPLANT
GOWN STRL REUS W/ TWL XL LVL3 (GOWN DISPOSABLE) ×2 IMPLANT
GOWN STRL REUS W/TWL LRG LVL3 (GOWN DISPOSABLE) ×9
GOWN STRL REUS W/TWL XL LVL3 (GOWN DISPOSABLE) ×3
HEMOSTAT ARISTA ABSORB 3G PWDR (HEMOSTASIS) IMPLANT
IRRIGATION STRYKERFLOW (MISCELLANEOUS) ×2 IMPLANT
IRRIGATOR STRYKERFLOW (MISCELLANEOUS)
IV LACTATED RINGERS 1000ML (IV SOLUTION) ×3 IMPLANT
IV NS 1000ML (IV SOLUTION) ×3
IV NS 1000ML BAXH (IV SOLUTION) ×2 IMPLANT
KIT PINK PAD W/HEAD ARE REST (MISCELLANEOUS) ×3
KIT PINK PAD W/HEAD ARM REST (MISCELLANEOUS) ×2 IMPLANT
KIT TURNOVER CYSTO (KITS) ×3 IMPLANT
L-HOOK LAP DISP 36CM (ELECTROSURGICAL)
LABEL OR SOLS (LABEL) ×3 IMPLANT
LHOOK LAP DISP 36CM (ELECTROSURGICAL) IMPLANT
LIGASURE VESSEL 5MM BLUNT TIP (ELECTROSURGICAL) ×3 IMPLANT
MANIFOLD NEPTUNE II (INSTRUMENTS) ×3 IMPLANT
MANIPULATOR VCARE LG CRV RETR (MISCELLANEOUS) IMPLANT
MANIPULATOR VCARE SML CRV RETR (MISCELLANEOUS) IMPLANT
MANIPULATOR VCARE STD CRV RETR (MISCELLANEOUS) IMPLANT
NDL FILTER BLUNT 18X1 1/2 (NEEDLE) ×2 IMPLANT
NEEDLE FILTER BLUNT 18X 1/2SAF (NEEDLE) ×1
NEEDLE FILTER BLUNT 18X1 1/2 (NEEDLE) ×2 IMPLANT
NEEDLE HYPO 22GX1.5 SAFETY (NEEDLE) ×3 IMPLANT
NS IRRIG 500ML POUR BTL (IV SOLUTION) ×3 IMPLANT
OCCLUDER COLPOPNEUMO (BALLOONS) ×2 IMPLANT
PACK BASIN MINOR ARMC (MISCELLANEOUS) ×3 IMPLANT
PACK GYN LAPAROSCOPIC (MISCELLANEOUS) ×3 IMPLANT
PAD OB MATERNITY 4.3X12.25 (PERSONAL CARE ITEMS) ×3 IMPLANT
PAD PREP 24X41 OB/GYN DISP (PERSONAL CARE ITEMS) ×3 IMPLANT
PENCIL ELECTRO HAND CTR (MISCELLANEOUS) IMPLANT
POUCH SPECIMEN RETRIEVAL 10MM (ENDOMECHANICALS) IMPLANT
SCISSORS METZENBAUM CVD 33 (INSTRUMENTS) IMPLANT
SET CYSTO W/LG BORE CLAMP LF (SET/KITS/TRAYS/PACK) ×3 IMPLANT
SET TUBE SMOKE EVAC HIGH FLOW (TUBING) ×2 IMPLANT
SLEEVE ENDOPATH XCEL 5M (ENDOMECHANICALS) ×3 IMPLANT
SOL PREP PROV IODINE SCRUB 4OZ (MISCELLANEOUS) ×3 IMPLANT
SOL PREP PVP 2OZ (MISCELLANEOUS) ×3
SOLUTION PREP PVP 2OZ (MISCELLANEOUS) ×2 IMPLANT
STRIP CLOSURE SKIN 1/2X4 (GAUZE/BANDAGES/DRESSINGS) ×3 IMPLANT
STRIP CLOSURE SKIN 1/4X4 (GAUZE/BANDAGES/DRESSINGS) ×3 IMPLANT
SURGILUBE 2OZ TUBE FLIPTOP (MISCELLANEOUS) ×3 IMPLANT
SUT MNCRL AB 4-0 PS2 18 (SUTURE) ×4 IMPLANT
SUT PDS AB 2-0 CT1 27 (SUTURE) ×2 IMPLANT
SUT VIC AB 0 CT1 27 (SUTURE) ×6
SUT VIC AB 0 CT1 27XCR 8 STRN (SUTURE) ×2 IMPLANT
SUT VIC AB 0 CT1 36 (SUTURE) ×8 IMPLANT
SUT VIC AB 2-0 CT1 36 (SUTURE) ×5 IMPLANT
SUT VIC AB 2-0 SH 27 (SUTURE) ×3
SUT VIC AB 2-0 SH 27XBRD (SUTURE) ×6 IMPLANT
SUT VIC AB 2-0 UR6 27 (SUTURE) ×2 IMPLANT
SUT VIC AB 3-0 SH 27 (SUTURE) ×3
SUT VIC AB 3-0 SH 27X BRD (SUTURE) ×2 IMPLANT
SUT VIC AB 4-0 FS2 27 (SUTURE) ×2 IMPLANT
SUT VIC AB 4-0 SH 27 (SUTURE)
SUT VIC AB 4-0 SH 27XANBCTRL (SUTURE) ×2 IMPLANT
SYR 10ML LL (SYRINGE) ×3 IMPLANT
SYR 50ML LL SCALE MARK (SYRINGE) IMPLANT
SYR 5ML LL (SYRINGE) ×3 IMPLANT
SYR CONTROL 10ML LL (SYRINGE) ×3 IMPLANT
TROCAR ENDO BLADELESS 11MM (ENDOMECHANICALS) ×3 IMPLANT
TROCAR XCEL NON-BLD 5MMX100MML (ENDOMECHANICALS) ×3 IMPLANT
TUBING ART PRESS 48 MALE/FEM (TUBING) IMPLANT
TUBING EVAC SMOKE HEATED PNEUM (TUBING) ×3 IMPLANT

## 2021-02-02 NOTE — Progress Notes (Signed)
Inpatient Diabetes Program Recommendations  AACE/ADA: New Consensus Statement on Inpatient Glycemic Control   Target Ranges:  Prepandial:   less than 140 mg/dL      Peak postprandial:   less than 180 mg/dL (1-2 hours)      Critically ill patients:  140 - 180 mg/dL   Results for Molly, Potter (MRN [id number]) as of 02/02/2021 11:50  Ref. Range 02/02/2021 06:47 02/02/2021 10:48  Glucose-Capillary Latest Ref Range: 70 - 99 mg/dL 175 (H) 206 (H)  Results for Molly, Potter (MRN [id number]) as of 02/02/2021 11:50  Ref. Range 07/23/2020 11:18 01/19/2021 15:23  Hemoglobin A1C Latest Ref Range: 4.0 - 5.6 % 7.2 (H) 7.0 (A)   Review of Glycemic Control  Diabetes history: DM2 Outpatient Diabetes medications: None; diet controlled Current orders for Inpatient glycemic control: CBG monitoring  Inpatient Diabetes Program Recommendations:    Insulin: While inpatient please consider ordering CBGs AC&HS with Novolog 0-15 units TID with meals and Novolog 0-5 units QHS (using Glycemic Control AC/HS order set).  NOTE: Noted consult for assistance with glycemic control post operatively. Chart reviewed. Per chart, patient had Laparoscopic assisted vaginal hysterectomy, bilateral salpingoophorectomy, McCall's culdoplasty, cystoscopy today and received Decadron 8 mg at 8:30 today. Anticipate Decadron to contribute to hyperglycemia. Would recommend order CBGs AC&HS with Novolog 0-15 units TID and Novolog 0-5 units QHS while inpatient. Will follow along while inpatient.  Thanks, Barnie Alderman, RN, MSN, CDE Diabetes Coordinator Inpatient Diabetes Program [phone] (Team Pager from 8am to 5pm)

## 2021-02-02 NOTE — Discharge Instructions (Signed)
For the next three days, take ibuprofen and acetaminophen on a schedule, every 8 hours. You can take them together or you can intersperse them, and take one every four hours. I also gave you gabapentin for nighttime, to help you sleep and also to control pain. Take gabapentin medicines at night for at least the next 3 nights. You also have a narcotic, oxycodone, to take as needed if the above medicines don't help.  Postop constipation is a major cause of pain. Stay well hydrated, walk as you tolerate, and take over the counter senna as well as stool softeners if you need them.   Signs and Symptoms to Report Call our office at [phone] if you have any of the following.  . Fever over 100.4 degrees or higher . Severe stomach pain not relieved with pain medications . Bright red bleeding that's heavier than a period that does not slow with rest . To go the bathroom a lot (frequency), you can't hold your urine (urgency), or it hurts when you empty your bladder (urinate) . Chest pain . Shortness of breath . Pain in the calves of your legs . Severe nausea and vomiting not relieved with anti-nausea medications . Signs of infection around your wounds, such as redness, hot to touch, swelling, green/yellow drainage (like pus), bad smelling discharge . Any concerns  What You Can Expect after Surgery . You may see some pink tinged, bloody fluid and bruising around the wound. This is normal. . You may notice shoulder and neck pain. This is caused by the gas used during surgery to expand your abdomen so your surgeon could get to the uterus easier. . You may have a sore throat because of the tube in your mouth during general anesthesia. This will go away in 2 to 3 days. . You may have some stomach cramps. . You may notice spotting on your panties. . You may have pain around the incision sites.   Activities after Your Discharge Follow these guidelines to help speed your recovery at home: . Do the  coughing and deep breathing as you did in the hospital for 2 weeks. Use the small blue breathing device, called the incentive spirometer for 2 weeks. . Don't drive if you are in pain or taking narcotic pain medicine. You may drive when you can safely slam on the brakes, turn the wheel forcefully, and rotate your torso comfortably. This is typically 1-2 weeks. Practice in a parking lot or side street prior to attempting to drive regularly.  . Ask others to help with household chores for 4 weeks. . Do not lift anything heavier that 10 pounds for 4-6 weeks. This includes pets, children, and groceries. . Don't do strenuous activities, exercises, or sports like vacuuming, tennis, squash, etc. until your doctor says it is safe to do so. ---If you had a hysterectomy (abdominal, laparoscopic, or vaginal) do not have intercourse for 8-10 weeks.  . Walk as you feel able. Rest often since it may take two or three weeks for your energy level to return to normal.  . You may climb stairs . Avoid constipation:   -Eat fruits, vegetables, and whole grains. Eat small meals as your appetite will take time to return to normal.   -Drink 6 to 8 glasses of water each day unless your doctor has told you to limit your fluids.   -Use a laxative or stool softener as needed if constipation becomes a problem. You may take Miralax, metamucil, Citrucil, Colace, Senekot,  FiberCon, etc. If this does not relieve the constipation, try two tablespoons of Milk Of Magnesia every 8 hours until your bowels move.   You may shower. Gently wash the wounds with a mild soap and water. Pat dry.  Do not get in a hot tub, swimming pool, etc. until your doctor agrees.  Do not use lotions, oils, powders on the wounds.  Do not douche, use tampons, or have sex until your doctor says it is okay.  Take your pain medicine when you need it. The medicine may not work as well if the pain is bad.  Take the medicines you were taking before surgery. Other medications you will need  are pain medications (Norco or Percocet) and nausea medications (Zofran).  

## 2021-02-02 NOTE — Interval H&P Note (Signed)
History and Physical Interval Note:  02/02/2021 7:25 AM  Molly Potter  has presented today for surgery, with the diagnosis of Anterior prolapse.  The various methods of treatment have been discussed with the patient and family. After consideration of risks, benefits and other options for treatment, the patient has consented to  Procedure(s): LAPAROSCOPIC ASSISTED VAGINAL HYSTERECTOMY (N/A) ANTERIOR REPAIR (CYSTOCELE), MODIFIED MCCALLS CULDOPLASTY (N/A) POSSIBLE CYSTOSCOPY (N/A) LAPAROSCOPIC BILATERAL SALPINGECTOMY AND POSSIBLE OOPHERECTOMY (Bilateral) as a surgical intervention.  The patient's history has been reviewed, patient examined, no change in status, stable for surgery.  I have reviewed the patient's chart and labs.  Questions were answered to the patient's satisfaction.     Benjaman Kindler

## 2021-02-02 NOTE — Transfer of Care (Signed)
Immediate Anesthesia Transfer of Care Note  Patient: Molly Potter  Procedure(s) Performed: LAPAROSCOPIC ASSISTED VAGINAL HYSTERECTOMY (N/A ) ANTERIOR REPAIR (CYSTOCELE), MODIFIED MCCALLS CULDOPLASTY (N/A ) CYSTOSCOPY (N/A ) LAPAROSCOPIC BILATERAL SALPINGECTOMY AND OOPHERECTOMY (Bilateral )  Patient Location: PACU  Anesthesia Type:General  Level of Consciousness: sedated  Airway & Oxygen Therapy: Patient Spontanous Breathing and Patient connected to face mask oxygen  Post-op Assessment: Report given to RN and Post -op Vital signs reviewed and stable  Post vital signs: Reviewed and stable  Last Vitals:  Vitals Value Taken Time  BP 108/55 02/02/21 1046  Temp    Pulse 66 02/02/21 1050  Resp 12 02/02/21 1050  SpO2 100 % 02/02/21 1050  Vitals shown include unvalidated device data.  Last Pain:  Vitals:   02/02/21 0656  TempSrc:   PainSc: 0-No pain         Complications: No complications documented.

## 2021-02-02 NOTE — Anesthesia Postprocedure Evaluation (Signed)
Anesthesia Post Note  Patient: Molly Potter  Procedure(s) Performed: LAPAROSCOPIC ASSISTED VAGINAL HYSTERECTOMY (N/A ) ANTERIOR REPAIR (CYSTOCELE), MODIFIED MCCALLS CULDOPLASTY (N/A ) CYSTOSCOPY (N/A ) LAPAROSCOPIC BILATERAL SALPINGECTOMY AND OOPHERECTOMY (Bilateral )  Patient location during evaluation: PACU Anesthesia Type: General Level of consciousness: awake and alert Pain management: pain level controlled Vital Signs Assessment: post-procedure vital signs reviewed and stable Respiratory status: spontaneous breathing and respiratory function stable Cardiovascular status: blood pressure returned to baseline and stable Anesthetic complications: no   No complications documented.   Last Vitals:  Vitals:   02/02/21 1150 02/02/21 1200  BP:  (!) 106/54  Pulse: 79 80  Resp: (!) 9 11  Temp:    SpO2: 93% 96%    Last Pain:  Vitals:   02/02/21 1200  TempSrc:   PainSc: Asleep                 Akhil Piscopo K

## 2021-02-02 NOTE — Anesthesia Procedure Notes (Signed)
Procedure Name: Intubation Date/Time: 02/02/2021 7:45 AM Performed by: Allean Found, CRNA Pre-anesthesia Checklist: Patient identified, Patient being monitored, Timeout performed, Emergency Drugs available and Suction available Patient Re-evaluated:Patient Re-evaluated prior to induction Oxygen Delivery Method: Circle system utilized Preoxygenation: Pre-oxygenation with 100% oxygen Induction Type: IV induction Ventilation: Mask ventilation without difficulty Laryngoscope Size: Mac and 3 Grade View: Grade I Tube type: Oral Tube size: 7.0 mm Number of attempts: 1 Airway Equipment and Method: Stylet Placement Confirmation: ETT inserted through vocal cords under direct vision,  positive ETCO2 and breath sounds checked- equal and bilateral Secured at: 21 cm Tube secured with: Tape Dental Injury: Teeth and Oropharynx as per pre-operative assessment

## 2021-02-02 NOTE — Progress Notes (Signed)
Cardizem and cozaar given at scheduled time but was vomited up. This RN witnessed the medications in the emesis. Dr. Leafy Ro made aware. She states for night shift RN to re-administer meds when patient's nausea subsides. She states there is a 24 hr window to give meds, so if they have to be given in the morning that is okay. Night shift RN made aware.   Erlene Quan 7:32 PM 02/02/2021

## 2021-02-02 NOTE — Progress Notes (Signed)
Day of Surgery Procedure(s): LAPAROSCOPIC ASSISTED VAGINAL HYSTERECTOMY (N/A) ANTERIOR REPAIR (CYSTOCELE), MODIFIED MCCALLS CULDOPLASTY (N/A) CYSTOSCOPY (N/A) LAPAROSCOPIC BILATERAL SALPINGECTOMY AND OOPHERECTOMY (Bilateral) Subjective: Pain is adequately controlled. No SOB or CP. Resting quietly. +nausea  Objective: Vital signs in last 24 hours: Temp:  [97.4 F (36.3 C)-97.9 F (36.6 C)] 97.4 F (36.3 C) (05/23 1705) Pulse Rate:  [62-84] 67 (05/23 1705) Resp:  [9-17] 15 (05/23 1231) BP: (104-124)/(51-65) 116/61 (05/23 1705) SpO2:  [90 %-100 %] 98 % (05/23 1705) Weight:  [85.3 kg] 85.3 kg (05/23 0656)  Intake/Output  Intake/Output Summary (Last 24 hours) at 02/02/2021 1732 Last data filed at 02/02/2021 1600 Gross per 24 hour  Intake 5541.77 ml  Output 1170 ml  Net 4371.77 ml    Physical Exam:  General: Alert and oriented. CV: RRR Lungs: Clear bilaterally. GI: Soft, Nondistended. Incisions: Clean and dry. Urine: Clear Extremities: Nontender, no erythema, no edema.  Assessment/Plan: POD# 0 s/p Procedure(s): LAPAROSCOPIC ASSISTED VAGINAL HYSTERECTOMY (N/A) ANTERIOR REPAIR (CYSTOCELE), MODIFIED MCCALLS CULDOPLASTY (N/A) CYSTOSCOPY (N/A) LAPAROSCOPIC BILATERAL SALPINGECTOMY AND OOPHERECTOMY (Bilateral).  - Encourage Incentive spirometry -Advance diet as tolerated, but may do clears overnight -Continue oral pain medication, with IV rescue as needed -OOB to chair for 4 hours this afternoon  - foley and vag packing out in am  Benjaman Kindler, MD   LOS: 1 day   Benjaman Kindler 02/02/2021, 5:32 PM

## 2021-02-02 NOTE — Op Note (Addendum)
[facility] PROCEDURE DATE: 02/02/2021   PREOPERATIVE DIAGNOSIS: Pelvic organ prolapse POSTOPERATIVE DIAGNOSIS: The same PROCEDURE: Laparoscopic assisted vaginal hysterectomy, bilateral salpingoophorectomy, McCall's culdoplasty, cystoscopy  SURGEON:  Dr. Angelina Pih  ASSISTANT: Dr. Gwen Her. Schermerhorn ANESTHESIOLOGIST: No responsible provider has been recorded for the case. Anesthesiologist: Gunnar Fusi, MD CRNA: Aline Brochure, CRNA; Allean Found, CRNA  INDICATIONS: 65 y.o. G4P4 with aforementioned preoperative diagnoses here today for definitive surgical management.   Risks of surgery were discussed with the patient including but not limited to: bleeding which may require transfusion or reoperation; infection which may require antibiotics; injury to bowel, bladder, ureters or other surrounding organs; need for additional procedures including laparotomy; thromboembolic phenomenon, incisional problems and other postoperative/anesthesia complications. Written informed consent was obtained.    FINDINGS:  Small uterus, normal adnexa bilaterally.  No evidence of endometriosis. Small perihepatic adhesions.   ANESTHESIA:    General INTRAVENOUS FLUIDS: 1200 ml ESTIMATED BLOOD LOSS: 20 ml SPECIMENS: Uterus, cervix, bilateral fallopian tubes and ovaries. COMPLICATIONS: None immediate  PROCEDURE IN DETAIL:  The patient received intravenous antibiotics and had sequential compression devices applied to her lower extremities while in the preoperative area.  She was then taken to the operating room where general anesthesia was administered and was found to be adequate.  She was placed in the dorsal lithotomy position, and was prepped and draped in a sterile manner.    A Foley catheter was inserted into her bladder and attached to constant drainage and a uterine manipulator was then advanced into the uterus.  After an adequate timeout was performed, attention was turned to the  abdomen where an umbilical incision was made with the scalpel.  A 5-mm trocar and sleeve were then advanced without difficulty with the laparoscope under direct visualization into the abdomen.  The abdomen was then insufflated with carbon dioxide gas and adequate pneumoperitoneum was obtained. Bilateral 5-mm lower quadrant ports were then placed under direct visualization.  A survey of the patient's pelvis and abdomen revealed the findings as above.  The round ligaments were clamped and transected with the Ligasure device on both sides. The bilateral infundibulopelvic ligaments were also clamped and transected with the Ligasure device.  The bladder peritoneum was dissected away from the vaginal cuff. The uterus was followed to the uterine arteries, which were clamped, cauterized, and transected with the Ligasure device. Excellent hemostasis was noted, the decision was made to leave the trocars in place and proceed with completing the hysterectomy via the vaginal route.  Attention was then turned to her pelvis.  A weighted speculum was then placed in the vagina, and the anterior and posterior lips of the cervix were grasped bilaterally with tenaculums.  The cervix was then injected circumferentially with 0.5% Marcaine with epinephrine solution to maintain hemostasis.  The cervix was then circumferentially incised, and the posterior cul-de-sac was then entered sharply without difficulty and a long weighted speculum was placed.  The same procedure was performed anteriorly and the bladder was identified and sharply dissected off the cervix without difficulty.  The Heaney clamp was then used to clamp the uterosacral ligaments on either side.  They were then cut and sutured ligated with 0 Vicryl, and were held with a tag for later identification. Of note, all sutures used in this case were 0 Vicryl unless otherwise noted.   The cardinal ligaments were then clamped, cut and ligated bilaterally. The uterine vessels and  broad ligaments were then serially clamped with the Heaney clamps, cut, and  suture ligated on both sides.  The uterus, tubes and ovaries were noted to be freed from all ligaments and was then delivered and sent to pathology.  After completion of the hysterectomy, all pedicles from the uterosacral ligament to the cornua were examined hemostasis was confirmed.  A McCall's culdoplasty was performed, incorporating the uterosacral ligaments to the posterior vaginal cuff tightly. The vaginal cuff was then closed in a running locked fashion with 0 Vicryl with care given to incorporate the uterosacral pedicles bilaterally.    Anterior repair (Cystocele repair)  Attention was then turned to the anterior vaginal mucosa that was grasped in the midline approx 1cm proximate to the ureteral opening using an Allis clamp. Two Allis clamps were used to identify the terminus of the incision just distal to the vaginal cuff and injected with 1% local 1:100000 lidocaine with epi. A cross incision was made at the apex. A midline mucosal incision was made from the vaginal apex to the bladder neck, approx 1.5 cm from the urethral opening. The edges of this incision were grasped with a series of Kelly clamps and the vaginal mucosa was dissected off the underlying pubocervical fascia bilaterally using blunt and sharp dissection.  The dissection was taken to the paravaginal spaces bilaterally.  The pubocervical fascia was then plicated in the midline using 2-0 Vicryl sutures from the bladder neck to the bladder base resulting in excellent surgical correction of the cystocele.  Excess vaginal tissue was then excised bilaterally and the incision was then closed with 0 Vicryl in a running suture.  Overall excellent hemostasis was noted. Estrogen cream to placed along the incision and the vagina packed with gauze. A foley catheter was placed.  Attention was then returned to her abdomen which was insufflated again with carbon dioxide gas.   The laparoscope was used to survey the operative site, and it was found to be hemostatic.   No intraoperative injury to other surrounding organs was noted. The bladder was backfilled and noted to be intact. The abdomen was desufflated and all instruments were then removed from the patient's abdomen.   All skin incisions were closed with 3-0 Monocryl and Dermabond. The patient tolerated the procedures well.    A cystoscopy confirmed bladder mucosa integrity and excellent efflux from the bilateral ureteral ostia. All instruments were removed from the vagina.  All instruments, needles, and sponge counts were correct x 2. The patient was taken to the recovery room awake, extubated and in stable condition.

## 2021-02-02 NOTE — Anesthesia Preprocedure Evaluation (Signed)
Anesthesia Evaluation  Patient identified by MRN, date of birth, ID band Patient awake    Reviewed: Allergy & Precautions, NPO status , Patient's Chart, lab work & pertinent test results  History of Anesthesia Complications (+) PONV and history of anesthetic complications  Airway Mallampati: II       Dental   Pulmonary neg sleep apnea, neg COPD, Not current smoker,           Cardiovascular hypertension, Pt. on medications (-) Past MI and (-) CHF (-) dysrhythmias (-) Valvular Problems/Murmurs     Neuro/Psych neg Seizures    GI/Hepatic Neg liver ROS, neg GERD  ,  Endo/Other  diabetes, Type 2Hyperthyroidism (off meds now)   Renal/GU      Musculoskeletal   Abdominal   Peds  Hematology   Anesthesia Other Findings   Reproductive/Obstetrics                             Anesthesia Physical Anesthesia Plan  ASA: III  Anesthesia Plan: General   Post-op Pain Management:    Induction: Intravenous  PONV Risk Score and Plan: 4 or greater and Ondansetron, Aprepitant, Dexamethasone and Midazolam  Airway Management Planned: Oral ETT  Additional Equipment:   Intra-op Plan:   Post-operative Plan:   Informed Consent: I have reviewed the patients History and Physical, chart, labs and discussed the procedure including the risks, benefits and alternatives for the proposed anesthesia with the patient or authorized representative who has indicated his/her understanding and acceptance.       Plan Discussed with:   Anesthesia Plan Comments:         Anesthesia Quick Evaluation

## 2021-02-03 ENCOUNTER — Encounter: Payer: Self-pay | Admitting: Obstetrics and Gynecology

## 2021-02-03 DIAGNOSIS — I10 Essential (primary) hypertension: Secondary | ICD-10-CM | POA: Diagnosis not present

## 2021-02-03 DIAGNOSIS — N819 Female genital prolapse, unspecified: Secondary | ICD-10-CM | POA: Diagnosis not present

## 2021-02-03 DIAGNOSIS — D259 Leiomyoma of uterus, unspecified: Secondary | ICD-10-CM | POA: Diagnosis not present

## 2021-02-03 DIAGNOSIS — N8501 Benign endometrial hyperplasia: Secondary | ICD-10-CM | POA: Diagnosis not present

## 2021-02-03 DIAGNOSIS — Z79899 Other long term (current) drug therapy: Secondary | ICD-10-CM | POA: Diagnosis not present

## 2021-02-03 DIAGNOSIS — N841 Polyp of cervix uteri: Secondary | ICD-10-CM | POA: Diagnosis not present

## 2021-02-03 LAB — BASIC METABOLIC PANEL
Anion gap: 6 (ref 5–15)
BUN: 12 mg/dL (ref 8–23)
CO2: 25 mmol/L (ref 22–32)
Calcium: 8.7 mg/dL — ABNORMAL LOW (ref 8.9–10.3)
Chloride: 107 mmol/L (ref 98–111)
Creatinine, Ser: 0.68 mg/dL (ref 0.44–1.00)
GFR, Estimated: >60 mL/min (ref >60)
Glucose, Bld: 152 mg/dL — ABNORMAL HIGH (ref 70–99)
Potassium: 4.3 mmol/L (ref 3.5–5.1)
Sodium: 138 mmol/L (ref 135–145)

## 2021-02-03 LAB — CBC
HCT: 36.4 % (ref 36.0–46.0)
Hemoglobin: 12.2 g/dL (ref 12.0–15.0)
MCH: 31.7 pg (ref 26.0–34.0)
MCHC: 33.5 g/dL (ref 30.0–36.0)
MCV: 94.5 fL (ref 80.0–100.0)
Platelets: 343 10*3/uL (ref 150–400)
RBC: 3.85 MIL/uL — ABNORMAL LOW (ref 3.87–5.11)
RDW: 12.4 % (ref 11.5–15.5)
WBC: 16 10*3/uL — ABNORMAL HIGH (ref 4.0–10.5)
nRBC: 0 % (ref 0.0–0.2)

## 2021-02-03 LAB — GLUCOSE, CAPILLARY
Glucose-Capillary: 159 mg/dL — ABNORMAL HIGH (ref 70–99)
Glucose-Capillary: 110 mg/dL — ABNORMAL HIGH (ref 70–99)

## 2021-02-03 MED ORDER — OXYCODONE HCL 5 MG PO TABS: 5.0000 mg | ORAL_TABLET | ORAL | 0 refills | Status: DC | PRN

## 2021-02-03 MED ORDER — ACETAMINOPHEN 500 MG PO TABS: 1000.0000 mg | ORAL_TABLET | Freq: Four times a day (QID) | ORAL | 0 refills | Status: AC

## 2021-02-03 MED ORDER — IBUPROFEN 800 MG PO TABS: 800.0000 mg | ORAL_TABLET | Freq: Three times a day (TID) | ORAL | 1 refills | Status: AC

## 2021-02-03 MED ORDER — GABAPENTIN 800 MG PO TABS: 400.0000 mg | ORAL_TABLET | Freq: Every day | ORAL | 0 refills | Status: DC

## 2021-02-03 MED ORDER — DOCUSATE SODIUM 100 MG PO CAPS: 100.0000 mg | ORAL_CAPSULE | Freq: Two times a day (BID) | ORAL | 0 refills | Status: AC

## 2021-02-03 NOTE — Discharge Summary (Signed)
1 Day Post-Op       Procedure(s): LAPAROSCOPIC ASSISTED VAGINAL HYSTERECTOMY (N/A) ANTERIOR REPAIR (CYSTOCELE), MODIFIED MCCALLS CULDOPLASTY (N/A) CYSTOSCOPY (N/A) LAPAROSCOPIC BILATERAL SALPINGECTOMY AND OOPHERECTOMY (Bilateral) Subjective: The patient is doing well.  No nausea or vomiting. Pain is adequately controlled.  Objective: Vital signs in last 24 hours: Temp:  [97.4 F (36.3 C)-98.2 F (36.8 C)] 97.5 F (36.4 C) (05/24 0426) Pulse Rate:  [62-86] 70 (05/24 0426) Resp:  [9-20] 20 (05/24 0426) BP: (94-116)/(49-64) 114/49 (05/24 0426) SpO2:  [90 %-100 %] 96 % (05/24 0700)  Intake/Output  Intake/Output Summary (Last 24 hours) at 02/03/2021 0810 Last data filed at 02/03/2021 0556 Gross per 24 hour  Intake 7064 ml  Output 2595 ml  Net 4469 ml    Physical Exam:  General: Alert and oriented. CV: RRR Lungs: Clear bilaterally. GI: Soft, Nondistended. Incisions: Clean and dry. Urine: Clear, Foley in place Extremities: Nontender, no erythema, no edema.  Lab Results: Recent Labs    02/03/21 0509  HGB 12.2  HCT 36.4  WBC 16.0*  PLT 343                 Results for orders placed or performed during the hospital encounter of 02/02/21 (from the past 24 hour(s))  Glucose, capillary     Status: Abnormal   Collection Time: 02/02/21 10:48 AM  Result Value Ref Range   Glucose-Capillary 206 (H) 70 - 99 mg/dL  Glucose, capillary     Status: Abnormal   Collection Time: 02/02/21  5:46 PM  Result Value Ref Range   Glucose-Capillary 213 (H) 70 - 99 mg/dL  Glucose, capillary     Status: Abnormal   Collection Time: 02/02/21 10:08 PM  Result Value Ref Range   Glucose-Capillary 197 (H) 70 - 99 mg/dL   Comment 1 Notify RN   CBC     Status: Abnormal   Collection Time: 02/03/21  5:09 AM  Result Value Ref Range   WBC 16.0 (H) 4.0 - 10.5 K/uL   RBC 3.85 (L) 3.87 - 5.11 MIL/uL   Hemoglobin 12.2 12.0 - 15.0 g/dL   HCT 36.4 36.0 - 46.0 %   MCV 94.5 80.0 - 100.0 fL   MCH 31.7  26.0 - 34.0 pg   MCHC 33.5 30.0 - 36.0 g/dL   RDW 12.4 11.5 - 15.5 %   Platelets 343 150 - 400 K/uL   nRBC 0.0 0.0 - 0.2 %  Basic metabolic panel     Status: Abnormal   Collection Time: 02/03/21  5:09 AM  Result Value Ref Range   Sodium 138 135 - 145 mmol/L   Potassium 4.3 3.5 - 5.1 mmol/L   Chloride 107 98 - 111 mmol/L   CO2 25 22 - 32 mmol/L   Glucose, Bld 152 (H) 70 - 99 mg/dL   BUN 12 8 - 23 mg/dL   Creatinine, Ser 0.68 0.44 - 1.00 mg/dL   Calcium 8.7 (L) 8.9 - 10.3 mg/dL   GFR, Estimated >60 >60 mL/min   Anion gap 6 5 - 15    Assessment/Plan: 1 Day Post-Op       Procedure(s): LAPAROSCOPIC ASSISTED VAGINAL HYSTERECTOMY (N/A) ANTERIOR REPAIR (CYSTOCELE), MODIFIED MCCALLS CULDOPLASTY (N/A) CYSTOSCOPY (N/A) LAPAROSCOPIC BILATERAL SALPINGECTOMY AND OOPHERECTOMY (Bilateral)  - Foley and packing removed this morning - advance diet (nausea overnight) -Ambulate, Incentive spirometry -Advance diet as tolerated; may discharge home if tolerating regular diet -Transition to oral pain medication -Discharge home today anticipated    Benjaman Kindler, MD  LOS: 1 day   Benjaman Kindler 02/03/2021, 8:10 AM

## 2021-02-03 NOTE — Progress Notes (Signed)
All discharge instructions reviewed with pt by previous nurse; pt's daughter here to take pt home; pt discharged via wheelchair escorted by nurse tech to medical mall; pt going home

## 2021-02-03 NOTE — Progress Notes (Signed)
Patient discharged home. Discharge instructions, prescriptions and follow up appointment given to and reviewed with patient. Patient verbalized understanding.

## 2021-02-04 LAB — SURGICAL PATHOLOGY

## 2021-02-10 ENCOUNTER — Other Ambulatory Visit: Payer: Self-pay | Admitting: Family Medicine

## 2021-02-10 DIAGNOSIS — J301 Allergic rhinitis due to pollen: Secondary | ICD-10-CM

## 2021-02-10 NOTE — Telephone Encounter (Signed)
Requested Prescriptions  Pending Prescriptions Disp Refills  . montelukast (SINGULAIR) 10 MG tablet [Pharmacy Med Name: Montelukast Sodium 10 MG Oral Tablet] 90 tablet 1    Sig: TAKE 1 TABLET BY MOUTH AT BEDTIME FOR  ALLERGIES     Pulmonology:  Leukotriene Inhibitors Passed - 02/10/2021  7:22 PM      Passed - Valid encounter within last 12 months    Recent Outpatient Visits          3 weeks ago Hypertension associated with diabetes [facility])   [facility] [place], Dionne Bucy, MD   5 months ago Chest pain, unspecified type   [facility], Clearnce Sorrel, [place]   6 months ago Annual physical exam   [place], [place]   9 months ago Upper respiratory tract infection, unspecified type   [facility] Birdie Sons, MD   1 year ago Right hip pain   [facility] [place], Clearnce Sorrel, [place]      Future Appointments            In 5 months [place], Dionne Bucy, MD [facility], [place]

## 2021-03-17 ENCOUNTER — Other Ambulatory Visit: Payer: Self-pay | Admitting: Physician Assistant

## 2021-03-17 DIAGNOSIS — I10 Essential (primary) hypertension: Secondary | ICD-10-CM

## 2021-03-20 ENCOUNTER — Encounter: Payer: Self-pay | Admitting: Family Medicine

## 2021-03-20 DIAGNOSIS — I10 Essential (primary) hypertension: Secondary | ICD-10-CM

## 2021-03-20 MED ORDER — DILTIAZEM HCL ER COATED BEADS 240 MG PO CP24: 240.0000 mg | ORAL_CAPSULE | Freq: Every evening | ORAL | 0 refills | Status: DC

## 2021-06-15 ENCOUNTER — Other Ambulatory Visit: Payer: Self-pay | Admitting: Family Medicine

## 2021-06-15 DIAGNOSIS — I10 Essential (primary) hypertension: Secondary | ICD-10-CM

## 2021-06-30 ENCOUNTER — Ambulatory Visit: Payer: BC Managed Care – PPO | Attending: Obstetrics and Gynecology

## 2021-06-30 ENCOUNTER — Other Ambulatory Visit: Payer: Self-pay

## 2021-06-30 DIAGNOSIS — R278 Other lack of coordination: Secondary | ICD-10-CM

## 2021-06-30 DIAGNOSIS — R2689 Other abnormalities of gait and mobility: Secondary | ICD-10-CM | POA: Diagnosis not present

## 2021-06-30 DIAGNOSIS — M533 Sacrococcygeal disorders, not elsewhere classified: Secondary | ICD-10-CM | POA: Diagnosis not present

## 2021-06-30 DIAGNOSIS — M6281 Muscle weakness (generalized): Secondary | ICD-10-CM

## 2021-06-30 DIAGNOSIS — N3946 Mixed incontinence: Secondary | ICD-10-CM

## 2021-06-30 NOTE — Therapy (Addendum)
[place] MAIN [facility] SERVICES [place], [place], [address] Phone: [phone]   Fax:  [phone]  Physical Therapy Evaluation  Patient Details  Name: Molly Potter MRN: [id number] Date of Birth: 1956/08/13 No data recorded  Encounter Date: 06/30/2021   PT End of Session - 06/30/21 0948     Visit Number 1    Number of Visits 10    Date for PT Re-Evaluation 09/08/21    Authorization Type BCBS Medicare: 30 PT/OT combined    Progress Note Due on Visit 10    PT Start Time 0802    PT Stop Time 0857    PT Time Calculation (min) 55 min    Activity Tolerance Patient tolerated treatment well    Behavior During Therapy WFL for tasks assessed/performed             Past Medical History:  Diagnosis Date   Arthritis    Diabetes mellitus without complication ([place])    diet controlled   Fatigue    GERD (gastroesophageal reflux disease)    History of eczema    Hypertension    Hyperthyroidism    Joint pain    PONV (postoperative nausea and vomiting)     Past Surgical History:  Procedure Laterality Date   CYSTOCELE REPAIR N/A 02/02/2021   Procedure: ANTERIOR REPAIR (CYSTOCELE), MODIFIED MCCALLS CULDOPLASTY;  Surgeon: Benjaman Kindler, MD;  Location: [facility] ORS;  Service: Gynecology;  Laterality: N/A;   CYSTOSCOPY N/A 02/02/2021   Procedure: CYSTOSCOPY;  Surgeon: Benjaman Kindler, MD;  Location: [facility] ORS;  Service: Gynecology;  Laterality: N/A;   LAPAROSCOPIC ASSISTED VAGINAL HYSTERECTOMY N/A 02/02/2021   Procedure: LAPAROSCOPIC ASSISTED VAGINAL HYSTERECTOMY;  Surgeon: Benjaman Kindler, MD;  Location: [facility] ORS;  Service: Gynecology;  Laterality: N/A;   LAPAROSCOPIC BILATERAL SALPINGECTOMY Bilateral 02/02/2021   Procedure: LAPAROSCOPIC BILATERAL SALPINGECTOMY AND OOPHERECTOMY;  Surgeon: Benjaman Kindler, MD;  Location: [facility] ORS;  Service: Gynecology;  Laterality: Bilateral;   ROTATOR CUFF REPAIR Right    TUBAL LIGATION      There  were no vitals filed for this visit.    Subjective Assessment - 06/30/21 0811     Subjective Pt reported her bladder was "hanging out" prior to cystocele repair and hysterectomy on 02/02/21. Pt reported improvement s/p surgery but not has soreness. Pt has pain with urination. Pt urinates approx. 2-3x/four hours and pain incr. if she holds urine. Pt reported weak urine stream and it takes awhile to fully empty bladder. Pt gets up 2-3x/night to urinate. Pt had SUI after surgery but it's better now. She occasionally leaks with urge incontinence. Pt uses one pad per day 2/2 leakage. Pt denied bowel leakage but sometimes goes a whole week without bowel movement. Ice cream also helps bowel movement. She uses stool softener or meds to have bowel movement. Pt has tried fruits and more fiber to help incr. frequency of bowel movement. Core: pt has hx of back pain. Sexual function: pt reports pain with intercourse on R side which began about a month ago. Pt describes pain as stabbing during intercourse. Lubricant does not make a difference. 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 Pt cleans bathrooms, cleans spills, prepare foods, take them to MD appt's, schedule appts, go to the grocery store, change adult diapers. Pt likes to walk but the mall floors hurt pelvic floor, so she prefers walking on sidewalks and the street or gravel.    Pertinent History HTN, hyperthyroidism, HLD,  DM, Cx radiculitis, Lx radiculopathy, arthiritis, Potter rotator cuff rupture, OA, cystocele, eczema, cystocele repair and hysterectomy on 02/02/21    Patient Stated Goals "To be stronger (my core needs to be stronger).    Currently in Pain? No/denies                            Objective measurements completed on examination: See above findings.     Pelvic Floor Special Questions - 06/30/21 0826     Are you Pregnant or attempting pregnancy? Yes    Prior Pregnancies Yes    Number of  Pregnancies 4    Number of C-Sections 0    Number of Vaginal Deliveries 4    Any difficulty with labor and deliveries No    Episiotomy Performed Yes    Currently Sexually Active Yes    Is this Painful Yes    History of sexually transmitted disease No    Marinoff Scale pain interrupts completion    Urinary Leakage Yes    How often Approx. once a month now    Pad use 1    Activities that cause leaking With strong urge    Urinary urgency Yes    Fluid intake Pt drinks water throughout the day. Decaf coffee in the morning, tea (half sweet and half unsweet at lunch), gingerale at time. No sugar.    Falling out feeling (prolapse) --   yes prior to surgery               Pelvic Floor Physical Therapy Evaluation and Assessment  Screenings: Red Flags: no Have you had any night sweats? Unexplained weight loss? Saddle anesthesia? Unexplained changes in bowel or bladder changes?   OBJECTIVE  Posture/Observations:  Sitting: rounded shoulders, crosses ankles over knee Standing: incr. Lx spine lordosis, decr. Posterior pelvic tilt even during SLS, B knees flexed in stance   Special tests:  concordant SIJ pain during B FADDIR testing Incr. Postural sway during R and Potter SLS   Range of Motion/Flexibilty:  Spine: limited B sidebending, limited B trunk rotation but no pain reported Hips: B hip IR limited   Strength/MMT:  LE MMT  R knee ext: 4+/5 Potter knee ext: 4/5 B knee flex: 4/5 B ankle DF: 4/5 LE MMT Left Right  Hip flex:  (L2) 4/5 4/5  Hip ext: /5 /5  Hip abd: /5 /5  Hip add: /5 /5  Hip IR /5 /5  Hip ER /5 /5     Abdominal:  Palpation: no TTP Diastasis: 2.5 fingers width at umbilicus  Pelvic Floor External Exam: Introitus Appears:  Skin integrity:  Palpation: Cough: Prolapse visible?: Scar mobility: Through clothing: Ischial tuberosities: Palpation for pelvic floor contraction: able to contract with glute max and adductors firing, otherwise trace not  detected. Coccyx: no TTP or deviation noted.      SELF CARE:  PT Education - 06/30/21 0941     Education Details Pt educated pt on exam findings, POC, duration and frequency. PT educated pt on proper toileting posture for fully emptying bladder and decr. strain during bowel movement. PT educated pt on proper posture when seated, standing, and caring for her clients.    Person(s) Educated Patient    Methods Explanation;Handout;Verbal cues    Comprehension Verbalized understanding;Need further instruction              PT Short Term Goals - 06/30/21 0956       PT SHORT  TERM GOAL #1   Title Pt will be IND in HEP to improve strength, posture, and flexibility in order to improve functional mobility and pain.    Baseline No HEP    Time 4    Period Weeks    Status New    Target Date 07/28/21      PT SHORT TERM GOAL #2   Title Pt will be able to coordinate PFM to decr. urge incontinence and report nocturia 0-1/night to improve QOL.    Baseline 2-3x/week    Time 4    Period Weeks    Status New    Target Date 07/28/21      PT SHORT TERM GOAL #3   Title Pt will demonstrate improve posture, alignment and hip strength surrounding pelvis to decr. pelvic pain during intercourse and job activities.    Baseline Incr. lx spine lordosis, decr. post. pelvic tilt and 3+/5 hip abd. (B)    Time 4    Period Weeks    Status New               PT Long Term Goals - 06/30/21 0959       PT LONG TERM GOAL #1   Title Pt will complete FOTO and write goal as indicated.    Baseline no FOTO    Time 8    Period Weeks    Status New    Target Date 08/25/21      PT LONG TERM GOAL #2   Title Pt will demonstrate proper toileting posture, PFM coordination, and verbalize strategies to incr. bowel frequency to every 3 days to decr. discomfort.    Baseline Once every week with medication    Time 8    Period Weeks    Status New    Target Date 08/25/21      PT LONG TERM GOAL #3   Title Pt  will improve pelvic floor muscle coordination and alignment in order to report 0 episodes of urge incontinence over the last four weeks.    Baseline wears 1 pad/day and leaks at least once a month    Time 10    Period Weeks    Status New    Target Date 09/08/21      PT LONG TERM GOAL #4   Title Pt will demonstrate improved PFM contraction without glute/adductor compensations with TrA engaged during STS txfs in order to care for clients without incr. in LBP and pelvic pain.    Time 10    Period Weeks    Status New                    Plan - 06/30/21 0950     Clinical Impression Statement Pt is a 65y/o female presenting to OP pelvic health PT s/p hysterectomy and cystocele repair. Pt's PMH is significant: HTN, hyperthyroidism, HLD, DM, Cx radiculitis, Lx radiculopathy, arthiritis, Potter rotator cuff rupture, OA, cystocele, eczema, cystocele repair and hysterectomy on 02/02/21. The following impairments were noted upon exam: incr. lx spine lordosis, postural dysfunction, impaired strength (especially hips), decr. ROM (B hip IR and SI joint), incontinence, and pelvic pain. Pt would benefit from skilled PT to improve the deficits listed above in order to improve functional mobility.    Personal Factors and Comorbidities Comorbidity 3+;Age;Profession    Comorbidities HTN, hyperthyroidism, HLD, DM, Cx radiculitis, Lx radiculopathy, arthiritis, Potter rotator cuff rupture, OA, cystocele, eczema, cystocele repair and hysterectomy on 02/02/21    Examination-Activity Limitations Bend;Caring for  Others;Carry;Continence;Locomotion Level;Lift    Examination-Participation Restrictions Cleaning;Laundry;Occupation;Meal Prep    Stability/Clinical Decision Making Stable/Uncomplicated    Clinical Decision Making Low    Rehab Potential Good    PT Frequency 1x / week    PT Duration --   10 weeks   PT Treatment/Interventions ADLs/Self Care Home Management;Gait training;Stair training;Functional mobility  training;Therapeutic activities;Neuromuscular re-education;Balance training;Therapeutic exercise;Patient/family education;Manual techniques;Joint Manipulations    PT Next Visit Plan Initiate HEP (hip abd, stretches), internal exam prn. Review toileting posture.    Consulted and Agree with Plan of Care Patient             Patient will benefit from skilled therapeutic intervention in order to improve the following deficits and impairments:  Abnormal gait, Decreased balance, Hypomobility, Pain, Postural dysfunction, Impaired flexibility, Decreased strength, Decreased range of motion, Decreased coordination  Visit Diagnosis: Mixed incontinence - Plan: PT plan of care cert/re-cert  Muscle weakness (generalized) - Plan: PT plan of care cert/re-cert  Sacrococcygeal disorders, not elsewhere classified - Plan: PT plan of care cert/re-cert  Other abnormalities of gait and mobility - Plan: PT plan of care cert/re-cert  Other lack of coordination - Plan: PT plan of care cert/re-cert     Problem List Patient Active Problem List   Diagnosis Date Noted   Pelvic organ prolapse quantification stage 2 cystocele 02/02/2021   Statin medication declined by patient 07/28/2020   Lumbar radiculopathy 07/21/2020   T2DM (type 2 diabetes mellitus) ([place]) 01/03/2019   Hyperlipidemia associated with type 2 diabetes mellitus ([place]) 06/20/2018   Primary osteoarthritis of both knees 02/16/2017   Impingement syndrome of shoulder 07/02/2015   Complete rotator cuff rupture of left shoulder 07/02/2015   Cervical radiculitis 07/02/2015   Impingement syndrome of both shoulders 07/02/2015   Arthritis sicca 04/09/2015   Hypertension associated with diabetes ([place]) 04/09/2015   H/O eczema 04/09/2015   Hyperthyroidism 04/09/2015   Arthritis 04/09/2015    Molly Potter, PT 06/30/2021, 10:06 AM  [facility] [address] [place], [place],  [address] Phone: [phone]   Fax:  [phone]  Name: Molly Potter MRN: [id number] Date of Birth: Dec 18, 1955  Geoffry Paradise, PT,DPT 06/30/21 10:09 AM Phone: [phone] Fax: [phone]

## 2021-06-30 NOTE — Patient Instructions (Signed)
TOILET POSTURE:  1) Urinate: feet flat, lean forward so forearms are on thighs to fully empty bladder. 2) Bowel movement: elevate feet on Squatty Potty or stool so knees are higher than hips. You can find squatty potty on their website or amazon.

## 2021-07-01 DIAGNOSIS — N930 Postcoital and contact bleeding: Secondary | ICD-10-CM | POA: Diagnosis not present

## 2021-07-08 ENCOUNTER — Telehealth: Payer: Self-pay

## 2021-07-08 NOTE — Telephone Encounter (Signed)
Copied from [place] [id number]. Topic: General - Other >> Jul 08, 2021  1:57 PM Valere Dross wrote: Reason for CRM: Pt called in stating she needed some labs order and get an appt scheduled, before her CPE on 11/17, pt requested if she could get that done before 31'st. Please advise.

## 2021-07-10 ENCOUNTER — Other Ambulatory Visit: Payer: Self-pay

## 2021-07-10 DIAGNOSIS — Z0000 Encounter for general adult medical examination without abnormal findings: Secondary | ICD-10-CM

## 2021-07-10 NOTE — Telephone Encounter (Signed)
Needs A1c, lipids, CMP, CBC, TSH. Only needs them done at least 2-3 days ahead of her visit. Fasting preferred.

## 2021-07-14 ENCOUNTER — Ambulatory Visit: Payer: BC Managed Care – PPO | Attending: Obstetrics and Gynecology

## 2021-07-14 ENCOUNTER — Other Ambulatory Visit: Payer: Self-pay

## 2021-07-14 DIAGNOSIS — M6281 Muscle weakness (generalized): Secondary | ICD-10-CM

## 2021-07-14 DIAGNOSIS — R2689 Other abnormalities of gait and mobility: Secondary | ICD-10-CM | POA: Diagnosis not present

## 2021-07-14 DIAGNOSIS — M533 Sacrococcygeal disorders, not elsewhere classified: Secondary | ICD-10-CM | POA: Diagnosis not present

## 2021-07-14 DIAGNOSIS — R278 Other lack of coordination: Secondary | ICD-10-CM | POA: Diagnosis not present

## 2021-07-14 DIAGNOSIS — N3946 Mixed incontinence: Secondary | ICD-10-CM | POA: Diagnosis not present

## 2021-07-14 NOTE — Patient Instructions (Signed)
Access Code: [email or web address] URL: [email or web address] Date: 07/14/2021 Prepared by: Geoffry Paradise  Exercises Clamshell - 1 x daily - 3 x weekly - 3 sets - 10 reps - 1-2 hold Supine Diaphragmatic Breathing - 3 x daily - 7 x weekly - 1 sets - 5 reps - 2 hold Supine Hip Adductor Stretch - 2-3 x daily - 7 x weekly - 1 sets - 3 reps - 30-60 hold

## 2021-07-14 NOTE — Therapy (Addendum)
[place] MAIN [facility] SERVICES [place], [place], [address] Phone: [phone]   Fax:  [phone]  Physical Therapy Treatment  Patient Details  Name: NANETTE WIRSING MRN: [id number] Date of Birth: 1956/09/06 No data recorded  Encounter Date: 07/14/2021   PT End of Session - 07/14/21 1138     Visit Number 2    Number of Visits 10    Date for PT Re-Evaluation 09/08/21    Authorization Type BCBS Medicare: 30 PT/OT combined    Progress Note Due on Visit 10    PT Start Time 0802    PT Stop Time 0857    PT Time Calculation (min) 55 min    Activity Tolerance Patient tolerated treatment well    Behavior During Therapy WFL for tasks assessed/performed             Past Medical History:  Diagnosis Date   Arthritis    Diabetes mellitus without complication ([place])    diet controlled   Fatigue    GERD (gastroesophageal reflux disease)    History of eczema    Hypertension    Hyperthyroidism    Joint pain    PONV (postoperative nausea and vomiting)     Past Surgical History:  Procedure Laterality Date   CYSTOCELE REPAIR N/A 02/02/2021   Procedure: ANTERIOR REPAIR (CYSTOCELE), MODIFIED MCCALLS CULDOPLASTY;  Surgeon: Benjaman Kindler, MD;  Location: [facility] ORS;  Service: Gynecology;  Laterality: N/A;   CYSTOSCOPY N/A 02/02/2021   Procedure: CYSTOSCOPY;  Surgeon: Benjaman Kindler, MD;  Location: [facility] ORS;  Service: Gynecology;  Laterality: N/A;   LAPAROSCOPIC ASSISTED VAGINAL HYSTERECTOMY N/A 02/02/2021   Procedure: LAPAROSCOPIC ASSISTED VAGINAL HYSTERECTOMY;  Surgeon: Benjaman Kindler, MD;  Location: [facility] ORS;  Service: Gynecology;  Laterality: N/A;   LAPAROSCOPIC BILATERAL SALPINGECTOMY Bilateral 02/02/2021   Procedure: LAPAROSCOPIC BILATERAL SALPINGECTOMY AND OOPHERECTOMY;  Surgeon: Benjaman Kindler, MD;  Location: [facility] ORS;  Service: Gynecology;  Laterality: Bilateral;   ROTATOR CUFF REPAIR Right    TUBAL LIGATION      There  were no vitals filed for this visit.   Subjective Assessment - 07/14/21 0806     Subjective Pt denied changes since last visit. Pt is congested as she didn't take allergy meds this morning and is tired from not having coffee. Pt has been using squatty potty and it makes a difference during bowel movements but proper toileting posture during urination has not made much of a difference.    Pertinent History HTN, hyperthyroidism, HLD, DM, Cx radiculitis, Lx radiculopathy, arthiritis, L rotator cuff rupture, OA, cystocele, eczema, cystocele repair and hysterectomy on 02/02/21    Patient Stated Goals "To be stronger (my core needs to be stronger).    Currently in Pain? No/denies                 NMR: Access Code: [email or web address] URL: [email or web address] Date: 07/14/2021 Prepared by: Geoffry Paradise  Exercises Clamshell - 1 x daily - 3 x weekly - 3 sets - 10 reps - 1-2 hold with yellow band with LLE and no bad with RLE. Supine Diaphragmatic Breathing - 3 x daily - 7 x weekly - 1 sets - 5 reps - 2 hold Supine Hip Adductor Stretch - 2-3 x daily - 7 x weekly - 1 sets - 3 reps - 30-60 hold   Performed with S for safety. Performed with cues and demo for proper technique. PT had pt perform mindfulness technique during breathing to decr. Tension such as,  body scan and repeating a mantra.               [place] Adult PT Treatment/Exercise - 07/14/21 1134       Manual Therapy   Manual Therapy Soft tissue mobilization    Manual therapy comments Pt reported she felt less tension and "more jiggly" in LEs after manual therapy.    Soft tissue mobilization PT performed STM and trigger point release to B hip adductors with pt in supine and LE resting on PT's LE to decr. tension and pain. PT had pt performed contract/relax x3 reps/LE.                   SELF CARE:  PT Education - 07/14/21 1136     Education Details PT initiated HEP. PT reiterated the importanct of  performing proper toileting posture to fully relax pelvic floor and reduce strain, pt reported it is helping bowel movements but not much difference during voiding. PT educated pt on the importance of wearing proper shoes with heel strap/support to decr. toe flexion during swing phase of gait, as this can incr. tension in kinetic chain and pelvic floor musculature (PFM). PT educated pt on mindfulness technique in order to reduce stress and tension, as this can also incr. tension and thus pain in pelvic floor. PT also educated pt that her partner is welcome to come to appt's, if she's comfortable with him attending to discuss other ways of being intimate vs. intercourse as it is painful at this time.    Person(s) Educated Patient    Methods Explanation;Demonstration;Tactile cues;Verbal cues;Handout    Comprehension Returned demonstration;Verbalized understanding;Need further instruction              PT Short Term Goals - 06/30/21 0956       PT SHORT TERM GOAL #1   Title Pt will be IND in HEP to improve strength, posture, and flexibility in order to improve functional mobility and pain.    Baseline No HEP    Time 4    Period Weeks    Status New    Target Date 07/28/21      PT SHORT TERM GOAL #2   Title Pt will be able to coordinate PFM to decr. urge incontinence and report nocturia 0-1/night to improve QOL.    Baseline 2-3x/week    Time 4    Period Weeks    Status New    Target Date 07/28/21      PT SHORT TERM GOAL #3   Title Pt will demonstrate improve posture, alignment and hip strength surrounding pelvis to decr. pelvic pain during intercourse and job activities.    Baseline Incr. lx spine lordosis, decr. post. pelvic tilt and 3+/5 hip abd. (B)    Time 4    Period Weeks    Status New               PT Long Term Goals - 06/30/21 0959       PT LONG TERM GOAL #1   Title Pt will complete FOTO and write goal as indicated.    Baseline no FOTO    Time 8    Period Weeks     Status New    Target Date 08/25/21      PT LONG TERM GOAL #2   Title Pt will demonstrate proper toileting posture, PFM coordination, and verbalize strategies to incr. bowel frequency to every 3 days to decr. discomfort.    Baseline Once every  week with medication    Time 8    Period Weeks    Status New    Target Date 08/25/21      PT LONG TERM GOAL #3   Title Pt will improve pelvic floor muscle coordination and alignment in order to report 0 episodes of urge incontinence over the last four weeks.    Baseline wears 1 pad/day and leaks at least once a month    Time 10    Period Weeks    Status New    Target Date 09/08/21      PT LONG TERM GOAL #4   Title Pt will demonstrate improved PFM contraction without glute/adductor compensations with TrA engaged during STS txfs in order to care for clients without incr. in LBP and pelvic pain.    Time 10    Period Weeks    Status New                   Plan - 07/14/21 1138     Clinical Impression Statement Pt demonstrated progress in session, as she reported less tension in BLEs and hips after manual therapy and NMR. Pt continues to experience incr. tension in hips and LEs, which can lead to incr. tension and pain in the pelvic floor musculature. Pt noted to experience less gait deviations at end of session and stride length improved. Pt continues to experience incontinence, pelvic pain, decr. strength,decr. flexibility, and postural dysfunction. Therefore, pt would continue to benefit from skilled PT to address all impairments listed above.    Personal Factors and Comorbidities Comorbidity 3+;Age;Profession    Comorbidities HTN, hyperthyroidism, HLD, DM, Cx radiculitis, Lx radiculopathy, arthiritis, L rotator cuff rupture, OA, cystocele, eczema, cystocele repair and hysterectomy on 02/02/21    Examination-Activity Limitations Bend;Caring for Others;Carry;Continence;Locomotion Level;Lift    Examination-Participation Restrictions  Cleaning;Laundry;Occupation;Meal Prep    Stability/Clinical Decision Making Stable/Uncomplicated    Rehab Potential Good    PT Frequency 1x / week    PT Duration --   10 weeks   PT Treatment/Interventions ADLs/Self Care Home Management;Gait training;Stair training;Functional mobility training;Therapeutic activities;Neuromuscular re-education;Balance training;Therapeutic exercise;Patient/family education;Manual techniques;Joint Manipulations    PT Next Visit Plan FOTO. Review HEP and progress prn. internal exam prn. Manual therapy hips, add prn.    Consulted and Agree with Plan of Care Patient             Patient will benefit from skilled therapeutic intervention in order to improve the following deficits and impairments:  Abnormal gait, Decreased balance, Hypomobility, Pain, Postural dysfunction, Impaired flexibility, Decreased strength, Decreased range of motion, Decreased coordination  Visit Diagnosis: Mixed incontinence  Muscle weakness (generalized)  Other lack of coordination  Other abnormalities of gait and mobility     Problem List Patient Active Problem List   Diagnosis Date Noted   Pelvic organ prolapse quantification stage 2 cystocele 02/02/2021   Statin medication declined by patient 07/28/2020   Lumbar radiculopathy 07/21/2020   T2DM (type 2 diabetes mellitus) ([place]) 01/03/2019   Hyperlipidemia associated with type 2 diabetes mellitus ([place]) 06/20/2018   Primary osteoarthritis of both knees 02/16/2017   Impingement syndrome of shoulder 07/02/2015   Complete rotator cuff rupture of left shoulder 07/02/2015   Cervical radiculitis 07/02/2015   Impingement syndrome of both shoulders 07/02/2015   Arthritis sicca 04/09/2015   Hypertension associated with diabetes ([place]) 04/09/2015   H/O eczema 04/09/2015   Hyperthyroidism 04/09/2015   Arthritis 04/09/2015    Joycie Aerts L, PT 07/14/2021, 11:47 AM  [facility]  [place] MAIN [facility]  SERVICES [address] [place], [place], [address] Phone: [phone]   Fax:  [phone]  Name: ALYSE KATHAN MRN: [id number] Date of Birth: 12-Jun-1956   Geoffry Paradise, PT,DPT 07/14/21 11:47 AM Phone: [phone] Fax: [phone]

## 2021-07-15 ENCOUNTER — Other Ambulatory Visit: Payer: Self-pay | Admitting: Family Medicine

## 2021-07-15 DIAGNOSIS — Z1231 Encounter for screening mammogram for malignant neoplasm of breast: Secondary | ICD-10-CM

## 2021-07-17 NOTE — Progress Notes (Signed)
Annual Wellness Visit     Patient: Molly Potter, Female    DOB: Feb 06, 1956, 65 y.o.   MRN: [id number] Visit Date: 07/20/2021  Today's Provider: Lavon Paganini, MD   Chief Complaint  Patient presents with   Medicare Wellness   Subjective    Molly Potter is a 65 y.o. female who presents today for her Annual Wellness Visit. She reports consuming a general diet. The patient has a physically strenuous job, but has no regular exercise apart from work.  She generally feels well. She reports sleeping well. She does not have additional problems to discuss today.   HPI 07/05/18 Cologuard 08/05/21 Mammogram scheduled  Medications: Outpatient Medications Prior to Visit  Medication Sig   acetaminophen (TYLENOL) 500 MG tablet Take 1,000 mg by mouth every 6 (six) hours as needed for moderate pain.   cholecalciferol (VITAMIN D3) 25 MCG (1000 UNIT) tablet Take 1,000 Units by mouth daily.   cyclobenzaprine (FLEXERIL) 5 MG tablet TAKE 1 TABLET(5 MG) BY MOUTH AT BEDTIME (Patient taking differently: Take 5 mg by mouth daily as needed for muscle spasms.)   diclofenac Sodium (VOLTAREN) 1 % GEL Apply 2 g topically daily as needed (pain).   diltiazem (CARDIZEM CD) 240 MG 24 hr capsule TAKE 1 CAPSULE BY MOUTH IN THE EVENING   docusate sodium (COLACE) 100 MG capsule Take 1 capsule (100 mg total) by mouth 2 (two) times daily. To keep stools soft   glucose blood (CONTOUR NEXT TEST) test strip To check BG BID   loratadine (CLARITIN) 10 MG tablet Take 10 mg by mouth daily as needed for allergies.   losartan (COZAAR) 50 MG tablet Take 2 tablets (100 mg total) by mouth daily. (Patient taking differently: Take 100 mg by mouth every evening.)   Microlet Lancets MISC To check BG BID   montelukast (SINGULAIR) 10 MG tablet TAKE 1 TABLET BY MOUTH AT BEDTIME FOR  ALLERGIES   Multiple Vitamin (MULTIVITAMIN) tablet Take 1 tablet by mouth daily.   Polyethyl Glycol-Propyl Glycol (SYSTANE OP) Place 1 drop into  both eyes daily as needed (dry/irritated eyes).   rosuvastatin (CRESTOR) 5 MG tablet Take 1 tablet (5 mg total) by mouth daily.   [DISCONTINUED] gabapentin (NEURONTIN) 800 MG tablet Take 0.5 tablets (400 mg total) by mouth at bedtime for 14 days. Take nightly for 3 days, then up to 14 days as needed   [DISCONTINUED] oxyCODONE (OXY IR/ROXICODONE) 5 MG immediate release tablet Take 1 tablet (5 mg total) by mouth every 4 (four) hours as needed for severe pain.   No facility-administered medications prior to visit.    Allergies  Allergen Reactions   Effexor [Venlafaxine] Anaphylaxis   Hylan G-F 20 Other (See Comments)    Myalgias, hand pain, back pain; Benadryl helped pain Other reaction(s): Other Myalgias, hand pain, back pain; Benadryl helped pain Burning sensation all over body   Diazepam     Other reaction(s): Unknown   Tramadol Nausea Only   Methylprednisolone Nausea Only and Other (See Comments)    Headaches and chest pain    Patient Care Team: Gwyneth Sprout, FNP as PCP - General (Family Medicine)  Review of Systems  HENT:  Positive for congestion.   All other systems reviewed and are negative.  Last CBC Lab Results  Component Value Date   WBC 16.0 (H) 02/03/2021   HGB 12.2 02/03/2021   HCT 36.4 02/03/2021   MCV 94.5 02/03/2021   MCH 31.7 02/03/2021   RDW 12.4  02/03/2021   PLT 343 69/48/5462   Last metabolic panel Lab Results  Component Value Date   GLUCOSE 152 (H) 02/03/2021   NA 138 02/03/2021   K 4.3 02/03/2021   CL 107 02/03/2021   CO2 25 02/03/2021   BUN 12 02/03/2021   CREATININE 0.68 02/03/2021   GFRNONAA >60 02/03/2021   CALCIUM 8.7 (L) 02/03/2021   PHOS 3.9 05/28/2015   PROT 6.9 01/19/2021   ALBUMIN 4.3 01/19/2021   LABGLOB 2.6 01/19/2021   AGRATIO 1.7 01/19/2021   BILITOT <0.2 01/19/2021   ALKPHOS 89 01/19/2021   AST 18 01/19/2021   ALT 13 01/19/2021   ANIONGAP 6 02/03/2021   Last lipids Lab Results  Component Value Date   CHOL 174  01/19/2021   HDL 47 01/19/2021   LDLCALC 106 (H) 01/19/2021   TRIG 118 01/19/2021   CHOLHDL 3.7 01/19/2021   Last hemoglobin A1c Lab Results  Component Value Date   HGBA1C 7.0 (A) 01/19/2021   Last thyroid functions Lab Results  Component Value Date   TSH 2.240 01/19/2021        Objective    Vitals: BP 138/76 (BP Location: Left Arm, Patient Position: Sitting, Cuff Size: Large)   Pulse 66   Temp 98.1 F (36.7 C) (Temporal)   Resp 16   Ht 5\' 5"  (1.651 m)   Wt 190 lb 14.4 oz (86.6 kg)   SpO2 99%   BMI 31.77 kg/m  BP Readings from Last 3 Encounters:  07/20/21 138/76  02/03/21 136/66  01/19/21 (!) 112/58   Wt Readings from Last 3 Encounters:  07/20/21 190 lb 14.4 oz (86.6 kg)  02/02/21 188 lb 0.8 oz (85.3 kg)  01/28/21 188 lb (85.3 kg)      Physical Exam Vitals reviewed.  Constitutional:      General: She is not in acute distress.    Appearance: Normal appearance. She is well-developed. She is not diaphoretic.  HENT:     Head: Normocephalic and atraumatic.  Eyes:     General: No scleral icterus.    Conjunctiva/sclera: Conjunctivae normal.  Neck:     Thyroid: No thyromegaly.  Cardiovascular:     Rate and Rhythm: Normal rate and regular rhythm.     Pulses: Normal pulses.     Heart sounds: Normal heart sounds. No murmur heard. Pulmonary:     Effort: Pulmonary effort is normal. No respiratory distress.     Breath sounds: Normal breath sounds. No wheezing, rhonchi or rales.  Musculoskeletal:     Cervical back: Neck supple.     Right lower leg: No edema.     Left lower leg: No edema.  Lymphadenopathy:     Cervical: No cervical adenopathy.  Skin:    General: Skin is warm and dry.     Findings: No rash.  Neurological:     Mental Status: She is alert and oriented to person, place, and time. Mental status is at baseline.  Psychiatric:        Mood and Affect: Mood normal.        Behavior: Behavior normal.    Diabetic Foot Exam - Simple   Simple Foot  Form Diabetic Foot exam was performed with the following findings: Yes 07/20/2021  9:17 AM  Visual Inspection No deformities, no ulcerations, no other skin breakdown bilaterally: Yes Sensation Testing Intact to touch and monofilament testing bilaterally: Yes Pulse Check Posterior Tibialis and Dorsalis pulse intact bilaterally: Yes Comments      Most recent functional  status assessment: In your present state of health, do you have any difficulty performing the following activities: 07/20/2021  Hearing? N  Vision? N  Difficulty concentrating or making decisions? N  Walking or climbing stairs? Y  Dressing or bathing? N  Doing errands, shopping? N  Some recent data might be hidden   Most recent fall risk assessment: Fall Risk  07/20/2021  Falls in the past year? 0  Comment -  Number falls in past yr: 0  Injury with Fall? 0  Risk for fall due to : No Fall Risks  Follow up Falls evaluation completed    Most recent depression screenings: PHQ 2/9 Scores 07/20/2021 07/21/2020  PHQ - 2 Score 0 0  PHQ- 9 Score 2 -   Most recent cognitive screening: No flowsheet data found. Most recent Audit-C alcohol use screening Alcohol Use Disorder Test (AUDIT) 07/20/2021  1. How often do you have a drink containing alcohol? 1  2. How many drinks containing alcohol do you have on a typical day when you are drinking? 0  3. How often do you have six or more drinks on one occasion? 0  AUDIT-C Score 1  Alcohol Brief Interventions/Follow-up -   A score of 3 or more in women, and 4 or more in men indicates increased risk for alcohol abuse, EXCEPT if all of the points are from question 1   No results found for any visits on 07/20/21.  Assessment & Plan     Annual wellness visit done today including the all of the following: Reviewed patient's Family Medical History Reviewed and updated list of patient's medical providers Assessment of cognitive impairment was done Assessed patient's functional  ability Established a written schedule for health screening [place] Completed and Reviewed  Exercise Activities and Dietary recommendations  Goals   None     Immunization History  Administered Date(s) Administered   Influenza Inj Mdck Quad Pf 09/19/2018   Influenza,inj,Quad PF,6+ Mos 07/03/2014, 06/29/2019   Influenza-Unspecified 06/27/2020, 06/26/2021   Moderna Sars-Covid-2 Vaccination 12/13/2019, 01/10/2020   Tdap 06/29/2019   Zoster, Live 11/08/2011    Health Maintenance  Topic Date Due   Pneumonia Vaccine 82+ Years old (1 - PCV) Never done   OPHTHALMOLOGY EXAM  Never done   Zoster Vaccines- Shingrix (1 of 2) Never done   COVID-19 Vaccine (3 - Booster for Moderna series) 03/06/2020   MAMMOGRAM  06/02/2020   DEXA SCAN  Never done   Fecal DNA (Cologuard)  07/05/2021   HEMOGLOBIN A1C  07/22/2021   FOOT EXAM  07/20/2022   PAP SMEAR-Modifier  06/21/2023   TETANUS/TDAP  06/28/2029   INFLUENZA VACCINE  Completed   Hepatitis C Screening  Completed   HPV VACCINES  Aged Out     Discussed health benefits of physical activity, and encouraged her to engage in regular exercise appropriate for her age and condition.    Problem List Items Addressed This Visit   None Visit Diagnoses     Welcome to Medicare preventive visit    -  Primary   Relevant Orders   EKG 12-Lead (Completed)   Colon cancer screening       Relevant Orders   Cologuard   Postmenopause       Relevant Orders   DG Bone Density        Return in about 2 months (around 09/19/2021) for chronic disease f/u.     I, Lavon Paganini, MD, have reviewed all documentation for this visit. The  documentation on 07/20/21 for the exam, diagnosis, procedures, and orders are all accurate and complete.   Kailynne Ferrington, Dionne Bucy, MD, MPH [facility]

## 2021-07-20 ENCOUNTER — Other Ambulatory Visit: Payer: Self-pay | Admitting: Family Medicine

## 2021-07-20 ENCOUNTER — Encounter: Payer: Self-pay | Admitting: Family Medicine

## 2021-07-20 ENCOUNTER — Other Ambulatory Visit: Payer: Self-pay

## 2021-07-20 ENCOUNTER — Ambulatory Visit (INDEPENDENT_AMBULATORY_CARE_PROVIDER_SITE_OTHER): Payer: PPO | Admitting: Family Medicine

## 2021-07-20 VITALS — BP 138/76 | HR 66 | Temp 98.1°F | Resp 16 | Ht 65.0 in | Wt 190.9 lb

## 2021-07-20 DIAGNOSIS — Z111 Encounter for screening for respiratory tuberculosis: Secondary | ICD-10-CM

## 2021-07-20 DIAGNOSIS — Z23 Encounter for immunization: Secondary | ICD-10-CM

## 2021-07-20 DIAGNOSIS — Z0000 Encounter for general adult medical examination without abnormal findings: Secondary | ICD-10-CM | POA: Diagnosis not present

## 2021-07-20 DIAGNOSIS — Z124 Encounter for screening for malignant neoplasm of cervix: Secondary | ICD-10-CM

## 2021-07-20 DIAGNOSIS — Z780 Asymptomatic menopausal state: Secondary | ICD-10-CM

## 2021-07-20 DIAGNOSIS — Z1211 Encounter for screening for malignant neoplasm of colon: Secondary | ICD-10-CM | POA: Diagnosis not present

## 2021-07-20 DIAGNOSIS — Z1231 Encounter for screening mammogram for malignant neoplasm of breast: Secondary | ICD-10-CM

## 2021-07-20 NOTE — Patient Instructions (Addendum)
Ask at the pharmacy about the COVID booster  The CDC recommends two doses of Shingrix (the shingles vaccine) separated by 2 to 6 months for adults age 65 years and older. I recommend checking with your insurance plan regarding coverage for this vaccine.    We will schedule you to come back for a pneumonia shot

## 2021-07-21 ENCOUNTER — Telehealth: Payer: Self-pay

## 2021-07-21 ENCOUNTER — Ambulatory Visit: Payer: BC Managed Care – PPO

## 2021-07-21 ENCOUNTER — Other Ambulatory Visit: Payer: Self-pay

## 2021-07-21 DIAGNOSIS — R278 Other lack of coordination: Secondary | ICD-10-CM | POA: Diagnosis not present

## 2021-07-21 DIAGNOSIS — R2689 Other abnormalities of gait and mobility: Secondary | ICD-10-CM | POA: Diagnosis not present

## 2021-07-21 DIAGNOSIS — N3946 Mixed incontinence: Secondary | ICD-10-CM | POA: Diagnosis not present

## 2021-07-21 DIAGNOSIS — M533 Sacrococcygeal disorders, not elsewhere classified: Secondary | ICD-10-CM

## 2021-07-21 DIAGNOSIS — M6281 Muscle weakness (generalized): Secondary | ICD-10-CM | POA: Diagnosis not present

## 2021-07-21 NOTE — Telephone Encounter (Signed)
Noted  

## 2021-07-21 NOTE — Telephone Encounter (Signed)
-----   Message from Virginia Crews, MD sent at 07/21/2021  8:50 AM EST ----- Normal/stable labs, except for significant increase in A1c.  Diabetes is uncontrolled.  Recommend starting Metformin 500mg  twice daily  (CMAs, please send in medication) and rechecking level at next visit with new PCP.  TB test pending.

## 2021-07-21 NOTE — Telephone Encounter (Signed)
Patient advised of lab results. She did request to hold off on starting metformin. She reports joining [employer] and will work on healthy lifestyle changes.

## 2021-07-21 NOTE — Therapy (Signed)
[place] MAIN [facility] SERVICES [address] [place], [place], [address] Phone: [phone]   Fax:  [phone]  Physical Therapy Treatment  Patient Details  Name: Molly Potter MRN: [id number] Date of Birth: 1956/04/12 No data recorded  Encounter Date: 07/21/2021   PT End of Session - 07/21/21 0858     Visit Number 3    Number of Visits 10    Date for PT Re-Evaluation 09/08/21    Authorization Type BCBS Medicare: 30 PT/OT combined    Progress Note Due on Visit 10    PT Start Time 0802    PT Stop Time 0856    PT Time Calculation (min) 54 min    Activity Tolerance Patient tolerated treatment well    Behavior During Therapy WFL for tasks assessed/performed             Past Medical History:  Diagnosis Date   Arthritis    Diabetes mellitus without complication ([place])    diet controlled   Fatigue    GERD (gastroesophageal reflux disease)    History of eczema    Hypertension    Hyperthyroidism    Joint pain    PONV (postoperative nausea and vomiting)     Past Surgical History:  Procedure Laterality Date   ABDOMINAL HYSTERECTOMY  01/2021   CYSTOCELE REPAIR N/A 02/02/2021   Procedure: ANTERIOR REPAIR (CYSTOCELE), MODIFIED MCCALLS CULDOPLASTY;  Surgeon: Benjaman Kindler, MD;  Location: [facility] ORS;  Service: Gynecology;  Laterality: N/A;   CYSTOSCOPY N/A 02/02/2021   Procedure: CYSTOSCOPY;  Surgeon: Benjaman Kindler, MD;  Location: [facility] ORS;  Service: Gynecology;  Laterality: N/A;   LAPAROSCOPIC ASSISTED VAGINAL HYSTERECTOMY N/A 02/02/2021   Procedure: LAPAROSCOPIC ASSISTED VAGINAL HYSTERECTOMY;  Surgeon: Benjaman Kindler, MD;  Location: [facility] ORS;  Service: Gynecology;  Laterality: N/A;   LAPAROSCOPIC BILATERAL SALPINGECTOMY Bilateral 02/02/2021   Procedure: LAPAROSCOPIC BILATERAL SALPINGECTOMY AND OOPHERECTOMY;  Surgeon: Benjaman Kindler, MD;  Location: [facility] ORS;  Service: Gynecology;  Laterality: Bilateral;   ROTATOR CUFF REPAIR  Right    TUBAL LIGATION      There were no vitals filed for this visit.   Subjective Assessment - 07/21/21 0806     Subjective Pt reported she has been leaking more during coughing. Pt reported some catching in her hips during clamshell exercises. She's also having difficulty performing breathing exercises. Pt has been catching herself crossing her legs when she's seated. Pt has been experiencing cramping in B calves three night this week and she believes she was drinking enough water.    Pertinent History HTN, hyperthyroidism, HLD, DM, Cx radiculitis, Lx radiculopathy, arthiritis, L rotator cuff rupture, OA, cystocele, eczema, cystocele repair and hysterectomy on 02/02/21    Patient Stated Goals "To be stronger (my core needs to be stronger).    Currently in Pain? No/denies                  NMR: Access Code: [email or web address] URL: [email or web address] Date: 07/21/2021 Prepared by: Geoffry Paradise  Exercises Clamshell - 1 x daily - 3 x weekly - 3 sets - 10 reps - 1-2 hold Supine Diaphragmatic Breathing - 3 x daily - 7 x weekly - 1 sets - 5 reps - 2 hold, x 4 reps with and without PFM contraction during exhale. Supine Hip Adductor Stretch - 2-3 x daily - 7 x weekly - 1 sets - 3 reps - 30-60 hold Supine Piriformis Stretch with Leg Straight - 1 x daily - 7 x weekly -  1 sets - 3 reps - 30-60 hold  Performed with demo and cues for proper technique. Performed with S for safety.             [place] Adult PT Treatment/Exercise - 07/21/21 0848       Manual Therapy   Manual Therapy Soft tissue mobilization    Manual therapy comments Pt reported she felt more relaxed "I could go to sleep". Pt able to coordinate PFM contraction with breath easier after manual therapy.    Soft tissue mobilization PT performed trigger point release to B diaphragm after pt reported "catching in back and ribs" during diaphragmatic breathing and pt had difficulty performing with PFM contraction  prior to manual therapy.                     PT Education - 07/21/21 0850     Education Details PT reviewed and modified HEP as needed. PT educated pt on proper breathing with PFM contraction (on the exhale) and explained exhale with PFM contraction during sneezing, coughing, and lifting 2/2 intraabdominal pressure system. PT educated pt not to perform PFM contraction daily right now 2/2 incr. tension in pelvic floor at this time.    Person(s) Educated Patient    Methods Explanation;Demonstration;Tactile cues;Verbal cues;Handout    Comprehension Returned demonstration;Verbalized understanding;Need further instruction              PT Short Term Goals - 06/30/21 0956       PT SHORT TERM GOAL #1   Title Pt will be IND in HEP to improve strength, posture, and flexibility in order to improve functional mobility and pain.    Baseline No HEP    Time 4    Period Weeks    Status New    Target Date 07/28/21      PT SHORT TERM GOAL #2   Title Pt will be able to coordinate PFM to decr. urge incontinence and report nocturia 0-1/night to improve QOL.    Baseline 2-3x/week    Time 4    Period Weeks    Status New    Target Date 07/28/21      PT SHORT TERM GOAL #3   Title Pt will demonstrate improve posture, alignment and hip strength surrounding pelvis to decr. pelvic pain during intercourse and job activities.    Baseline Incr. lx spine lordosis, decr. post. pelvic tilt and 3+/5 hip abd. (B)    Time 4    Period Weeks    Status New               PT Long Term Goals - 06/30/21 0959       PT LONG TERM GOAL #1   Title Pt will complete FOTO and write goal as indicated.    Baseline no FOTO    Time 8    Period Weeks    Status New    Target Date 08/25/21      PT LONG TERM GOAL #2   Title Pt will demonstrate proper toileting posture, PFM coordination, and verbalize strategies to incr. bowel frequency to every 3 days to decr. discomfort.    Baseline Once every week  with medication    Time 8    Period Weeks    Status New    Target Date 08/25/21      PT LONG TERM GOAL #3   Title Pt will improve pelvic floor muscle coordination and alignment in order to report 0 episodes of urge  incontinence over the last four weeks.    Baseline wears 1 pad/day and leaks at least once a month    Time 10    Period Weeks    Status New    Target Date 09/08/21      PT LONG TERM GOAL #4   Title Pt will demonstrate improved PFM contraction without glute/adductor compensations with TrA engaged during STS txfs in order to care for clients without incr. in LBP and pelvic pain.    Time 10    Period Weeks    Status New                   Plan - 07/21/21 0858     Clinical Impression Statement Today's skilled session focused on reviewing HEP to ensure proper technique and to decr. compensatory strategies 2/2 improper technique. Pt noted to have difficulty performing pelvic floor muscle contraction (PFM) during exhale vs. inhale 2/2 poor coordination. PT encouraging pt to only perform PFM contraction during coughing, sneezing, lifting as PFM musculature is painful and likely tense 2/2 pain, as pt reported incr. SUI. PT will focus on alignment, strength, and posture prior and decr. PFM tension prior to performing PFM daily to not incr. pain. Pt would continue to improve deficits listed above.    Personal Factors and Comorbidities Comorbidity 3+;Age;Profession    Comorbidities HTN, hyperthyroidism, HLD, DM, Cx radiculitis, Lx radiculopathy, arthiritis, L rotator cuff rupture, OA, cystocele, eczema, cystocele repair and hysterectomy on 02/02/21    Examination-Activity Limitations [place];Caring for Others;Carry;Continence;Locomotion Level;Lift    Examination-Participation Restrictions Cleaning;Laundry;Occupation;Meal Prep    Stability/Clinical Decision Making Stable/Uncomplicated    Rehab Potential Good    PT Frequency 1x / week    PT Duration --   10 weeks   PT  Treatment/Interventions ADLs/Self Care Home Management;Gait training;Stair training;Functional mobility training;Therapeutic activities;Neuromuscular re-education;Balance training;Therapeutic exercise;Patient/family education;Manual techniques;Joint Manipulations    PT Next Visit Plan FOTO. Review HEP and progress prn. internal exam prn. Manual therapy hips, add prn.    Consulted and Agree with Plan of Care Patient             Patient will benefit from skilled therapeutic intervention in order to improve the following deficits and impairments:  Abnormal gait, Decreased balance, Hypomobility, Pain, Postural dysfunction, Impaired flexibility, Decreased strength, Decreased range of motion, Decreased coordination  Visit Diagnosis: Mixed incontinence  Muscle weakness (generalized)  Other lack of coordination  Other abnormalities of gait and mobility  Sacrococcygeal disorders, not elsewhere classified     Problem List Patient Active Problem List   Diagnosis Date Noted   Pelvic organ prolapse quantification stage 2 cystocele 02/02/2021   Statin medication declined by patient 07/28/2020   Lumbar radiculopathy 07/21/2020   T2DM (type 2 diabetes mellitus) ([place]) 01/03/2019   Hyperlipidemia associated with type 2 diabetes mellitus ([place]) 06/20/2018   Primary osteoarthritis of both knees 02/16/2017   Impingement syndrome of shoulder 07/02/2015   Complete rotator cuff rupture of left shoulder 07/02/2015   Cervical radiculitis 07/02/2015   Impingement syndrome of both shoulders 07/02/2015   Arthritis sicca 04/09/2015   Hypertension associated with diabetes ([place]) 04/09/2015   H/O eczema 04/09/2015   Hyperthyroidism 04/09/2015   Arthritis 04/09/2015    Aleni Andrus L, PT 07/21/2021, 9:02 AM  [place] MAIN [facility] SERVICES [address] [place], [place], [address] Phone: [phone]   Fax:  [phone]  Name: Molly Potter MRN:  [id number] Date of Birth: [birth year]   Geoffry Paradise, PT,DPT  07/21/21 9:03 AM Phone: [phone] Fax: [phone]

## 2021-07-21 NOTE — Patient Instructions (Signed)
Access Code: [email or web address] URL: [email or web address] Date: 07/21/2021 Prepared by: Geoffry Paradise  Exercises Clamshell - 1 x daily - 3 x weekly - 3 sets - 10 reps - 1-2 hold Supine Diaphragmatic Breathing - 3 x daily - 7 x weekly - 1 sets - 5 reps - 2 hold Supine Hip Adductor Stretch - 2-3 x daily - 7 x weekly - 1 sets - 3 reps - 30-60 hold Supine Piriformis Stretch with Leg Straight - 1 x daily - 7 x weekly - 1 sets - 3 reps - 30-60 hold

## 2021-07-26 LAB — CBC
Hematocrit: 39.6 % (ref 34.0–46.6)
Hemoglobin: 13.7 g/dL (ref 11.1–15.9)
MCH: 31.8 pg (ref 26.6–33.0)
MCHC: 34.6 g/dL (ref 31.5–35.7)
MCV: 92 fL (ref 79–97)
Platelets: 399 10*3/uL (ref 150–450)
RBC: 4.31 x10E6/uL (ref 3.77–5.28)
RDW: 11.5 % — ABNORMAL LOW (ref 11.7–15.4)
WBC: 9 10*3/uL (ref 3.4–10.8)

## 2021-07-26 LAB — QUANTIFERON-TB GOLD PLUS
QuantiFERON Mitogen Value: 5.49 IU/mL
QuantiFERON Nil Value: 0.01 IU/mL
QuantiFERON TB1 Ag Value: 0.01 IU/mL
QuantiFERON TB2 Ag Value: 0.01 IU/mL
QuantiFERON-TB Gold Plus: NEGATIVE

## 2021-07-26 LAB — COMPREHENSIVE METABOLIC PANEL
ALT: 12 IU/L (ref 0–32)
AST: 13 IU/L (ref 0–40)
Albumin/Globulin Ratio: 1.9 (ref 1.2–2.2)
Albumin: 4.6 g/dL (ref 3.8–4.8)
Alkaline Phosphatase: 100 IU/L (ref 44–121)
BUN/Creatinine Ratio: 12 (ref 12–28)
BUN: 10 mg/dL (ref 8–27)
Bilirubin Total: 0.3 mg/dL (ref 0.0–1.2)
CO2: 24 mmol/L (ref 20–29)
Calcium: 9.6 mg/dL (ref 8.7–10.3)
Chloride: 104 mmol/L (ref 96–106)
Creatinine, Ser: 0.81 mg/dL (ref 0.57–1.00)
Globulin, Total: 2.4 g/dL (ref 1.5–4.5)
Glucose: 173 mg/dL — ABNORMAL HIGH (ref 70–99)
Potassium: 4.5 mmol/L (ref 3.5–5.2)
Sodium: 141 mmol/L (ref 134–144)
Total Protein: 7 g/dL (ref 6.0–8.5)
eGFR: 81 mL/min/{1.73_m2} (ref >59)

## 2021-07-26 LAB — LIPID PANEL
Chol/HDL Ratio: 3.1 ratio (ref 0.0–4.4)
Cholesterol, Total: 142 mg/dL (ref 100–199)
HDL: 46 mg/dL (ref >39)
LDL Chol Calc (NIH): 72 mg/dL (ref 0–99)
Triglycerides: 137 mg/dL (ref 0–149)
VLDL Cholesterol Cal: 24 mg/dL (ref 5–40)

## 2021-07-26 LAB — HEMOGLOBIN A1C
Est. average glucose Bld gHb Est-mCnc: 194 mg/dL
Hgb A1c MFr Bld: 8.4 % — ABNORMAL HIGH (ref 4.8–5.6)

## 2021-07-26 LAB — TSH: TSH: 1.94 u[IU]/mL (ref 0.450–4.500)

## 2021-07-27 ENCOUNTER — Ambulatory Visit: Payer: BC Managed Care – PPO

## 2021-07-30 ENCOUNTER — Ambulatory Visit: Payer: Self-pay | Admitting: Family Medicine

## 2021-08-04 ENCOUNTER — Ambulatory Visit: Payer: BC Managed Care – PPO

## 2021-08-04 ENCOUNTER — Other Ambulatory Visit: Payer: Self-pay

## 2021-08-04 DIAGNOSIS — R278 Other lack of coordination: Secondary | ICD-10-CM

## 2021-08-04 DIAGNOSIS — M533 Sacrococcygeal disorders, not elsewhere classified: Secondary | ICD-10-CM

## 2021-08-04 DIAGNOSIS — R2689 Other abnormalities of gait and mobility: Secondary | ICD-10-CM

## 2021-08-04 DIAGNOSIS — M6281 Muscle weakness (generalized): Secondary | ICD-10-CM

## 2021-08-04 DIAGNOSIS — N3946 Mixed incontinence: Secondary | ICD-10-CM

## 2021-08-04 DIAGNOSIS — Z1211 Encounter for screening for malignant neoplasm of colon: Secondary | ICD-10-CM | POA: Diagnosis not present

## 2021-08-04 NOTE — Therapy (Signed)
[place] MAIN [facility] SERVICES [address] [place], [place], [address] Phone: [phone]   Fax:  [phone]  Physical Therapy Treatment  Patient Details  Name: Molly Potter MRN: [id number] Date of Birth: 11/04/1955 No data recorded  Encounter Date: 08/04/2021   PT End of Session - 08/04/21 0859     Visit Number 4    Number of Visits 10    Date for PT Re-Evaluation 09/08/21    Authorization Type BCBS Medicare: 30 PT/OT combined    Progress Note Due on Visit 10    PT Start Time 0805    PT Stop Time 0903    PT Time Calculation (min) 58 min    Activity Tolerance Patient tolerated treatment well    Behavior During Therapy WFL for tasks assessed/performed             Past Medical History:  Diagnosis Date   Arthritis    Diabetes mellitus without complication ([place])    diet controlled   Fatigue    GERD (gastroesophageal reflux disease)    History of eczema    Hypertension    Hyperthyroidism    Joint pain    PONV (postoperative nausea and vomiting)     Past Surgical History:  Procedure Laterality Date   ABDOMINAL HYSTERECTOMY  01/2021   CYSTOCELE REPAIR N/A 02/02/2021   Procedure: ANTERIOR REPAIR (CYSTOCELE), MODIFIED MCCALLS CULDOPLASTY;  Surgeon: Benjaman Kindler, MD;  Location: [facility] ORS;  Service: Gynecology;  Laterality: N/A;   CYSTOSCOPY N/A 02/02/2021   Procedure: CYSTOSCOPY;  Surgeon: Benjaman Kindler, MD;  Location: [facility] ORS;  Service: Gynecology;  Laterality: N/A;   LAPAROSCOPIC ASSISTED VAGINAL HYSTERECTOMY N/A 02/02/2021   Procedure: LAPAROSCOPIC ASSISTED VAGINAL HYSTERECTOMY;  Surgeon: Benjaman Kindler, MD;  Location: [facility] ORS;  Service: Gynecology;  Laterality: N/A;   LAPAROSCOPIC BILATERAL SALPINGECTOMY Bilateral 02/02/2021   Procedure: LAPAROSCOPIC BILATERAL SALPINGECTOMY AND OOPHERECTOMY;  Surgeon: Benjaman Kindler, MD;  Location: [facility] ORS;  Service: Gynecology;  Laterality: Bilateral;   ROTATOR CUFF REPAIR  Right    TUBAL LIGATION      There were no vitals filed for this visit.   Subjective Assessment - 08/04/21 0808     Subjective Pt reported she's been performing HEP and breathing exercises have been getting easier. Pt reported no more cramps in her calves. Pt reported approx. 50% improvement on GROC with pelvic pain and pressure since starting PT. Breathing exercises have been assisting in decr. pain during intercourse.    Pertinent History HTN, hyperthyroidism, HLD, DM, Cx radiculitis, Lx radiculopathy, arthiritis, L rotator cuff rupture, OA, cystocele, eczema, cystocele repair and hysterectomy on 02/02/21    Patient Stated Goals "To be stronger (my core needs to be stronger).    Currently in Pain? Yes    Pain Score 3     Pain Location Back    Pain Orientation Lower    Pain Descriptors / Indicators Tightness    Pain Type Chronic pain    Pain Onset More than a month ago    Pain Frequency Intermittent    Aggravating Factors  unsure    Pain Relieving Factors relaxing             NMR: Access Code: [email or web address] URL: [email or web address] Date: 08/04/2021 Prepared by: Geoffry Paradise  Exercises Clamshell - 1 x daily - 3 x weekly - 3 sets - 10 reps - 1-2 hold Supine Diaphragmatic Breathing - 3 x daily - 7 x weekly - 1 sets - 5 reps -  2 hold Supine Hip Adductor Stretch - 2-3 x daily - 7 x weekly - 1 sets - 3 reps - 30-60 hold Supine Piriformis Stretch with Leg Straight - 1 x daily - 7 x weekly - 1 sets - 3 reps - 30-60 hold Supine Pelvic Floor Contraction - 1 x daily - 7 x weekly - 1 sets - 10 reps Supine Pelvic Tilt - 1 x daily - 7 x weekly - 1 sets - 10 reps  Pt performed each HEP and PT progressed to PFM contraction and pelvic tilts. Extensive cues and demo required for proper technique of new activities and rest in between reps to ensure proper technique.    FOTO at end of session: PFDI Prolapse: 25  PFDI Bowel: 13  Urinary Problem: 51 (60 d/c  goal)  Bowel Cnst: 41(54 d/c goal)  PFDI Urinary: 17  PFDI Pain: 25                     SELF CARE:  PT Education - 08/04/21 0858     Education Details PT reviewed HEP and progressed as indicated. PT educated pt on STG progress and that the focus was on reducing pain and tension in pelvic floor and we will still work on that, but now add in strengthening for pelvic floor muscles (PFM) to reduce incontinence and pressure.    Person(s) Educated Patient    Methods Explanation;Demonstration;Tactile cues;Verbal cues;Handout    Comprehension Returned demonstration;Verbalized understanding;Need further instruction              PT Short Term Goals - 08/04/21 9935       PT SHORT TERM GOAL #1   Title Pt will be IND in HEP to improve strength, posture, and flexibility in order to improve functional mobility and pain.    Baseline No HEP    Time 4    Period Weeks    Status Achieved    Target Date 07/28/21      PT SHORT TERM GOAL #2   Title Pt will be able to coordinate PFM to decr. urge incontinence and report nocturia 0-1/night to improve QOL.    Baseline 2-3x/week; 11/22: she feels sometimes better, sometimes worse as she is now mindful of it-approx. several a times week, nocturia 2-3 times per night    Time 4    Period Weeks    Status Not Met    Target Date 07/28/21      PT SHORT TERM GOAL #3   Title Pt will demonstrate improve posture, alignment and hip strength surrounding pelvis to decr. pelvic pain during intercourse and job activities.    Baseline Incr. lx spine lordosis, decr. post. pelvic tilt and 3+/5 hip abd. (B), 11/22: Lx spine lordosis still present but somewhat improved, pelvic tilt improved and B hip abd: 4-/5    Time 4    Period Weeks    Status Partially Met               PT Long Term Goals - 08/04/21 1002       PT LONG TERM GOAL #1   Title Pt will complete FOTO and write goal as indicated.    Baseline no FOTO    Time 8    Period  Weeks    Status Achieved      PT LONG TERM GOAL #2   Title Pt will demonstrate proper toileting posture, PFM coordination, and verbalize strategies to incr. bowel frequency to every 3 days to  decr. discomfort.    Baseline Once every week with medication    Time 8    Period Weeks    Status New      PT LONG TERM GOAL #3   Title Pt will improve pelvic floor muscle coordination and alignment in order to report 0 episodes of urge incontinence over the last four weeks.    Baseline wears 1 pad/day and leaks at least once a month    Time 10    Period Weeks    Status New      PT LONG TERM GOAL #4   Title Pt will demonstrate improved PFM contraction without glute/adductor compensations with TrA engaged during STS txfs in order to care for clients without incr. in LBP and pelvic pain.    Time 10    Period Weeks    Status New      PT LONG TERM GOAL #5   Title Pt will improve FOTO score to >/=60 on urinary problems and >/=54 on bowel constipation sections to improve QOL.    Baseline 51: urinary promblem and 61: bowel constipation.    Time 10    Period Weeks    Status New                   Plan - 08/04/21 0859     Clinical Impression Statement Pt demonstrated progress as she met STG 1, partially met STG 3. Pt did not meet STG 2, as she continues to experience urge incontinence and nocturia. However, the focus of therapy has been reducing pain and tension in pelvic floor musculature (PFM) and surrounding mucles and now we will also address strengthening of PFM. Pt required extensive cues for coordination of PFM contraction and pelvic tilts but was able to progress to correct technique with demo and cues. Incr. postural sway noted during postural assessment, especially during R SLS. Pt continues to exhibit incr. lx spine lordosis, ant.pelvic tilt and hypomobility of tx spine (incr. tension in B paraspinals). Pt would continue to benefit from skilled PT to improve incontinence, pain,  strength, balance and posture.    Comorbidities HTN, hyperthyroidism, HLD, DM, Cx radiculitis, Lx radiculopathy, arthiritis, L rotator cuff rupture, OA, cystocele, eczema, cystocele repair and hysterectomy on 02/02/21    PT Treatment/Interventions ADLs/Self Care Home Management;Gait training;Stair training;Functional mobility training;Therapeutic activities;Neuromuscular re-education;Balance training;Therapeutic exercise;Patient/family education;Manual techniques;Joint Manipulations    PT Next Visit Plan Review HEP and progress prn. internal exam prn. Manual therapy hips, add prn.             Patient will benefit from skilled therapeutic intervention in order to improve the following deficits and impairments:     Visit Diagnosis: Mixed incontinence  Muscle weakness (generalized)  Other lack of coordination  Other abnormalities of gait and mobility  Sacrococcygeal disorders, not elsewhere classified     Problem List Patient Active Problem List   Diagnosis Date Noted   Pelvic organ prolapse quantification stage 2 cystocele 02/02/2021   Statin medication declined by patient 07/28/2020   Lumbar radiculopathy 07/21/2020   T2DM (type 2 diabetes mellitus) ([place]) 01/03/2019   Hyperlipidemia associated with type 2 diabetes mellitus ([place]) 06/20/2018   Primary osteoarthritis of both knees 02/16/2017   Impingement syndrome of shoulder 07/02/2015   Complete rotator cuff rupture of left shoulder 07/02/2015   Cervical radiculitis 07/02/2015   Impingement syndrome of both shoulders 07/02/2015   Arthritis sicca 04/09/2015   Hypertension associated with diabetes ([place]) 04/09/2015   H/O eczema 04/09/2015  Hyperthyroidism 04/09/2015   Arthritis 04/09/2015    Betha Shadix L, PT 08/04/2021, 10:04 AM  [place] MAIN [facility] SERVICES [address] [place], [place], [address] Phone: [phone]   Fax:  [phone]  Name: Molly Potter MRN:  [id number] Date of Birth: [birth year]   Geoffry Paradise, PT,DPT 08/04/21 10:04 AM Phone: [phone] Fax: [phone]

## 2021-08-04 NOTE — Patient Instructions (Signed)
Access Code: [email or web address] URL: [email or web address] Date: 08/04/2021 Prepared by: Geoffry Paradise  Exercises Clamshell - 1 x daily - 3 x weekly - 3 sets - 10 reps - 1-2 hold Supine Diaphragmatic Breathing - 3 x daily - 7 x weekly - 1 sets - 5 reps - 2 hold Supine Hip Adductor Stretch - 2-3 x daily - 7 x weekly - 1 sets - 3 reps - 30-60 hold Supine Piriformis Stretch with Leg Straight - 1 x daily - 7 x weekly - 1 sets - 3 reps - 30-60 hold Supine Pelvic Floor Contraction - 1 x daily - 7 x weekly - 1 sets - 10 reps Supine Pelvic Tilt - 1 x daily - 7 x weekly - 1 sets - 10 reps

## 2021-08-10 LAB — COLOGUARD: COLOGUARD: NEGATIVE

## 2021-08-11 ENCOUNTER — Other Ambulatory Visit: Payer: Self-pay | Admitting: Family Medicine

## 2021-08-11 DIAGNOSIS — J301 Allergic rhinitis due to pollen: Secondary | ICD-10-CM

## 2021-08-12 NOTE — Telephone Encounter (Signed)
Requested Prescriptions  Pending Prescriptions Disp Refills  . montelukast (SINGULAIR) 10 MG tablet [Pharmacy Med Name: Montelukast Sodium 10 MG Oral Tablet] 90 tablet 0    Sig: TAKE 1 TABLET BY MOUTH AT BEDTIME FOR ALLERGIES     Pulmonology:  Leukotriene Inhibitors Passed - 08/11/2021  8:25 AM      Passed - Valid encounter within last 12 months    Recent Outpatient Visits          3 weeks ago Welcome to [employer] preventive visit   [facility], Dionne Bucy, MD   6 months ago Hypertension associated with diabetes [facility])   [facility], Dionne Bucy, MD   11 months ago Chest pain, unspecified type   [facility], Clearnce Sorrel, [place]   1 year ago Annual physical exam   [place], [place]   1 year ago Upper respiratory tract infection, unspecified type   [facility] Birdie Sons, MD      Future Appointments            In 1 month Rollene Rotunda Jaci Standard, [place], [place]

## 2021-08-17 ENCOUNTER — Other Ambulatory Visit: Payer: Self-pay

## 2021-08-17 ENCOUNTER — Ambulatory Visit
Admission: RE | Admit: 2021-08-17 | Discharge: 2021-08-17 | Disposition: A | Discharge status: Home / Self Care | Payer: BC Managed Care – PPO | Source: Ambulatory Visit | Attending: Family Medicine | Admitting: Family Medicine

## 2021-08-17 DIAGNOSIS — Z1231 Encounter for screening mammogram for malignant neoplasm of breast: Secondary | ICD-10-CM | POA: Insufficient documentation

## 2021-08-17 DIAGNOSIS — Z780 Asymptomatic menopausal state: Secondary | ICD-10-CM | POA: Diagnosis not present

## 2021-08-18 ENCOUNTER — Other Ambulatory Visit: Payer: Self-pay

## 2021-08-18 ENCOUNTER — Ambulatory Visit: Payer: BC Managed Care – PPO | Attending: Obstetrics and Gynecology

## 2021-08-18 DIAGNOSIS — R278 Other lack of coordination: Secondary | ICD-10-CM | POA: Insufficient documentation

## 2021-08-18 DIAGNOSIS — M533 Sacrococcygeal disorders, not elsewhere classified: Secondary | ICD-10-CM | POA: Diagnosis not present

## 2021-08-18 DIAGNOSIS — R2689 Other abnormalities of gait and mobility: Secondary | ICD-10-CM | POA: Insufficient documentation

## 2021-08-18 DIAGNOSIS — N3946 Mixed incontinence: Secondary | ICD-10-CM | POA: Insufficient documentation

## 2021-08-18 DIAGNOSIS — M6281 Muscle weakness (generalized): Secondary | ICD-10-CM | POA: Diagnosis not present

## 2021-08-18 NOTE — Patient Instructions (Signed)
Access Code: [email or web address] URL: [email or web address] Date: 08/18/2021 Prepared by: Geoffry Paradise  Supine Diaphragmatic Breathing - 3 x daily - 7 x weekly - 1 sets - 5 reps - 2 hold Happy Baby with Pelvic Floor Lengthening - 1 x daily - 7 x weekly - 1 sets - 3 reps - 30-45 hold Supine Lower Trunk Rotation - 1 x daily - 7 x weekly - 1 sets - 3 reps - 30 hold  1) Relaxation: perform diaphragmatic breathing while imagining a place you feel relaxed (beach, prayer room). 2) Prep for exam: lie down, dressed, with sheet covering you. Take sheet off, then back on 5 times, focusing on breath and how you feel. Progress, to taking pants off (covered with sheet) and repeat. Progress then to pants and underwear off (covered with sheet) and repeat. Try talking with partner in bed, with sheet on and clothes (no intercourse expected) and remove sheet and then put sheet back on progressing to no underwear or pants as tolerated.  3) Massage legs with foam roller or hands (along side of legs and perpendicular to IT band).

## 2021-08-18 NOTE — Therapy (Signed)
[place] MAIN [facility] SERVICES [address] [place], [place], [address] Phone: [phone]   Fax:  [phone]  Physical Therapy Treatment  Patient Details  Name: Molly Potter MRN: [id number] Date of Birth: 22-Dec-1955 No data recorded  Encounter Date: 08/18/2021   PT End of Session - 08/18/21 1256     Visit Number 5    Number of Visits 10    Date for PT Re-Evaluation 09/08/21    Authorization Type BCBS Medicare: 30 PT/OT combined    Authorization - Visit Number 5    Authorization - Number of Visits 30    Progress Note Due on Visit 10    PT Start Time 0804    PT Stop Time 0859    PT Time Calculation (min) 55 min    Activity Tolerance Patient tolerated treatment well    Behavior During Therapy WFL for tasks assessed/performed             Past Medical History:  Diagnosis Date   Arthritis    Diabetes mellitus without complication ([place])    diet controlled   Fatigue    GERD (gastroesophageal reflux disease)    History of eczema    Hypertension    Hyperthyroidism    Joint pain    PONV (postoperative nausea and vomiting)     Past Surgical History:  Procedure Laterality Date   ABDOMINAL HYSTERECTOMY  01/2021   CYSTOCELE REPAIR N/A 02/02/2021   Procedure: ANTERIOR REPAIR (CYSTOCELE), MODIFIED MCCALLS CULDOPLASTY;  Surgeon: Benjaman Kindler, MD;  Location: [facility] ORS;  Service: Gynecology;  Laterality: N/A;   CYSTOSCOPY N/A 02/02/2021   Procedure: CYSTOSCOPY;  Surgeon: Benjaman Kindler, MD;  Location: [facility] ORS;  Service: Gynecology;  Laterality: N/A;   LAPAROSCOPIC ASSISTED VAGINAL HYSTERECTOMY N/A 02/02/2021   Procedure: LAPAROSCOPIC ASSISTED VAGINAL HYSTERECTOMY;  Surgeon: Benjaman Kindler, MD;  Location: [facility] ORS;  Service: Gynecology;  Laterality: N/A;   LAPAROSCOPIC BILATERAL SALPINGECTOMY Bilateral 02/02/2021   Procedure: LAPAROSCOPIC BILATERAL SALPINGECTOMY AND OOPHERECTOMY;  Surgeon: Benjaman Kindler, MD;  Location: [facility]  ORS;  Service: Gynecology;  Laterality: Bilateral;   ROTATOR CUFF REPAIR Right    TUBAL LIGATION      There were no vitals filed for this visit.   Subjective Assessment - 08/18/21 0806     Subjective Pt reported she got her PNA shot last Friday and her Dexa scan and mammogram yesterday. She feels like HEP is helping her but she has to take a break sometimes 2/2 LE soreness. Pt feels like pelvic pain is getting better, and so is her hip/LE strength as she's able to stand on one LE for longer periods of time and traverse steps in step over pattern vs. step to pattern. Pt states B knee flex in stance has been going on for years.    Pertinent History HTN, hyperthyroidism, HLD, DM, Cx radiculitis, Lx radiculopathy, arthiritis, Potter rotator cuff rupture, OA, cystocele, eczema, cystocele repair and hysterectomy on 02/02/21    Patient Stated Goals "To be stronger (my core needs to be stronger).    Currently in Pain? No/denies                NMR: Access Code: [email or web address] URL: [email or web address] Date: 08/18/2021 Prepared by: Geoffry Paradise  Exercises  Supine Diaphragmatic Breathing - 3 x daily - 7 x weekly - 1 sets - 5 reps - 2 hold added modification Supine Pelvic Tilt - 1 x daily - 7 x weekly - 1 sets - 10 reps Happy  Baby with Pelvic Floor Lengthening - 1 x daily - 7 x weekly - 1 sets - 3 reps - 30-45 hold Supine Lower Trunk Rotation - 1 x daily - 7 x weekly - 1 sets - 3 reps - 30 hold  Performed with S for safety, cues and demo for proper technique. Added to HEP.                [facility] Adult PT Treatment/Exercise - 08/18/21 1259       Manual Therapy   Manual Therapy Soft tissue mobilization    Manual therapy comments Pt reported decr. tension after manual therapy.    Soft tissue mobilization PT performed STM to B IT band and lateral quad musculature to decr. tension.                   SELF CARE:  PT Education - 08/18/21 1255     Education  Details PT discussed the importance of having a conversation with spouse about the importance of arousal to decr. pelvic pain during intercourse and decr. tension in pelvic floor. PT provided pt with additional relaxation activities and ways to decr. pelvic floor tension during GYN/PT exams and intercourse. 1) Relaxation: perform diaphragmatic breathing while imagining a place you feel relaxed (beach, prayer room).  2) Prep for exam: lie down, dressed, with sheet covering you. Take sheet off, then back on 5 times, focusing on breath and how you feel. Progress, to taking pants off (covered with sheet) and repeat. Progress then to pants and underwear off (covered with sheet) and repeat. Try talking with partner in bed, with sheet on and clothes (no intercourse expected) and remove sheet and then put sheet back on progressing to no underwear or pants as tolerated.   3) Massage legs with foam roller or hands (along side of legs and perpendicular to IT band). PT explained how inability to fully extend B knees in supine or stance can contribute to incr. tension, poor balance and incr. pain in pelvic floor and hips/back/LEs.    Person(s) Educated Patient    Methods Explanation;Demonstration;Tactile cues;Verbal cues;Handout    Comprehension Returned demonstration;Need further instruction;Verbalized understanding              PT Short Term Goals - 08/04/21 4174       PT SHORT TERM GOAL #1   Title Pt will be IND in HEP to improve strength, posture, and flexibility in order to improve functional mobility and pain.    Baseline No HEP    Time 4    Period Weeks    Status Achieved    Target Date 07/28/21      PT SHORT TERM GOAL #2   Title Pt will be able to coordinate PFM to decr. urge incontinence and report nocturia 0-1/night to improve QOL.    Baseline 2-3x/week; 11/22: she feels sometimes better, sometimes worse as she is now mindful of it-approx. several a times week, nocturia 2-3 times per night     Time 4    Period Weeks    Status Not Met    Target Date 07/28/21      PT SHORT TERM GOAL #3   Title Pt will demonstrate improve posture, alignment and hip strength surrounding pelvis to decr. pelvic pain during intercourse and job activities.    Baseline Incr. lx spine lordosis, decr. post. pelvic tilt and 3+/5 hip abd. (B), 11/22: Lx spine lordosis still present but somewhat improved, pelvic tilt improved and B hip abd:  4-/5    Time 4    Period Weeks    Status Partially Met               PT Long Term Goals - 08/04/21 1002       PT LONG TERM GOAL #1   Title Pt will complete FOTO and write goal as indicated.    Baseline no FOTO    Time 8    Period Weeks    Status Achieved      PT LONG TERM GOAL #2   Title Pt will demonstrate proper toileting posture, PFM coordination, and verbalize strategies to incr. bowel frequency to every 3 days to decr. discomfort.    Baseline Once every week with medication    Time 8    Period Weeks    Status New      PT LONG TERM GOAL #3   Title Pt will improve pelvic floor muscle coordination and alignment in order to report 0 episodes of urge incontinence over the last four weeks.    Baseline wears 1 pad/day and leaks at least once a month    Time 10    Period Weeks    Status New      PT LONG TERM GOAL #4   Title Pt will demonstrate improved PFM contraction without glute/adductor compensations with TrA engaged during STS txfs in order to care for clients without incr. in LBP and pelvic pain.    Time 10    Period Weeks    Status New      PT LONG TERM GOAL #5   Title Pt will improve FOTO score to >/=60 on urinary problems and >/=54 on bowel constipation sections to improve QOL.    Baseline 51: urinary promblem and 61: bowel constipation.    Time 10    Period Weeks    Status New                   Plan - 08/18/21 1257     Clinical Impression Statement Today's skilled session focused on relaxation and mindfulness techniques to  decr. pelvic floor muscle (PFM) tension, as pt reported she is stressed when having an GYN exam or the thought of a PT exam. PT reiterated that pt is always able to decline a internal muscle assessment. Pt noted to experience incr. muscular tension and fascial tension in B IT band, which decr. with manual therapy. Pt continues to be unable to fully extend B knees in supine or stance, which can contribute to incr. tension in LEs, back and pelvic floor via kinetic chain. Pt would continue to benefit from skilled PT to decr. pelvic pain, prolapse, incr. strength, ROM and improve posture during all ADLs.    Comorbidities HTN, hyperthyroidism, HLD, DM, Cx radiculitis, Lx radiculopathy, arthiritis, Potter rotator cuff rupture, OA, cystocele, eczema, cystocele repair and hysterectomy on 02/02/21    PT Treatment/Interventions ADLs/Self Care Home Management;Gait training;Stair training;Functional mobility training;Therapeutic activities;Neuromuscular re-education;Balance training;Therapeutic exercise;Patient/family education;Manual techniques;Joint Manipulations    PT Next Visit Plan Review HEP and progress prn. internal exam prn. Manual therapy hips, add prn.    PT Home Exercise Plan [email or web address]             Patient will benefit from skilled therapeutic intervention in order to improve the following deficits and impairments:  Abnormal gait, Decreased balance, Hypomobility, Pain, Postural dysfunction, Impaired flexibility, Decreased strength, Decreased range of motion, Decreased coordination  Visit Diagnosis: Mixed incontinence  Muscle weakness (generalized)  Other lack  of coordination  Other abnormalities of gait and mobility  Sacrococcygeal disorders, not elsewhere classified     Problem List Patient Active Problem List   Diagnosis Date Noted   Pelvic organ prolapse quantification stage 2 cystocele 02/02/2021   Statin medication declined by patient 07/28/2020   Lumbar radiculopathy 07/21/2020   T2DM  (type 2 diabetes mellitus) ([place]) 01/03/2019   Hyperlipidemia associated with type 2 diabetes mellitus ([place]) 06/20/2018   Primary osteoarthritis of both knees 02/16/2017   Impingement syndrome of shoulder 07/02/2015   Complete rotator cuff rupture of left shoulder 07/02/2015   Cervical radiculitis 07/02/2015   Impingement syndrome of both shoulders 07/02/2015   Arthritis sicca 04/09/2015   Hypertension associated with diabetes ([place]) 04/09/2015   H/O eczema 04/09/2015   Hyperthyroidism 04/09/2015   Arthritis 04/09/2015    Molly Potter, PT 08/18/2021, 1:04 PM  [place] MAIN [facility] SERVICES [place], [place], [address] Phone: [phone]   Fax:  [phone]  Name: Molly Potter MRN: [id number] Date of Birth: [birth year]  Geoffry Paradise, PT,DPT 08/18/21 1:05 PM Phone: [phone] Fax: [phone]

## 2021-08-25 ENCOUNTER — Ambulatory Visit: Payer: BC Managed Care – PPO

## 2021-08-25 ENCOUNTER — Other Ambulatory Visit: Payer: Self-pay

## 2021-08-25 DIAGNOSIS — M533 Sacrococcygeal disorders, not elsewhere classified: Secondary | ICD-10-CM

## 2021-08-25 DIAGNOSIS — M6281 Muscle weakness (generalized): Secondary | ICD-10-CM | POA: Diagnosis not present

## 2021-08-25 DIAGNOSIS — R278 Other lack of coordination: Secondary | ICD-10-CM

## 2021-08-25 DIAGNOSIS — R2689 Other abnormalities of gait and mobility: Secondary | ICD-10-CM

## 2021-08-25 DIAGNOSIS — N3946 Mixed incontinence: Secondary | ICD-10-CM

## 2021-08-25 NOTE — Therapy (Signed)
[place] MAIN [facility] SERVICES [address] [place], [place], [address] Phone: [phone]   Fax:  [phone]  Physical Therapy Treatment  Patient Details  Name: Molly Potter MRN: [id number] Date of Birth: 02/09/1956 No data recorded  Encounter Date: 08/25/2021   PT End of Session - 08/25/21 0851     Visit Number 6    Number of Visits 10    Date for PT Re-Evaluation 09/08/21    Authorization Type BCBS Medicare: 30 PT/OT combined    Authorization - Visit Number 6    Authorization - Number of Visits 30    Progress Note Due on Visit 10    PT Start Time 0802    PT Stop Time 0856    PT Time Calculation (min) 54 min    Activity Tolerance Patient tolerated treatment well    Behavior During Therapy WFL for tasks assessed/performed             Past Medical History:  Diagnosis Date   Arthritis    Diabetes mellitus without complication ([place])    diet controlled   Fatigue    GERD (gastroesophageal reflux disease)    History of eczema    Hypertension    Hyperthyroidism    Joint pain    PONV (postoperative nausea and vomiting)     Past Surgical History:  Procedure Laterality Date   ABDOMINAL HYSTERECTOMY  01/2021   CYSTOCELE REPAIR N/A 02/02/2021   Procedure: ANTERIOR REPAIR (CYSTOCELE), MODIFIED MCCALLS CULDOPLASTY;  Surgeon: Benjaman Kindler, MD;  Location: [facility] ORS;  Service: Gynecology;  Laterality: N/A;   CYSTOSCOPY N/A 02/02/2021   Procedure: CYSTOSCOPY;  Surgeon: Benjaman Kindler, MD;  Location: [facility] ORS;  Service: Gynecology;  Laterality: N/A;   LAPAROSCOPIC ASSISTED VAGINAL HYSTERECTOMY N/A 02/02/2021   Procedure: LAPAROSCOPIC ASSISTED VAGINAL HYSTERECTOMY;  Surgeon: Benjaman Kindler, MD;  Location: [facility] ORS;  Service: Gynecology;  Laterality: N/A;   LAPAROSCOPIC BILATERAL SALPINGECTOMY Bilateral 02/02/2021   Procedure: LAPAROSCOPIC BILATERAL SALPINGECTOMY AND OOPHERECTOMY;  Surgeon: Benjaman Kindler, MD;  Location: [facility]  ORS;  Service: Gynecology;  Laterality: Bilateral;   ROTATOR CUFF REPAIR Right    TUBAL LIGATION      There were no vitals filed for this visit.   Subjective Assessment - 08/25/21 0805     Subjective Pt reported her knees and feet have been causing a lot of trouble the last few years. MD suggested B TKA but pt is very hesitant. Pt stated her HEP has been going well. Pt reported no pain in pelvic floor but her bladder still hurts when it gets full.    Pertinent History HTN, hyperthyroidism, HLD, DM, Cx radiculitis, Lx radiculopathy, arthiritis, L rotator cuff rupture, OA, cystocele, eczema, cystocele repair and hysterectomy on 02/02/21    Patient Stated Goals "To be stronger (my core needs to be stronger).    Currently in Pain? No/denies              Access Code: [email or web address] URL: [email or web address] Date: 08/25/2021 Prepared by: Geoffry Paradise  Beginner Bridge - 1 x daily - 3 x weekly - 2 sets - 10 reps Supine Transversus Abdominis Bracing with Leg Extension - 1 x daily - 3 x weekly - 2 sets - 10 reps Pt also performed hip marches in supine with TrA engagement x10 reps/LE.  Cues and demo for technique. S for safety.                 [facility] Adult PT Treatment/Exercise - 08/25/21  0811       Manual Therapy   Manual Therapy Soft tissue mobilization;Joint mobilization    Manual therapy comments Pt reported LEs and feet "felt good" after manual therapy and she was able to improve stride length and heel strike during gait-4x10'.    Joint Mobilization PT performed talocrural AP jt. mob: grade 3, 2x30 sec. bouts to improve B ankle dorsiflexion.    Soft tissue mobilization PT performed STM and trigger point to B IT band and B quad musculature and B hip add. to decr. tension.                       PT Short Term Goals - 08/25/21 0901       PT SHORT TERM GOAL #1   Title Pt will be IND in HEP to improve strength, posture, and flexibility in order  to improve functional mobility and pain.    Baseline No HEP    Time 4    Period Weeks    Status Achieved    Target Date 07/28/21      PT SHORT TERM GOAL #2   Title Pt will be able to coordinate PFM to decr. urge incontinence and report nocturia 0-1/night to improve QOL.    Baseline 2-3x/week; 11/22: she feels sometimes better, sometimes worse as she is now mindful of it-approx. several a times week, nocturia 2-3 times per night    Time 4    Period Weeks    Status Not Met    Target Date 07/28/21      PT SHORT TERM GOAL #3   Title Pt will demonstrate improve posture, alignment and hip strength surrounding pelvis to decr. pelvic pain during intercourse and job activities.    Baseline Incr. lx spine lordosis, decr. post. pelvic tilt and 3+/5 hip abd. (B), 11/22: Lx spine lordosis still present but somewhat improved, pelvic tilt improved and B hip abd: 4-/5    Time 4    Period Weeks    Status Partially Met               PT Long Term Goals - 08/25/21 0901       PT LONG TERM GOAL #1   Title Pt will complete FOTO and write goal as indicated.    Baseline no FOTO    Time 8    Period Weeks    Status Achieved      PT LONG TERM GOAL #2   Title Pt will demonstrate proper toileting posture, PFM coordination, and verbalize strategies to incr. bowel frequency to every 3 days to decr. discomfort.    Baseline Once every week with medication    Time 8    Period Weeks    Status New      PT LONG TERM GOAL #3   Title Pt will improve pelvic floor muscle coordination and alignment in order to report 0 episodes of urge incontinence over the last four weeks.    Baseline wears 1 pad/day and leaks at least once a month    Time 10    Period Weeks    Status New      PT LONG TERM GOAL #4   Title Pt will demonstrate improved PFM contraction without glute/adductor compensations with TrA engaged during STS txfs in order to care for clients without incr. in LBP and pelvic pain.    Time 10     Period Weeks    Status New  PT LONG TERM GOAL #5   Title Pt will improve FOTO score to >/=60 on urinary problems and >/=54 on bowel constipation sections to improve QOL.    Baseline 51: urinary promblem and 61: bowel constipation.    Time 10    Period Weeks    Status New                   Plan - 08/25/21 7829     Clinical Impression Statement Today's skilled session focused on manual therapy to decr. BLE tension and trigger points and improve ankle ROM, all which can impact pelvic floor tension via kinetic chain. PT also incorporated core strengthening activities to improve strength and coordination of core. Pt's B dorsiflexion was limited but improved after joint mobilizations. Pt continues to require cues to relax after each rep of activity. Pt would continue to be benefit from skilled PT to improve pain, flexibility, gait, balance, and strength.    PT Treatment/Interventions ADLs/Self Care Home Management;Gait training;Stair training;Functional mobility training;Therapeutic activities;Neuromuscular re-education;Balance training;Therapeutic exercise;Patient/family education;Manual techniques;Joint Manipulations    PT Next Visit Plan Review HEP and progress prn (foot and ankle). internal exam prn. Manual therapy hips, add prn.    Consulted and Agree with Plan of Care Patient             Patient will benefit from skilled therapeutic intervention in order to improve the following deficits and impairments:  Abnormal gait, Decreased balance, Hypomobility, Pain, Postural dysfunction, Impaired flexibility, Decreased strength, Decreased range of motion, Decreased coordination  Visit Diagnosis: Muscle weakness (generalized)  Mixed incontinence  Other lack of coordination  Other abnormalities of gait and mobility  Sacrococcygeal disorders, not elsewhere classified     Problem List Patient Active Problem List   Diagnosis Date Noted   Pelvic organ prolapse  quantification stage 2 cystocele 02/02/2021   Statin medication declined by patient 07/28/2020   Lumbar radiculopathy 07/21/2020   T2DM (type 2 diabetes mellitus) ([place]) 01/03/2019   Hyperlipidemia associated with type 2 diabetes mellitus ([place]) 06/20/2018   Primary osteoarthritis of both knees 02/16/2017   Impingement syndrome of shoulder 07/02/2015   Complete rotator cuff rupture of left shoulder 07/02/2015   Cervical radiculitis 07/02/2015   Impingement syndrome of both shoulders 07/02/2015   Arthritis sicca 04/09/2015   Hypertension associated with diabetes ([place]) 04/09/2015   H/O eczema 04/09/2015   Hyperthyroidism 04/09/2015   Arthritis 04/09/2015    Machi Whittaker L, PT 08/25/2021, 9:02 AM  [place] MAIN [facility] SERVICES [address] [place], [place], [address] Phone: [phone]   Fax:  [phone]  Name: Molly Potter MRN: [id number] Date of Birth: 09-17-1955   Geoffry Paradise, PT,DPT 08/25/21 9:02 AM Phone: [phone] Fax: [phone]

## 2021-08-25 NOTE — Patient Instructions (Signed)
Access Code: [email or web address] URL: [email or web address] Date: 08/25/2021 Prepared by: Geoffry Paradise  Beginner Bridge - 1 x daily - 3 x weekly - 2 sets - 10 reps Supine Transversus Abdominis Bracing with Leg Extension - 1 x daily - 3 x weekly - 2 sets - 10 reps

## 2021-09-01 ENCOUNTER — Ambulatory Visit: Payer: BC Managed Care – PPO

## 2021-09-01 ENCOUNTER — Other Ambulatory Visit: Payer: Self-pay

## 2021-09-01 DIAGNOSIS — M533 Sacrococcygeal disorders, not elsewhere classified: Secondary | ICD-10-CM

## 2021-09-01 DIAGNOSIS — R2689 Other abnormalities of gait and mobility: Secondary | ICD-10-CM

## 2021-09-01 DIAGNOSIS — M6281 Muscle weakness (generalized): Secondary | ICD-10-CM | POA: Diagnosis not present

## 2021-09-01 DIAGNOSIS — R278 Other lack of coordination: Secondary | ICD-10-CM

## 2021-09-01 DIAGNOSIS — N3946 Mixed incontinence: Secondary | ICD-10-CM

## 2021-09-01 NOTE — Therapy (Signed)
[place] MAIN [facility] SERVICES [address] [place], [place], [address] Phone: [phone]   Fax:  [phone]  Physical Therapy Treatment  Patient Details  Name: Molly Potter MRN: [id number] Date of Birth: [birth year] No data recorded  Encounter Date: 09/01/2021   PT End of Session - 09/01/21 0953     Visit Number 7    Number of Visits 10    Date for PT Re-Evaluation 09/08/21    Authorization Type BCBS Medicare: 30 PT/OT combined    Authorization - Visit Number 7    Authorization - Number of Visits 30    Progress Note Due on Visit 10    PT Start Time 0804    PT Stop Time 0858    PT Time Calculation (min) 54 min    Activity Tolerance Patient tolerated treatment well    Behavior During Therapy WFL for tasks assessed/performed             Past Medical History:  Diagnosis Date   Arthritis    Diabetes mellitus without complication ([place])    diet controlled   Fatigue    GERD (gastroesophageal reflux disease)    History of eczema    Hypertension    Hyperthyroidism    Joint pain    PONV (postoperative nausea and vomiting)     Past Surgical History:  Procedure Laterality Date   ABDOMINAL HYSTERECTOMY  01/2021   CYSTOCELE REPAIR N/A 02/02/2021   Procedure: ANTERIOR REPAIR (CYSTOCELE), MODIFIED MCCALLS CULDOPLASTY;  Surgeon: Benjaman Kindler, MD;  Location: [facility] ORS;  Service: Gynecology;  Laterality: N/A;   CYSTOSCOPY N/A 02/02/2021   Procedure: CYSTOSCOPY;  Surgeon: Benjaman Kindler, MD;  Location: [facility] ORS;  Service: Gynecology;  Laterality: N/A;   LAPAROSCOPIC ASSISTED VAGINAL HYSTERECTOMY N/A 02/02/2021   Procedure: LAPAROSCOPIC ASSISTED VAGINAL HYSTERECTOMY;  Surgeon: Benjaman Kindler, MD;  Location: [facility] ORS;  Service: Gynecology;  Laterality: N/A;   LAPAROSCOPIC BILATERAL SALPINGECTOMY Bilateral 02/02/2021   Procedure: LAPAROSCOPIC BILATERAL SALPINGECTOMY AND OOPHERECTOMY;  Surgeon: Benjaman Kindler, MD;  Location: [facility]  ORS;  Service: Gynecology;  Laterality: Bilateral;   ROTATOR CUFF REPAIR Right    TUBAL LIGATION      There were no vitals filed for this visit.   Subjective Assessment - 09/01/21 0807     Subjective Pt reported the marches have been challenging on the bed, so she's going to try to on the floor but it's challenging to get up/down from the floor. She went to the walking track (one mile) twice last week at the [facility] track, the track surface decr. knee pain. Pt stated RLE has been painful getting in/out of truck and she able to massage it out last night. Pt stated she can tell the RLE is getting stronger as she can go step over step vs. step to on stairs.    Pertinent History HTN, hyperthyroidism, HLD, DM, Cx radiculitis, Lx radiculopathy, arthiritis, L rotator cuff rupture, OA, cystocele, eczema, cystocele repair and hysterectomy on 02/02/21    Patient Stated Goals "To be stronger (my core needs to be stronger).    Currently in Pain? No/denies                     NMR: Pt performed SLS activities, stepping in ant/lat/post directions x5 reps/LE with breathing coordination and pelvic floor muscle (PFM) contraction to improve balance and PFM coordination. PT demonstrated and provided cues for technique. S for safety and intermittent UE support on counter to maintain balance.  txfs: NMR  [facility] Adult PT Treatment/Exercise - 09/01/21 0819       Transfers   Transfers Floor to Transfer    Floor to Transfer 5: Supervision    Floor to Transfer Details (indicate cue type and reason) PT provided demo for technique and to use UE support to decr. knee pain. S for safety while pt performed floor<>stand txf with UE support at mat table.      Manual Therapy   Manual Therapy Soft tissue mobilization;Joint mobilization    Manual therapy comments Pt reported LEs and feet "felt good" after manual therapy and she was able to improve stride length and heel strike during gait-4x10'.     Joint Mobilization PT performed talocrural AP jt. mob: grade 3, 2x30 sec. bouts to improve B ankle dorsiflexion.    Soft tissue mobilization PT performed STM and trigger point to B IT band and B quad musculature and B hip add. to decr. tension. RLE more tension noted vs. LLE.                       PT Short Term Goals - 08/25/21 0901       PT SHORT TERM GOAL #1   Title Pt will be IND in HEP to improve strength, posture, and flexibility in order to improve functional mobility and pain.    Baseline No HEP    Time 4    Period Weeks    Status Achieved    Target Date 07/28/21      PT SHORT TERM GOAL #2   Title Pt will be able to coordinate PFM to decr. urge incontinence and report nocturia 0-1/night to improve QOL.    Baseline 2-3x/week; 11/22: she feels sometimes better, sometimes worse as she is now mindful of it-approx. several a times week, nocturia 2-3 times per night    Time 4    Period Weeks    Status Not Met    Target Date 07/28/21      PT SHORT TERM GOAL #3   Title Pt will demonstrate improve posture, alignment and hip strength surrounding pelvis to decr. pelvic pain during intercourse and job activities.    Baseline Incr. lx spine lordosis, decr. post. pelvic tilt and 3+/5 hip abd. (B), 11/22: Lx spine lordosis still present but somewhat improved, pelvic tilt improved and B hip abd: 4-/5    Time 4    Period Weeks    Status Partially Met               PT Long Term Goals - 09/01/21 6734       PT LONG TERM GOAL #1   Title Pt will complete FOTO and write goal as indicated.    Baseline no FOTO    Time 8    Period Weeks    Status Achieved      PT LONG TERM GOAL #2   Title Pt will demonstrate proper toileting posture, PFM coordination, and verbalize strategies to incr. bowel frequency to every 3 days to decr. discomfort.    Baseline Once every week with medication    Time 8    Period Weeks    Status New      PT LONG TERM GOAL #3   Title Pt will  improve pelvic floor muscle coordination and alignment in order to report 0 episodes of urge incontinence over the last four weeks.    Baseline wears 1 pad/day and leaks at least once a month  Time 10    Period Weeks    Status New      PT LONG TERM GOAL #4   Title Pt will demonstrate improved PFM contraction without glute/adductor compensations with TrA engaged during STS txfs in order to care for clients without incr. in LBP and pelvic pain.    Time 10    Period Weeks    Status New      PT LONG TERM GOAL #5   Title Pt will improve FOTO score to >/=60 on urinary problems and >/=54 on bowel constipation sections to improve QOL.    Baseline 51: urinary promblem and 61: bowel constipation.    Time 10    Period Weeks    Status New                   Plan - 09/01/21 0954     Clinical Impression Statement Pt continuing to demonstrate progress as she reported less pain overall pelvic pain and ability to perform step over step vs. step to pattern on stairs. Pt also able to tolerate progression of pelvic floor contraction during SLS activities to improve balance during functional activities at work and during ADLs. Pt continues to experience pelvic pain, muscle weakness, impaired balance, impaired flexibility and gait deviations and would continue to benefit from skilled PT improve all deficits listed above which can impact the pelvic floor. LTGs moved to next session 2/2 missed appt's early in POC.    Comorbidities HTN, hyperthyroidism, HLD, DM, Cx radiculitis, Lx radiculopathy, arthiritis, L rotator cuff rupture, OA, cystocele, eczema, cystocele repair and hysterectomy on 02/02/21    PT Treatment/Interventions ADLs/Self Care Home Management;Gait training;Stair training;Functional mobility training;Therapeutic activities;Neuromuscular re-education;Balance training;Therapeutic exercise;Patient/family education;Manual techniques;Joint Manipulations    PT Next Visit Plan Goals and recert.  Review HEP and progress prn (foot and ankle). internal exam prn. Manual therapy hips, add prn.    PT Home Exercise Plan [email or web address]    Consulted and Agree with Plan of Care Patient             Patient will benefit from skilled therapeutic intervention in order to improve the following deficits and impairments:  Abnormal gait, Decreased balance, Hypomobility, Pain, Postural dysfunction, Impaired flexibility, Decreased strength, Decreased range of motion, Decreased coordination  Visit Diagnosis: Muscle weakness (generalized)  Mixed incontinence  Sacrococcygeal disorders, not elsewhere classified  Other lack of coordination  Other abnormalities of gait and mobility     Problem List Patient Active Problem List   Diagnosis Date Noted   Pelvic organ prolapse quantification stage 2 cystocele 02/02/2021   Statin medication declined by patient 07/28/2020   Lumbar radiculopathy 07/21/2020   T2DM (type 2 diabetes mellitus) ([place]) 01/03/2019   Hyperlipidemia associated with type 2 diabetes mellitus ([place]) 06/20/2018   Primary osteoarthritis of both knees 02/16/2017   Impingement syndrome of shoulder 07/02/2015   Complete rotator cuff rupture of left shoulder 07/02/2015   Cervical radiculitis 07/02/2015   Impingement syndrome of both shoulders 07/02/2015   Arthritis sicca 04/09/2015   Hypertension associated with diabetes ([place]) 04/09/2015   H/O eczema 04/09/2015   Hyperthyroidism 04/09/2015   Arthritis 04/09/2015    Elizabeht Suto L, PT 09/01/2021, 9:58 AM  [place] [address] [place], [place], [address] Phone: [phone]   Fax:  [phone]  Name: Molly Potter MRN: [id number] Date of Birth: 18-Aug-1956   Geoffry Paradise, PT,DPT 09/01/21 10:00 AM Phone: [phone] Fax: [phone]

## 2021-09-09 ENCOUNTER — Inpatient Hospital Stay
Admission: RE | Admit: 2021-09-09 | Discharge: 2021-09-09 | Disposition: A | Discharge status: Home / Self Care | Payer: Self-pay | Source: Ambulatory Visit | Attending: *Deleted | Admitting: *Deleted

## 2021-09-09 ENCOUNTER — Other Ambulatory Visit: Payer: Self-pay | Admitting: *Deleted

## 2021-09-09 DIAGNOSIS — Z1231 Encounter for screening mammogram for malignant neoplasm of breast: Secondary | ICD-10-CM

## 2021-09-18 ENCOUNTER — Other Ambulatory Visit: Payer: Self-pay | Admitting: Family Medicine

## 2021-09-18 DIAGNOSIS — I10 Essential (primary) hypertension: Secondary | ICD-10-CM

## 2021-09-18 NOTE — Telephone Encounter (Signed)
Requested Prescriptions  Pending Prescriptions Disp Refills   diltiazem (CARDIZEM CD) 240 MG 24 hr capsule [Pharmacy Med Name: dilTIAZem HCl ER Coated Beads 240 MG Oral Capsule Extended Release 24 Hour] 90 capsule 0    Sig: TAKE 1 CAPSULE BY MOUTH IN THE EVENING     Cardiovascular:  Calcium Channel Blockers Passed - 09/18/2021  7:14 PM      Passed - Last BP in normal range    BP Readings from Last 1 Encounters:  07/20/21 138/76         Passed - Valid encounter within last 6 months    Recent Outpatient Visits          2 months ago Welcome to [employer] preventive visit   [facility] [place], Dionne Bucy, [place]   8 months ago Hypertension associated with diabetes [facility])   [facility], Dionne Bucy, MD   1 year ago Chest pain, unspecified type   [facility], Clearnce Sorrel, [place]   1 year ago Annual physical exam   [place], [place]   1 year ago Upper respiratory tract infection, unspecified type   [facility] Birdie Sons, MD      Future Appointments            In 1 week Gwyneth Sprout, [place], [place]

## 2021-09-21 ENCOUNTER — Other Ambulatory Visit: Payer: Self-pay

## 2021-09-21 ENCOUNTER — Ambulatory Visit: Payer: BC Managed Care – PPO | Attending: Obstetrics and Gynecology

## 2021-09-21 DIAGNOSIS — N3946 Mixed incontinence: Secondary | ICD-10-CM

## 2021-09-21 DIAGNOSIS — R2689 Other abnormalities of gait and mobility: Secondary | ICD-10-CM

## 2021-09-21 DIAGNOSIS — R278 Other lack of coordination: Secondary | ICD-10-CM | POA: Diagnosis not present

## 2021-09-21 DIAGNOSIS — M533 Sacrococcygeal disorders, not elsewhere classified: Secondary | ICD-10-CM

## 2021-09-21 DIAGNOSIS — M6281 Muscle weakness (generalized): Secondary | ICD-10-CM

## 2021-09-21 MED ORDER — LOSARTAN POTASSIUM 50 MG PO TABS
100.0000 mg | ORAL_TABLET | Freq: Every day | ORAL | 3 refills | Status: DC
Start: 2021-09-21 — End: 2021-12-24

## 2021-09-21 NOTE — Telephone Encounter (Signed)
I was going to refill but Im getting a red hard stop saying this medication is no longer under patient formulary plan,can you please review ?KW

## 2021-09-21 NOTE — Therapy (Signed)
[place] MAIN [facility] SERVICES [address] [place], [place], [address] Phone: [phone]   Fax:  [phone]  Physical Therapy Treatment  Patient Details  Name: Molly Potter MRN: [id number] Date of Birth: May 17, 1956 No data recorded  Encounter Date: 09/21/2021   PT End of Session - 09/21/21 1312     Visit Number 8    Number of Visits 10    Date for PT Re-Evaluation 09/08/21   new POC: 10/19/21, 1x/week for 4 weeks, for 12 visits total   Authorization Type BCBS Medicare: 30 PT/OT combined    Authorization Time Period 7/30 in 2022, not sure if visits start over in Jan?    Authorization - Visit Number 8    Authorization - Number of Visits 30    Progress Note Due on Visit 10    PT Start Time 0904    PT Stop Time 0958    PT Time Calculation (min) 54 min    Activity Tolerance Patient tolerated treatment well    Behavior During Therapy [facility] for tasks assessed/performed             Past Medical History:  Diagnosis Date   Arthritis    Diabetes mellitus without complication ([place])    diet controlled   Fatigue    GERD (gastroesophageal reflux disease)    History of eczema    Hypertension    Hyperthyroidism    Joint pain    PONV (postoperative nausea and vomiting)     Past Surgical History:  Procedure Laterality Date   ABDOMINAL HYSTERECTOMY  01/2021   CYSTOCELE REPAIR N/A 02/02/2021   Procedure: ANTERIOR REPAIR (CYSTOCELE), MODIFIED MCCALLS CULDOPLASTY;  Surgeon: Benjaman Kindler, MD;  Location: [facility] ORS;  Service: Gynecology;  Laterality: N/A;   CYSTOSCOPY N/A 02/02/2021   Procedure: CYSTOSCOPY;  Surgeon: Benjaman Kindler, MD;  Location: [facility] ORS;  Service: Gynecology;  Laterality: N/A;   LAPAROSCOPIC ASSISTED VAGINAL HYSTERECTOMY N/A 02/02/2021   Procedure: LAPAROSCOPIC ASSISTED VAGINAL HYSTERECTOMY;  Surgeon: Benjaman Kindler, MD;  Location: [facility] ORS;  Service: Gynecology;  Laterality: N/A;   LAPAROSCOPIC BILATERAL SALPINGECTOMY  Bilateral 02/02/2021   Procedure: LAPAROSCOPIC BILATERAL SALPINGECTOMY AND OOPHERECTOMY;  Surgeon: Benjaman Kindler, MD;  Location: [facility] ORS;  Service: Gynecology;  Laterality: Bilateral;   ROTATOR CUFF REPAIR Right    TUBAL LIGATION      There were no vitals filed for this visit.   Subjective Assessment - 09/21/21 0908     Subjective Pt reported she's been walking almost daily (1-1.5 miles), it takes about 10 minutes. She feels good but her back pain and sciatica has been incr. a bit with walking. She's been massaging LEs to decr. knots. Pt reported pelvic pain has been good, she doesn't have it often. She has to go about 18-19 steps to reach the walking track at the [facility].    Pertinent History HTN, hyperthyroidism, HLD, DM, Cx radiculitis, Lx radiculopathy, arthiritis, L rotator cuff rupture, OA, cystocele, eczema, cystocele repair and hysterectomy on 02/02/21    Patient Stated Goals "To be stronger (my core needs to be stronger).    Currently in Pain? No/denies                NMR: Access Code: [email or web address] URL: [email or web address] Date: 09/21/2021 Prepared by: Geoffry Paradise  Exercises Clamshell - 1 x daily - 3 x weekly - 3 sets - 10 reps - 1-2 hold just reviewed. Supine Diaphragmatic Breathing - 3 x daily - 7 x weekly - 1  sets - 5 reps - 2 hold Supine Hip Adductor Stretch - 2-3 x daily - 7 x weekly - 1 sets - 3 reps - 30-60 hold Supine Piriformis Stretch with Leg Straight - 1 x daily - 7 x weekly - 1 sets - 3 reps - 30-60 hold Supine Pelvic Floor Contraction - 1 x daily - 7 x weekly - 1 sets - 10 reps 10 quick reps and x5 reps of 5 sec. Holds. Supine Pelvic Tilt - 1 x daily - 7 x weekly - 1 sets - 10 reps. And reps in seated and standing.  Happy Baby with Pelvic Floor Lengthening - 1 x daily - 7 x weekly - 1 sets - 3 reps - 30-45 hold. Demonstrated how to perform against the wall. Supine Lower Trunk Rotation - 1 x daily - 7 x weekly - 1 sets - 3 reps - 30  hold Beginner Bridge - 1 x daily - 3 x weekly - 2 sets - 10 reps, with deep core. Supine Transversus Abdominis Bracing with Leg Extension - 1 x daily - 3 x weekly - 2 sets - 10 reps  Pt performed with S for safety. Cues and demo for proper technique and modifications.                         PT Education - 09/21/21 1311     Education Details PT discussed re-cert to complete POC (1x/week for four weeks). PT reviewed HEP and reiterated the importance of performing HEP and lifting/txfs with deep core engaged to decr. LBP.    Person(s) Educated Patient    Methods Explanation;Demonstration;Verbal cues;Handout    Comprehension Verbalized understanding;Returned demonstration;Need further instruction              PT Short Term Goals - 08/25/21 0901       PT SHORT TERM GOAL #1   Title Pt will be IND in HEP to improve strength, posture, and flexibility in order to improve functional mobility and pain.    Baseline No HEP    Time 4    Period Weeks    Status Achieved    Target Date 07/28/21      PT SHORT TERM GOAL #2   Title Pt will be able to coordinate PFM to decr. urge incontinence and report nocturia 0-1/night to improve QOL.    Baseline 2-3x/week; 11/22: she feels sometimes better, sometimes worse as she is now mindful of it-approx. several a times week, nocturia 2-3 times per night    Time 4    Period Weeks    Status Not Met    Target Date 07/28/21      PT SHORT TERM GOAL #3   Title Pt will demonstrate improve posture, alignment and hip strength surrounding pelvis to decr. pelvic pain during intercourse and job activities.    Baseline Incr. lx spine lordosis, decr. post. pelvic tilt and 3+/5 hip abd. (B), 11/22: Lx spine lordosis still present but somewhat improved, pelvic tilt improved and B hip abd: 4-/5    Time 4    Period Weeks    Status Partially Met               PT Long Term Goals - 09/21/21 0946       PT LONG TERM GOAL #1   Title Pt will  complete FOTO and write goal as indicated.    Baseline no FOTO    Time 8    Period  Weeks    Status Achieved      PT LONG TERM GOAL #2   Title Pt will demonstrate proper toileting posture, PFM coordination, and verbalize strategies to incr. bowel frequency to every 3 days to decr. discomfort.    Baseline Once every week with medication, 09/21/21: every other day to every day.    Time 8    Period Weeks    Status Achieved      PT LONG TERM GOAL #3   Title Pt will improve pelvic floor muscle coordination and alignment in order to report 0 episodes of urge incontinence over the last four weeks.    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 in LBP and pelvic pain.    Baseline 09/21/21: pt reported not much pelvic pain but LBP is still present. Pt feels 50-60% better.    Time 10    Period Weeks    Status Partially Met      PT LONG TERM GOAL #5   Title Pt will improve FOTO score to >/=60 on urinary problems and >/=54 on bowel constipation sections to improve QOL.    Baseline 51: urinary promblem and 61: bowel constipation.    Time 10    Period Weeks    Status Deferred      Additional Long Term Goals   Additional Long Term Goals Yes      PT LONG TERM GOAL #6   Title Pt will amb. 1.5 miles with improved posture without reports of incr. back or pelvic pain, in order to exercise.    Time 4    Period Weeks    Status New    Target Date 11/16/21                   Plan - 09/21/21 1314     Clinical Impression Statement Pt demonstrated progress as she met LTGs 2 and 3. Pt partially met LTG 4 and LTG 5 (FOTO) deferred until end of POC. Pt's pelvic pain and leakage have improved. However, she  continues to experience intermittent pelvic pain and LBP. LBP did decr. during session with transverse abdominus engagement during HEP. Pt continues to experience difficulty performing pelvic tilts in standing and impaired posture and B knee flexed in stance (B TKA was suggested by MD but pt declined). Pt would continue to benefit from skilled PT to improve deficits listed above. All unmet goals will be carried over to new POC.  PT requesting additional 1x/week for 4 weeks.    Personal Factors and Comorbidities Comorbidity 3+;Age;Profession    Comorbidities HTN, hyperthyroidism, HLD, DM, Cx radiculitis, Lx radiculopathy, arthiritis, L rotator cuff rupture, OA, cystocele, eczema, cystocele repair and hysterectomy on 02/02/21    Examination-Activity Limitations [place];Caring for Others;Carry;Continence;Locomotion Level;Lift    Examination-Participation Restrictions Cleaning;Laundry;Occupation;Meal Prep    PT Frequency 1x / week    PT Duration 4 weeks   original POC: 1x/week for 10 weeks   PT Treatment/Interventions ADLs/Self Care Home Management;Gait training;Stair training;Functional mobility training;Therapeutic activities;Neuromuscular re-education;Balance training;Therapeutic exercise;Patient/family education;Manual techniques;Joint Manipulations;Moist Heat;Cryotherapy    PT Next Visit Plan Review HEP and progress prn (foot and ankle). internal  exam prn. Manual therapy hips, add prn.    PT Home Exercise Plan [email or web address]    Consulted and Agree with Plan of Care Patient             Patient will benefit from skilled therapeutic intervention in order to improve the following deficits and impairments:  Abnormal gait, Decreased balance, Hypomobility, Pain, Postural dysfunction, Impaired flexibility, Decreased strength, Decreased range of motion, Decreased coordination  Visit Diagnosis: Mixed incontinence - Plan: PT plan of care cert/re-cert  Sacrococcygeal disorders, not elsewhere classified - Plan: PT  plan of care cert/re-cert  Other abnormalities of gait and mobility - Plan: PT plan of care cert/re-cert  Muscle weakness (generalized) - Plan: PT plan of care cert/re-cert  Other lack of coordination - Plan: PT plan of care cert/re-cert     Problem List Patient Active Problem List   Diagnosis Date Noted   Pelvic organ prolapse quantification stage 2 cystocele 02/02/2021   Statin medication declined by patient 07/28/2020   Lumbar radiculopathy 07/21/2020   T2DM (type 2 diabetes mellitus) ([place]) 01/03/2019   Hyperlipidemia associated with type 2 diabetes mellitus ([place]) 06/20/2018   Primary osteoarthritis of both knees 02/16/2017   Impingement syndrome of shoulder 07/02/2015   Complete rotator cuff rupture of left shoulder 07/02/2015   Cervical radiculitis 07/02/2015   Impingement syndrome of both shoulders 07/02/2015   Arthritis sicca 04/09/2015   Hypertension associated with diabetes ([place]) 04/09/2015   H/O eczema 04/09/2015   Hyperthyroidism 04/09/2015   Arthritis 04/09/2015    Miller,Jennifer L, PT 09/21/2021, 1:21 PM  [place] [address] [place], [place], [address] Phone: [phone]   Fax:  [phone]  Name: Molly Potter MRN: [id number] Date of Birth: 02/22/1956  Geoffry Paradise, PT,DPT 09/21/21 1:23 PM Phone: [phone] Fax: [phone]

## 2021-09-21 NOTE — Telephone Encounter (Signed)
[facility] faxed refill request for the following medications:  losartan (COZAAR) 50 MG tablet    Please advise.

## 2021-09-21 NOTE — Patient Instructions (Signed)
Access Code: [email or web address] URL: [email or web address] Date: 09/21/2021 Prepared by: Geoffry Paradise  Exercises Clamshell - 1 x daily - 3 x weekly - 3 sets - 10 reps - 1-2 hold Supine Diaphragmatic Breathing - 3 x daily - 7 x weekly - 1 sets - 5 reps - 2 hold Supine Hip Adductor Stretch - 2-3 x daily - 7 x weekly - 1 sets - 3 reps - 30-60 hold Supine Piriformis Stretch with Leg Straight - 1 x daily - 7 x weekly - 1 sets - 3 reps - 30-60 hold Supine Pelvic Floor Contraction - 1 x daily - 7 x weekly - 1 sets - 10 reps Supine Pelvic Tilt - 1 x daily - 7 x weekly - 1 sets - 10 reps, seated and standing.  Happy Baby with Pelvic Floor Lengthening - 1 x daily - 7 x weekly - 1 sets - 3 reps - 30-45 hold Supine Lower Trunk Rotation - 1 x daily - 7 x weekly - 1 sets - 3 reps - 30 hold Beginner Bridge - 1 x daily - 3 x weekly - 2 sets - 10 reps Supine Transversus Abdominis Bracing with Leg Extension - 1 x daily - 3 x weekly - 2 sets - 10 reps

## 2021-09-25 ENCOUNTER — Ambulatory Visit (INDEPENDENT_AMBULATORY_CARE_PROVIDER_SITE_OTHER): Payer: PPO | Admitting: Family Medicine

## 2021-09-25 ENCOUNTER — Encounter: Payer: Self-pay | Admitting: Family Medicine

## 2021-09-25 ENCOUNTER — Other Ambulatory Visit: Payer: Self-pay

## 2021-09-25 VITALS — BP 127/60 | HR 84 | Resp 18 | Wt 189.7 lb

## 2021-09-25 DIAGNOSIS — M5412 Radiculopathy, cervical region: Secondary | ICD-10-CM

## 2021-09-25 DIAGNOSIS — I10 Essential (primary) hypertension: Secondary | ICD-10-CM | POA: Insufficient documentation

## 2021-09-25 DIAGNOSIS — E1169 Type 2 diabetes mellitus with other specified complication: Secondary | ICD-10-CM | POA: Diagnosis not present

## 2021-09-25 DIAGNOSIS — E1159 Type 2 diabetes mellitus with other circulatory complications: Secondary | ICD-10-CM

## 2021-09-25 DIAGNOSIS — F5104 Psychophysiologic insomnia: Secondary | ICD-10-CM

## 2021-09-25 DIAGNOSIS — I152 Hypertension secondary to endocrine disorders: Secondary | ICD-10-CM | POA: Diagnosis not present

## 2021-09-25 DIAGNOSIS — J301 Allergic rhinitis due to pollen: Secondary | ICD-10-CM

## 2021-09-25 DIAGNOSIS — R0683 Snoring: Secondary | ICD-10-CM | POA: Diagnosis not present

## 2021-09-25 LAB — POCT GLYCOSYLATED HEMOGLOBIN (HGB A1C): Hemoglobin A1C: 7.9 % — AB (ref 4.0–5.6)

## 2021-09-25 MED ORDER — MONTELUKAST SODIUM 10 MG PO TABS: 10.0000 mg | ORAL_TABLET | Freq: Every day | ORAL | 3 refills | Status: AC

## 2021-09-25 MED ORDER — CYCLOBENZAPRINE HCL 5 MG PO TABS: 5.0000 mg | ORAL_TABLET | Freq: Every day | ORAL | 3 refills | Status: DC

## 2021-09-25 MED ORDER — TEMAZEPAM 7.5 MG PO CAPS: 7.5000 mg | ORAL_CAPSULE | Freq: Every evening | ORAL | 0 refills | Status: DC | PRN

## 2021-09-25 MED ORDER — DILTIAZEM HCL ER COATED BEADS 240 MG PO CP24: 240.0000 mg | ORAL_CAPSULE | Freq: Every day | ORAL | 3 refills | Status: AC

## 2021-09-25 MED ORDER — TRAZODONE HCL 50 MG PO TABS: 25.0000 mg | ORAL_TABLET | Freq: Every evening | ORAL | 3 refills | Status: DC | PRN

## 2021-09-25 NOTE — Assessment & Plan Note (Signed)
<  3 months since last A1c; slight improvement Notes can make more improvements with diet Helps other prep food, and doesn't want to do the same for herself -has joined silver sneakers Was walking 1.5 miles per day when the weather is nice RTC in 3 months Refused additional referral to lifestyle center for additional educuation

## 2021-09-25 NOTE — Progress Notes (Signed)
Established patient visit   Patient: Molly Potter   DOB: 1955-09-28   66 y.o. Female  MRN: [id number] Visit Date: 09/25/2021  Today's healthcare provider: Gwyneth Sprout, FNP   Chief Complaint  Patient presents with   Hypertension   Hyperlipidemia   Diabetes   Subjective    HPI  Diabetes Mellitus Type II, follow-up  Lab Results  Component Value Date   HGBA1C 7.9 (A) 09/25/2021   HGBA1C 8.4 (H) 07/20/2021   HGBA1C 7.0 (A) 01/19/2021   Last seen for diabetes 8 months ago.  Management since then includes continuing the same treatment.  Current insulin regiment: none Most Recent Eye Exam: >1 year  --------------------------------------------------------------------------------------------------- Hypertension, follow-up  BP Readings from Last 3 Encounters:  09/25/21 127/60  07/20/21 138/76  02/03/21 136/66   Wt Readings from Last 3 Encounters:  09/25/21 189 lb 11.2 oz (86 kg)  07/20/21 190 lb 14.4 oz (86.6 kg)  02/02/21 188 lb 0.8 oz (85.3 kg)     She was last seen for hypertension 8 months ago.  BP at that visit was 112/58. Management since that visit includes none. She reports excellent compliance with treatment. She is not having side effects.  She is exercising. She is adherent to low salt diet.   Outside blood pressures are systolic 975-300 and diastolic 51-10Y.  She does not smoke.  Use of agents associated with hypertension: none.   --------------------------------------------------------------------------------------------------- Lipid/Cholesterol, follow-up  Last Lipid Panel: Lab Results  Component Value Date   CHOL 142 07/20/2021   LDLCALC 72 07/20/2021   HDL 46 07/20/2021   TRIG 137 07/20/2021    She was last seen for this 8 months ago.  Management since that visit includes none, patient declined statin. On 01/26/21 patient was started on Crestor 16m  She reports poor compliance with treatment. She is not having side effects.    Symptoms: No appetite changes No foot ulcerations  No chest pain No chest pressure/discomfort  No dyspnea No orthopnea  No fatigue No lower extremity edema  No palpitations No paroxysmal nocturnal dyspnea  No nausea No numbness or tingling of extremity  Yes polydipsia Yes polyuria  No speech difficulty No syncope   She is following a Regular diet. Current exercise: walking  Last metabolic panel Lab Results  Component Value Date   GLUCOSE 173 (H) 07/20/2021   NA 141 07/20/2021   K 4.5 07/20/2021   BUN 10 07/20/2021   CREATININE 0.81 07/20/2021   EGFR 81 07/20/2021   GFRNONAA >60 02/03/2021   CALCIUM 9.6 07/20/2021   AST 13 07/20/2021   ALT 12 07/20/2021   The 10-year ASCVD risk score (Arnett DK, et al., 2019) is: 15.7%  ---------------------------------------------------------------------------------------------------   Medications: Outpatient Medications Prior to Visit  Medication Sig   acetaminophen (TYLENOL) 500 MG tablet Take 1,000 mg by mouth every 6 (six) hours as needed for moderate pain.   cholecalciferol (VITAMIN D3) 25 MCG (1000 UNIT) tablet Take 1,000 Units by mouth daily.   diclofenac Sodium (VOLTAREN) 1 % GEL Apply 2 g topically daily as needed (pain).   docusate sodium (COLACE) 100 MG capsule Take 1 capsule (100 mg total) by mouth 2 (two) times daily. To keep stools soft   glucose blood (CONTOUR NEXT TEST) test strip To check BG BID   loratadine (CLARITIN) 10 MG tablet Take 10 mg by mouth daily as needed for allergies.   losartan (COZAAR) 50 MG tablet Take 2 tablets (100 mg total) by mouth  daily.   Microlet Lancets MISC To check BG BID   Multiple Vitamin (MULTIVITAMIN) tablet Take 1 tablet by mouth daily.   Polyethyl Glycol-Propyl Glycol (SYSTANE OP) Place 1 drop into both eyes daily as needed (dry/irritated eyes).   rosuvastatin (CRESTOR) 5 MG tablet Take 1 tablet (5 mg total) by mouth daily.   [DISCONTINUED] cyclobenzaprine (FLEXERIL) 5 MG tablet TAKE 1  TABLET(5 MG) BY MOUTH AT BEDTIME (Patient taking differently: Take 5 mg by mouth daily as needed for muscle spasms.)   [DISCONTINUED] diltiazem (CARDIZEM CD) 240 MG 24 hr capsule TAKE 1 CAPSULE BY MOUTH IN THE EVENING   [DISCONTINUED] montelukast (SINGULAIR) 10 MG tablet TAKE 1 TABLET BY MOUTH AT BEDTIME FOR ALLERGIES   No facility-administered medications prior to visit.    Review of Systems     Objective    BP 127/60    Pulse 84    Resp 18    Wt 189 lb 11.2 oz (86 kg)    SpO2 95%    BMI 31.57 kg/m    Physical Exam Vitals and nursing note reviewed.  Constitutional:      General: She is not in acute distress.    Appearance: Normal appearance. She is obese. She is not ill-appearing, toxic-appearing or diaphoretic.  HENT:     Head: Normocephalic and atraumatic.  Cardiovascular:     Rate and Rhythm: Normal rate and regular rhythm.     Pulses: Normal pulses.     Heart sounds: Normal heart sounds. No murmur heard.   No friction rub. No gallop.  Pulmonary:     Effort: Pulmonary effort is normal. No respiratory distress.     Breath sounds: Normal breath sounds. No stridor. No wheezing, rhonchi or rales.  Chest:     Chest wall: No tenderness.  Abdominal:     General: Bowel sounds are normal.     Palpations: Abdomen is soft.  Musculoskeletal:        General: No swelling, tenderness, deformity or signs of injury. Normal range of motion.     Right lower leg: No edema.     Left lower leg: No edema.  Skin:    General: Skin is warm and dry.     Capillary Refill: Capillary refill takes less than 2 seconds.     Coloration: Skin is not jaundiced or pale.     Findings: No bruising, erythema, lesion or rash.  Neurological:     General: No focal deficit present.     Mental Status: She is alert and oriented to person, place, and time. Mental status is at baseline.     Cranial Nerves: No cranial nerve deficit.     Sensory: No sensory deficit.     Motor: No weakness.     Coordination:  Coordination normal.  Psychiatric:        Mood and Affect: Mood normal.        Behavior: Behavior normal.        Thought Content: Thought content normal.        Judgment: Judgment normal.     Results for orders placed or performed in visit on 09/25/21  POCT glycosylated hemoglobin (Hb A1C)  Result Value Ref Range   Hemoglobin A1C 7.9 (A) 4.0 - 5.6 %   HbA1c POC (<> result, manual entry)     HbA1c, POC (prediabetic range)     HbA1c, POC (controlled diabetic range)      Assessment & Plan     Problem List Items Addressed  This Visit       Cardiovascular and Mediastinum   Hypertension associated with diabetes ([place])    Chronic, stable Continue goal of <130/<80 No concerns with medication use/compliance      Relevant Medications   diltiazem (CARDIZEM CD) 240 MG 24 hr capsule     Respiratory   Seasonal allergic rhinitis due to pollen    Constant state of congestion Encouraged increase use of Flonase to two/day; add two sprays; blow nose prior to use Wash/shower before bed Continue daily use of Claritin or similar Continue Singulair Continue eye protection with glasses and mask wearing Referral to allergen specialist       Relevant Medications   montelukast (SINGULAIR) 10 MG tablet   Other Relevant Orders   Ambulatory referral to Allergy     Endocrine   T2DM (type 2 diabetes mellitus) ([place]) - Primary    <3 months since last A1c; slight improvement Notes can make more improvements with diet Helps other prep food, and doesn't want to do the same for herself -has joined silver sneakers Was walking 1.5 miles per day when the weather is nice RTC in 3 months Refused additional referral to lifestyle center for additional educuation      Relevant Orders   POCT glycosylated hemoglobin (Hb A1C) (Completed)     Nervous and Auditory   Cervical radiculitis    Chronic stable; request refill      Relevant Medications   cyclobenzaprine (FLEXERIL) 5 MG tablet   traZODone  (DESYREL) 50 MG tablet   temazepam (RESTORIL) 7.5 MG capsule     Other   Snoring    Refused referral at this time  Trial of medications to assist with insomnia Discussion regarding additional co morbid conditions associated with OSA if left untreated; pt voiced understanding       Psychophysiological insomnia    Ongoing concern; request trial of two medications Complaints of snoring Refused sleep study Never able to sleep well in a foreign space Denies tiredness during the day; denies choking; denies excess snoring or apnea per report of spouse      Relevant Medications   traZODone (DESYREL) 50 MG tablet   temazepam (RESTORIL) 7.5 MG capsule     Return in about 3 months (around 12/24/2021) for chonic disease management.      Vonna Kotyk, FNP, have reviewed all documentation for this visit. The documentation on 09/25/21 for the exam, diagnosis, procedures, and orders are all accurate and complete.  Patient seen and examined by [name],  FNP note scribed by [employer], [place], [place] [phone] (phone) [phone] (fax)  [place]

## 2021-09-25 NOTE — Assessment & Plan Note (Signed)
Chronic, stable Continue goal of <130/<80 No concerns with medication use/compliance

## 2021-09-25 NOTE — Assessment & Plan Note (Signed)
Chronic stable; request refill

## 2021-09-25 NOTE — Assessment & Plan Note (Signed)
Constant state of congestion Encouraged increase use of Flonase to two/day; add two sprays; blow nose prior to use Wash/shower before bed Continue daily use of Claritin or similar Continue Singulair Continue eye protection with glasses and mask wearing Referral to allergen specialist

## 2021-09-25 NOTE — Assessment & Plan Note (Signed)
Refused referral at this time  Trial of medications to assist with insomnia Discussion regarding additional co morbid conditions associated with OSA if left untreated; pt voiced understanding

## 2021-09-25 NOTE — Assessment & Plan Note (Signed)
Ongoing concern; request trial of two medications Complaints of snoring Refused sleep study Never able to sleep well in a foreign space Denies tiredness during the day; denies choking; denies excess snoring or apnea per report of spouse

## 2021-09-28 ENCOUNTER — Ambulatory Visit: Payer: BC Managed Care – PPO

## 2021-10-05 ENCOUNTER — Other Ambulatory Visit: Payer: Self-pay

## 2021-10-05 ENCOUNTER — Ambulatory Visit: Payer: BC Managed Care – PPO

## 2021-10-05 DIAGNOSIS — N3946 Mixed incontinence: Secondary | ICD-10-CM

## 2021-10-05 DIAGNOSIS — M6281 Muscle weakness (generalized): Secondary | ICD-10-CM

## 2021-10-05 DIAGNOSIS — R278 Other lack of coordination: Secondary | ICD-10-CM

## 2021-10-05 DIAGNOSIS — M533 Sacrococcygeal disorders, not elsewhere classified: Secondary | ICD-10-CM

## 2021-10-05 DIAGNOSIS — R2689 Other abnormalities of gait and mobility: Secondary | ICD-10-CM | POA: Diagnosis not present

## 2021-10-05 NOTE — Therapy (Signed)
[place] MAIN [facility] SERVICES [address] [place], [place], [address] Phone: [phone]   Fax:  [phone]  Physical Therapy Treatment  Patient Details  Name: Molly Potter MRN: [id number] Date of Birth: [birth year] No data recorded  Encounter Date: 10/05/2021   PT End of Session - 10/05/21 0948     Visit Number 9    Number of Visits 10    Date for PT Re-Evaluation 10/19/21    Authorization Type BCBS Medicare: 30 PT/OT combined    Authorization Time Period 7/30 in 2022, not sure if visits start over in Jan?    Authorization - Visit Number 9    Authorization - Number of Visits 30    Progress Note Due on Visit 10    PT Start Time 0904    PT Stop Time 0958    PT Time Calculation (min) 54 min    Activity Tolerance Patient tolerated treatment well    Behavior During Therapy [facility] for tasks assessed/performed             Past Medical History:  Diagnosis Date   Arthritis    Diabetes mellitus without complication ([place])    diet controlled   Fatigue    GERD (gastroesophageal reflux disease)    History of eczema    Hypertension    Hyperthyroidism    Joint pain    PONV (postoperative nausea and vomiting)     Past Surgical History:  Procedure Laterality Date   ABDOMINAL HYSTERECTOMY  01/2021   CYSTOCELE REPAIR N/A 02/02/2021   Procedure: ANTERIOR REPAIR (CYSTOCELE), MODIFIED MCCALLS CULDOPLASTY;  Surgeon: Benjaman Kindler, MD;  Location: [facility] ORS;  Service: Gynecology;  Laterality: N/A;   CYSTOSCOPY N/A 02/02/2021   Procedure: CYSTOSCOPY;  Surgeon: Benjaman Kindler, MD;  Location: [facility] ORS;  Service: Gynecology;  Laterality: N/A;   LAPAROSCOPIC ASSISTED VAGINAL HYSTERECTOMY N/A 02/02/2021   Procedure: LAPAROSCOPIC ASSISTED VAGINAL HYSTERECTOMY;  Surgeon: Benjaman Kindler, MD;  Location: [facility] ORS;  Service: Gynecology;  Laterality: N/A;   LAPAROSCOPIC BILATERAL SALPINGECTOMY Bilateral 02/02/2021   Procedure: LAPAROSCOPIC BILATERAL  SALPINGECTOMY AND OOPHERECTOMY;  Surgeon: Benjaman Kindler, MD;  Location: [facility] ORS;  Service: Gynecology;  Laterality: Bilateral;   ROTATOR CUFF REPAIR Right    TUBAL LIGATION      There were no vitals filed for this visit.   Subjective Assessment - 10/05/21 0906     Subjective Pt reported she was really tired last week and had to work, so she had to cancel her appt. She's been helping her daughter who doesn't have a vehicle. Pt is still walking at [facility]. She had to start taking stool softners again, as she only had one bowel movement in two weeks but reported she's trying to eat healthier (eating less sweets, eat more salads, smaller portions) to get sugar levels down. Pt stated HEP is going well this week but not as frequent.    Pertinent History HTN, hyperthyroidism, HLD, DM, Cx radiculitis, Lx radiculopathy, arthiritis, L rotator cuff rupture, OA, cystocele, eczema, cystocele repair and hysterectomy on 02/02/21    Patient Stated Goals "To be stronger (my core needs to be stronger).    Currently in Pain? No/denies                               [facility] Adult PT Treatment/Exercise - 10/05/21 0914       Manual Therapy   Manual Therapy Soft tissue mobilization;Joint mobilization;Muscle  Energy Technique;Passive ROM    Manual therapy comments Pt amb. with improved heel strike, stride length and trunk rotation.    Joint Mobilization PT performed talocrural AP jt. mob: grade 3, 2x30 sec. bouts to improve B ankle dorsiflexion. followed by supine B calf stretch performed by PT 3x30 sec. holds.    Soft tissue mobilization PT performed STM and trigger point to L IT band and B quad musculature and B hip add. to decr. tension.    Passive ROM PT performed B piriformis stretch (figure four and knee to contralat. shoulder) 4x30sec.    Muscle Energy Technique Contract and relax of B hip flexors and extensors 2x10 sec. holds.                   SELF CARE: Including 5 minutes  meditation with breathing. Pt reported "this really works" afterwards and reported less tension/stress.  PT Education - 10/05/21 0946     Education Details PT discussed the importance of even 10 minutes of power walking per day. PT discussed how HEP is a toolbox to use depending on what pt needs to improve that day. PT educated pt on how meditation can assist to decr. stress and tension and performed 5 minute meditation on peloton app while PT instructed pt on diaphragmatic breathing and lengthening pelvic floor with inhalation, as tension can incr. pelvic pain.    Person(s) Educated Patient    Methods Explanation;Demonstration;Verbal cues    Comprehension Verbalized understanding;Returned demonstration;Need further instruction              PT Short Term Goals - 08/25/21 0901       PT SHORT TERM GOAL #1   Title Pt will be IND in HEP to improve strength, posture, and flexibility in order to improve functional mobility and pain.    Baseline No HEP    Time 4    Period Weeks    Status Achieved    Target Date 07/28/21      PT SHORT TERM GOAL #2   Title Pt will be able to coordinate PFM to decr. urge incontinence and report nocturia 0-1/night to improve QOL.    Baseline 2-3x/week; 11/22: she feels sometimes better, sometimes worse as she is now mindful of it-approx. several a times week, nocturia 2-3 times per night    Time 4    Period Weeks    Status Not Met    Target Date 07/28/21      PT SHORT TERM GOAL #3   Title Pt will demonstrate improve posture, alignment and hip strength surrounding pelvis to decr. pelvic pain during intercourse and job activities.    Baseline Incr. lx spine lordosis, decr. post. pelvic tilt and 3+/5 hip abd. (B), 11/22: Lx spine lordosis still present but somewhat improved, pelvic tilt improved and B hip abd: 4-/5    Time 4    Period Weeks    Status Partially Met               PT Long Term Goals - 10/05/21 0951       PT LONG TERM GOAL #1   Title  Pt will complete FOTO and write goal as indicated.    Baseline no FOTO    Time 8    Period Weeks    Status Achieved      PT LONG TERM GOAL #2   Title Pt will demonstrate proper toileting posture, PFM coordination, and verbalize strategies to incr. bowel frequency to every 3 days  to decr. discomfort.    Baseline Once every week with medication, 09/21/21: every other day to every day.    Time 8    Period Weeks    Status Achieved      PT LONG TERM GOAL #3   Title Pt will improve pelvic floor muscle coordination and alignment in order to report 0 episodes of urge incontinence over the last four weeks.    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 in LBP and pelvic pain.    Baseline 09/21/21: pt reported not much pelvic pain but LBP is still present. Pt feels 50-60% better.    Time 10    Period Weeks    Status Partially Met      PT LONG TERM GOAL #5   Title Pt will improve FOTO score to >/=60 on urinary problems and >/=54 on bowel constipation sections to improve QOL.    Baseline 51: urinary promblem and 61: bowel constipation.    Time 10    Period Weeks    Status Deferred      PT LONG TERM GOAL #6   Title Pt will amb. 1.5 miles with improved posture without reports of incr. back or pelvic pain, in order to exercise.    Time 4    Period Weeks    Status New    Target Date 10/19/21                   Plan - 10/05/21 0949     Clinical Impression Statement Pt noted to have incr. tension and trigger points in BLEs, which improved after manual therapy. Pt's gait deviations also improved after manual therapy. Pt continues to experience bowel issues, pelvic pain, decr. strength, gait  deviations and decr. ROM and flexibility. Pt would continue to benefit from skilled PT to improve deficits listed above.    Personal Factors and Comorbidities Comorbidity 3+;Age;Profession    Comorbidities HTN, hyperthyroidism, HLD, DM, Cx radiculitis, Lx radiculopathy, arthiritis, L rotator cuff rupture, OA, cystocele, eczema, cystocele repair and hysterectomy on 02/02/21    Examination-Activity Limitations [place];Caring for Others;Carry;Continence;Locomotion Level;Lift    Examination-Participation Restrictions Cleaning;Laundry;Occupation;Meal Prep    PT Frequency 1x / week    PT Duration 4 weeks   original POC: 1x/week for 10 weeks   PT Treatment/Interventions ADLs/Self Care Home Management;Gait training;Stair training;Functional mobility training;Therapeutic activities;Neuromuscular re-education;Balance training;Therapeutic exercise;Patient/family education;Manual techniques;Joint Manipulations;Moist Heat;Cryotherapy    PT Next Visit Plan Review HEP and progress prn (foot and ankle). internal exam prn. Manual therapy hips, add prn.    PT Home Exercise Plan [email or web address]    Consulted and Agree with Plan of Care Patient             Patient will benefit from skilled therapeutic intervention in order to improve the following deficits and impairments:  Abnormal gait, Decreased balance, Hypomobility, Pain, Postural dysfunction, Impaired flexibility, Decreased strength, Decreased range of motion, Decreased coordination  Visit Diagnosis: Mixed incontinence  Sacrococcygeal disorders, not elsewhere classified  Other abnormalities of gait and mobility  Muscle weakness (generalized)  Other lack of coordination     Problem List Patient Active Problem List  Diagnosis Date Noted   Snoring 09/25/2021   Psychophysiological insomnia 09/25/2021   Seasonal allergic rhinitis due to pollen 09/25/2021   Pelvic organ prolapse quantification stage 2 cystocele 02/02/2021   Statin medication declined by  patient 07/28/2020   Lumbar radiculopathy 07/21/2020   T2DM (type 2 diabetes mellitus) ([place]) 01/03/2019   Hyperlipidemia associated with type 2 diabetes mellitus ([place]) 06/20/2018   Primary osteoarthritis of both knees 02/16/2017   Impingement syndrome of shoulder 07/02/2015   Complete rotator cuff rupture of left shoulder 07/02/2015   Cervical radiculitis 07/02/2015   Impingement syndrome of both shoulders 07/02/2015   Arthritis sicca 04/09/2015   Hypertension associated with diabetes ([place]) 04/09/2015   H/O eczema 04/09/2015   Hyperthyroidism 04/09/2015   Arthritis 04/09/2015    Verne Cove L, PT 10/05/2021, 10:00 AM  [facility] [place], [place], [address] Phone: [phone]   Fax:  [phone]  Name: CORTASIA SCREWS MRN: [id number] Date of Birth: 1956-07-07   Geoffry Paradise, PT,DPT 10/05/21 10:01 AM Phone: [phone] Fax: [phone]

## 2021-10-13 ENCOUNTER — Other Ambulatory Visit: Payer: Self-pay

## 2021-10-13 ENCOUNTER — Ambulatory Visit: Payer: BC Managed Care – PPO

## 2021-10-13 DIAGNOSIS — N3946 Mixed incontinence: Secondary | ICD-10-CM | POA: Diagnosis not present

## 2021-10-13 DIAGNOSIS — R278 Other lack of coordination: Secondary | ICD-10-CM | POA: Diagnosis not present

## 2021-10-13 DIAGNOSIS — M533 Sacrococcygeal disorders, not elsewhere classified: Secondary | ICD-10-CM

## 2021-10-13 DIAGNOSIS — R2689 Other abnormalities of gait and mobility: Secondary | ICD-10-CM

## 2021-10-13 DIAGNOSIS — M6281 Muscle weakness (generalized): Secondary | ICD-10-CM | POA: Diagnosis not present

## 2021-10-13 NOTE — Therapy (Addendum)
[place] MAIN [facility] SERVICES [address] [place], [place], [address] Phone: [phone]   Fax:  [phone]  Physical Therapy Treatment-progress note  Patient Details  Name: Molly Potter MRN: [id number] Date of Birth: 14-Jan-1956 No data recorded  Encounter Date: 10/13/2021  Progress Note Reporting Period  to 10/13/21  See note below for Objective Data and Assessment of Progress/Goals.       PT End of Session - 10/13/21 1006     Visit Number 10    Number of Visits 10    Date for PT Re-Evaluation 10/19/21    Authorization Type BCBS Medicare: 30 PT/OT combined    Authorization Time Period 7/30 in 2022, not sure if visits start over in Jan?    Authorization - Visit Number 10    Authorization - Number of Visits 30    Progress Note Due on Visit 10    PT Start Time 0904    PT Stop Time 0959    PT Time Calculation (min) 55 min    Activity Tolerance Patient tolerated treatment well    Behavior During Therapy WFL for tasks assessed/performed             Past Medical History:  Diagnosis Date   Arthritis    Diabetes mellitus without complication ([place])    diet controlled   Fatigue    GERD (gastroesophageal reflux disease)    History of eczema    Hypertension    Hyperthyroidism    Joint pain    PONV (postoperative nausea and vomiting)     Past Surgical History:  Procedure Laterality Date   ABDOMINAL HYSTERECTOMY  01/2021   CYSTOCELE REPAIR N/A 02/02/2021   Procedure: ANTERIOR REPAIR (CYSTOCELE), MODIFIED MCCALLS CULDOPLASTY;  Surgeon: Benjaman Kindler, MD;  Location: [facility] ORS;  Service: Gynecology;  Laterality: N/A;   CYSTOSCOPY N/A 02/02/2021   Procedure: CYSTOSCOPY;  Surgeon: Benjaman Kindler, MD;  Location: [facility] ORS;  Service: Gynecology;  Laterality: N/A;   LAPAROSCOPIC ASSISTED VAGINAL HYSTERECTOMY N/A 02/02/2021   Procedure: LAPAROSCOPIC ASSISTED VAGINAL HYSTERECTOMY;  Surgeon: Benjaman Kindler, MD;  Location: [facility]  ORS;  Service: Gynecology;  Laterality: N/A;   LAPAROSCOPIC BILATERAL SALPINGECTOMY Bilateral 02/02/2021   Procedure: LAPAROSCOPIC BILATERAL SALPINGECTOMY AND OOPHERECTOMY;  Surgeon: Benjaman Kindler, MD;  Location: [facility] ORS;  Service: Gynecology;  Laterality: Bilateral;   ROTATOR CUFF REPAIR Right    TUBAL LIGATION      There were no vitals filed for this visit.   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 She tried to engage core but still had issues. Pt also performed hip/LE/back stretches that decr. pain last night. Pt denied pain during intercourse this morning. Pt is still stool softners which helped her to have bowel movements daily for three days. Pt hasn't tried eating oatmeal with fruit yet. Pt hasn't walked this week, she switched to [employer] so she doesn't have to walk up 19 stairs at the [facility]. Pt hasn't tried meditation yet but is starting to get in the bed earlier to try meditation.    Pertinent History HTN, hyperthyroidism, HLD, DM, Cx radiculitis, Lx radiculopathy, arthiritis, L rotator cuff rupture, OA, cystocele, eczema, cystocele repair and hysterectomy on 02/02/21    Patient Stated Goals "To be stronger (my core needs to be stronger).    Currently in Pain? No/denies  NMR and theract: Access Code: [email or web address] URL: [email or web address] Date: 10/13/2021 Prepared by: Geoffry Paradise  Exercises Supine Lower Trunk Rotation - 1 x daily - 7 x weekly - 1 sets - 3 reps - 30 hold NMR Beginner Bridge - 1 x daily - 3 x weekly - 3 sets - 10 reps NMR Backward Walking with Counter Support - 1 x daily - 7 x weekly - 1 sets - 4 reps NMR B hip ext at counter with support standing-x5 reps-too easy for pt.  Patient Education-theract Assisted Squat-Pivot Bed Transfer To Armchair Scooting edge of bed Supine  to sit txfs PT had pt perform all txfs and then practice with PT being the pt. Cues and demo for head-hips relationship, proper posture, engaging deep core. PT also demonstrated how to switch staggered stance during mopping/vacuuming with core engaged and pt performed.  Extensive demo and cues required, performed with S for safety. Gait belt education as well as how to set up a room for success.                         PT Education - 10/13/21 1005     Education Details PT spent extensive time educating pt on safe txf techniques and household chore techniques to decr. back pain and intraabdominal pressure during work activities. Added hip ext. (or progressed) to HEP.    Person(s) Educated Patient    Methods Explanation;Demonstration;Tactile cues;Verbal cues;Handout    Comprehension Need further instruction;Returned demonstration;Verbalized understanding              PT Short Term Goals - 08/25/21 0901       PT SHORT TERM GOAL #1   Title Pt will be IND in HEP to improve strength, posture, and flexibility in order to improve functional mobility and pain.    Baseline No HEP    Time 4    Period Weeks    Status Achieved    Target Date 07/28/21      PT SHORT TERM GOAL #2   Title Pt will be able to coordinate PFM to decr. urge incontinence and report nocturia 0-1/night to improve QOL.    Baseline 2-3x/week; 11/22: she feels sometimes better, sometimes worse as she is now mindful of it-approx. several a times week, nocturia 2-3 times per night    Time 4    Period Weeks    Status Not Met    Target Date 07/28/21      PT SHORT TERM GOAL #3   Title Pt will demonstrate improve posture, alignment and hip strength surrounding pelvis to decr. pelvic pain during intercourse and job activities.    Baseline Incr. lx spine lordosis, decr. post. pelvic tilt and 3+/5 hip abd. (B), 11/22: Lx spine lordosis still present but somewhat improved, pelvic tilt improved and B hip abd:  4-/5    Time 4    Period Weeks    Status Partially Met               PT Long Term Goals - 10/05/21 0951       PT LONG TERM GOAL #1   Title Pt will complete FOTO and write goal as indicated.    Baseline no FOTO    Time 8    Period Weeks    Status Achieved      PT LONG TERM GOAL #2   Title Pt will demonstrate proper toileting posture, PFM coordination, and verbalize strategies to incr. bowel frequency to every  3 days to decr. discomfort.    Baseline Once every week with medication, 09/21/21: every other day to every day.    Time 8    Period Weeks    Status Achieved      PT LONG TERM GOAL #3   Title Pt will improve pelvic floor muscle coordination and alignment in order to report 0 episodes of urge incontinence over the last four weeks.    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 in LBP and pelvic pain.    Baseline 09/21/21: pt reported not much pelvic pain but LBP is still present. Pt feels 50-60% better.    Time 10    Period Weeks    Status Partially Met      PT LONG TERM GOAL #5   Title Pt will improve FOTO score to >/=60 on urinary problems and >/=54 on bowel constipation sections to improve QOL.    Baseline 51: urinary promblem and 61: bowel constipation.    Time 10    Period Weeks    Status Deferred      PT LONG TERM GOAL #6   Title Pt will amb. 1.5 miles with improved posture without reports of incr. back or pelvic pain, in order to exercise.    Time 4    Period Weeks    Status New    Target Date 10/19/21                   Plan - 10/13/21 1006     Clinical Impression Statement Skilled PT session focused on performing work duties to [employer]. back pain and  intraabdominal pressure, as pt reporting incr. back pain during work. Pt's B hip ext 3+/5. Pt progressed to less cues during mock txfs and demonstrated improved posture and technique. However, pt's B knee flex impacts pt's posture and can lead to incr. BLE/back/hip pain. Pt would continue to benefit from skilled PT to reduce pain, leakage, posture, balance, and strength. PT will assess goals next session and either renew or d/c based on progress, one visit added 2/2 pt missing one week of PT.    PT Treatment/Interventions ADLs/Self Care Home Management;Gait training;Stair training;Functional mobility training;Therapeutic activities;Neuromuscular re-education;Balance training;Therapeutic exercise;Patient/family education;Manual techniques;Joint Manipulations;Moist Heat;Cryotherapy    PT Next Visit Plan Check goals and either d/c or renew.    PT Home Exercise Plan [email or web address]    Consulted and Agree with Plan of Care Patient             Patient will benefit from skilled therapeutic intervention in order to improve the following deficits and impairments:  Abnormal gait, Decreased balance, Hypomobility, Pain, Postural dysfunction, Impaired flexibility, Decreased strength, Decreased range of motion, Decreased coordination  Visit Diagnosis: Mixed incontinence  Other abnormalities of gait and mobility  Muscle weakness (generalized)  Other lack of coordination  Sacrococcygeal disorders, not elsewhere classified     Problem List Patient Active Problem List   Diagnosis Date Noted   Snoring 09/25/2021   Psychophysiological insomnia 09/25/2021   Seasonal allergic rhinitis due to pollen 09/25/2021   Pelvic organ prolapse quantification stage 2 cystocele 02/02/2021   Statin medication declined  by patient 07/28/2020   Lumbar radiculopathy 07/21/2020   T2DM (type 2 diabetes mellitus) ([place]) 01/03/2019   Hyperlipidemia associated with type 2 diabetes mellitus ([place]) 06/20/2018   Primary  osteoarthritis of both knees 02/16/2017   Impingement syndrome of shoulder 07/02/2015   Complete rotator cuff rupture of left shoulder 07/02/2015   Cervical radiculitis 07/02/2015   Impingement syndrome of both shoulders 07/02/2015   Arthritis sicca 04/09/2015   Hypertension associated with diabetes ([place]) 04/09/2015   H/O eczema 04/09/2015   Hyperthyroidism 04/09/2015   Arthritis 04/09/2015    Cassandre Oleksy L, PT 10/13/2021, 10:10 AM  [place] MAIN [facility] SERVICES [address] [place], [place], [address] Phone: [phone]   Fax:  [phone]  Name: ADDALIE CALLES MRN: [id number] Date of Birth: 01/04/1956   Geoffry Paradise, PT,DPT 10/13/21 10:12 AM Phone: [phone] Fax: [phone]

## 2021-10-13 NOTE — Patient Instructions (Addendum)
Access Code: [email or web address] URL: [email or web address] Date: 10/13/2021 Prepared by: Geoffry Paradise  Exercises Clamshell - 1 x daily - 3 x weekly - 3 sets - 10 reps - 1-2 hold Supine Diaphragmatic Breathing - 3 x daily - 7 x weekly - 1 sets - 5 reps - 2 hold Supine Hip Adductor Stretch - 2-3 x daily - 7 x weekly - 1 sets - 3 reps - 30-60 hold Supine Piriformis Stretch with Leg Straight - 1 x daily - 7 x weekly - 1 sets - 3 reps - 30-60 hold Supine Pelvic Floor Contraction - 1 x daily - 7 x weekly - 1 sets - 10 reps Supine Pelvic Tilt - 1 x daily - 7 x weekly - 1 sets - 10 reps Happy Baby with Pelvic Floor Lengthening - 1 x daily - 7 x weekly - 1 sets - 3 reps - 30-45 hold Supine Lower Trunk Rotation - 1 x daily - 7 x weekly - 1 sets - 3 reps - 30 hold Beginner Bridge - 1 x daily - 3 x weekly - 3 sets - 10 reps Supine Transversus Abdominis Bracing with Leg Extension - 1 x daily - 3 x weekly - 2 sets - 10 reps Backward Walking with Counter Support - 1 x daily - 7 x weekly - 1 sets - 4 reps  Patient Education Assisted Squat-Pivot Bed Transfer To [employer]

## 2021-11-03 ENCOUNTER — Ambulatory Visit: Payer: BC Managed Care – PPO | Attending: Obstetrics and Gynecology

## 2021-12-23 NOTE — Progress Notes (Signed)
?  ? ?[employer] as a Education administrator for Gwyneth Sprout, FNP.,have documented all relevant documentation on the behalf of Gwyneth Sprout, FNP,as directed by  Gwyneth Sprout, FNP while in the presence of Gwyneth Sprout, FNP.  ? ?Established patient visit ? ? ?Patient: Molly Potter   DOB: Jan 07, 1956   66 y.o. Female  MRN: [id number] ?Visit Date: 12/24/2021 ? ?Today's healthcare provider: Gwyneth Sprout, FNP  ? ?Re Introduced to nurse practitioner role and practice setting.  All questions answered.  Discussed provider/patient relationship and expectations. ? ? ?Chief Complaint  ?Patient presents with  ? Hypertension  ? Diabetes  ? Hyperlipidemia  ? ?Subjective  ?  ?HPI  ?Diabetes Mellitus Type II, follow-up ? ?Lab Results  ?Component Value Date  ? HGBA1C 9.0 (A) 12/24/2021  ? HGBA1C 7.9 (A) 09/25/2021  ? HGBA1C 8.4 (H) 07/20/2021  ? ?Last seen for diabetes 3 months ago.  ?Management since then includes continuing the same treatment. ? ?Home blood sugar records:  not checked ? ?Episodes of hypoglycemia? No  ?  ?Current insulin regiment: none ?Most Recent Eye Exam:  UTD; will send for results from [facility], seen July/August 2022 ? ?--------------------------------------------------------------------------------------------------- ?Hypertension, follow-up ? ?BP Readings from Last 3 Encounters:  ?12/24/21 113/77  ?09/25/21 127/60  ?07/20/21 138/76  ? Wt Readings from Last 3 Encounters:  ?12/24/21 187 lb 14.4 oz (85.2 kg)  ?09/25/21 189 lb 11.2 oz (86 kg)  ?07/20/21 190 lb 14.4 oz (86.6 kg)  ?  ? ?She was last seen for hypertension 3 months ago.  ?BP at that visit was 127/60. ?Management since that visit includes none. ?She reports excellent compliance with treatment. ?She is not having side effects.  ?She is not exercising. ?She is not adherent to low salt diet.   ?Outside blood pressures are recent readings systolic 948-546 and diastolic 27-03J. ? ?She does not smoke. ? ?Use of agents  associated with hypertension: none.  ? ?--------------------------------------------------------------------------------------------------- ?Lipid/Cholesterol, follow-up ? ?Last Lipid Panel: ?Lab Results  ?Component Value Date  ? CHOL 142 07/20/2021  ? [place] 72 07/20/2021  ? HDL 46 07/20/2021  ? TRIG 137 07/20/2021  ? ? ?She was last seen for this 3 months ago.  ?Management since that visit includes none.Patient states that in January she d/c Crestor due to labs being stable ? ?Symptoms: ?No appetite changes No foot ulcerations  ?No chest pain No chest pressure/discomfort  ?No dyspnea No orthopnea  ?No fatigue No lower extremity edema  ?No palpitations No paroxysmal nocturnal dyspnea  ?No nausea No numbness or tingling of extremity  ?No polydipsia No polyuria  ?No speech difficulty No syncope  ? ?She is following a Regular diet. ?Current exercise: no regular exercise ? ?Last metabolic panel ?Lab Results  ?Component Value Date  ? GLUCOSE 173 (H) 07/20/2021  ? NA 141 07/20/2021  ? K 4.5 07/20/2021  ? BUN 10 07/20/2021  ? CREATININE 0.81 07/20/2021  ? EGFR 81 07/20/2021  ? GFRNONAA >60 02/03/2021  ? CALCIUM 9.6 07/20/2021  ? AST 13 07/20/2021  ? ALT 12 07/20/2021  ? ?The 10-year ASCVD risk score (Arnett DK, et al., 2019) is: 12% ? ?---------------------------------------------------------------------------------------------------  ? ?Medications: ?Outpatient Medications Prior to Visit  ?Medication Sig  ? acetaminophen (TYLENOL) 500 MG tablet Take 1,000 mg by mouth every 6 (six) hours as needed for moderate pain.  ? cholecalciferol (VITAMIN D3) 25 MCG (1000 UNIT) tablet Take 1,000 Units by mouth daily.  ?  cyclobenzaprine (FLEXERIL) 5 MG tablet Take 1 tablet (5 mg total) by mouth at bedtime.  ? diclofenac Sodium (VOLTAREN) 1 % GEL Apply 2 g topically daily as needed (pain).  ? diltiazem (CARDIZEM CD) 240 MG 24 hr capsule Take 1 capsule (240 mg total) by mouth daily.  ? docusate sodium (COLACE) 100 MG capsule Take 1  capsule (100 mg total) by mouth 2 (two) times daily. To keep stools soft  ? glucose blood (CONTOUR NEXT TEST) test strip To check BG BID  ? loratadine (CLARITIN) 10 MG tablet Take 10 mg by mouth daily as needed for allergies.  ? Microlet Lancets MISC To check BG BID  ? montelukast (SINGULAIR) 10 MG tablet Take 1 tablet (10 mg total) by mouth at bedtime.  ? Multiple Vitamin (MULTIVITAMIN) tablet Take 1 tablet by mouth daily.  ? Polyethyl Glycol-Propyl Glycol (SYSTANE OP) Place 1 drop into both eyes daily as needed (dry/irritated eyes).  ? temazepam (RESTORIL) 7.5 MG capsule Take 1 capsule (7.5 mg total) by mouth at bedtime as needed for sleep.  ? traZODone (DESYREL) 50 MG tablet Take 0.5-1 tablets (25-50 mg total) by mouth at bedtime as needed for sleep.  ? [DISCONTINUED] losartan (COZAAR) 50 MG tablet Take 2 tablets (100 mg total) by mouth daily.  ? [DISCONTINUED] rosuvastatin (CRESTOR) 5 MG tablet Take 1 tablet (5 mg total) by mouth daily. (Patient not taking: Reported on 12/24/2021)  ? ?No facility-administered medications prior to visit.  ? ? ?Review of Systems ? ? ?  Objective  ?  ?BP 113/77   Pulse 74   Temp 98.3 ?F (36.8 ?C) (Oral)   Resp 16   Wt 187 lb 14.4 oz (85.2 kg)   BMI 31.27 kg/m?  ? ? ?Physical Exam ?Vitals and nursing note reviewed.  ?Constitutional:   ?   General: She is not in acute distress. ?   Appearance: Normal appearance. She is obese. She is not ill-appearing, toxic-appearing or diaphoretic.  ?HENT:  ?   Head: Normocephalic and atraumatic.  ?Cardiovascular:  ?   Rate and Rhythm: Normal rate and regular rhythm.  ?   Pulses: Normal pulses.  ?   Heart sounds: Normal heart sounds. No murmur heard. ?  No friction rub. No gallop.  ?Pulmonary:  ?   Effort: Pulmonary effort is normal. No respiratory distress.  ?   Breath sounds: Normal breath sounds. No stridor. No wheezing, rhonchi or rales.  ?Chest:  ?   Chest wall: No tenderness.  ?Abdominal:  ?   General: Bowel sounds are normal.  ?    Palpations: Abdomen is soft.  ?Musculoskeletal:     ?   General: No swelling, tenderness, deformity or signs of injury. Normal range of motion.  ?   Right lower leg: No edema.  ?   Left lower leg: No edema.  ?Skin: ?   General: Skin is warm and dry.  ?   Capillary Refill: Capillary refill takes less than 2 seconds.  ?   Coloration: Skin is not jaundiced or pale.  ?   Findings: No bruising, erythema, lesion or rash.  ?Neurological:  ?   General: No focal deficit present.  ?   Mental Status: She is alert and oriented to person, place, and time. Mental status is at baseline.  ?   Cranial Nerves: No cranial nerve deficit.  ?   Sensory: No sensory deficit.  ?   Motor: No weakness.  ?   Coordination: Coordination normal.  ?Psychiatric:     ?  Mood and Affect: Mood normal.     ?   Behavior: Behavior normal.     ?   Thought Content: Thought content normal.     ?   Judgment: Judgment normal.  ? ? ?Results for orders placed or performed in visit on 12/24/21  ?POCT glycosylated hemoglobin (Hb A1C)  ?Result Value Ref Range  ? Hemoglobin A1C 9.0 (A) 4.0 - 5.6 %  ? HbA1c POC (<> result, manual entry)    ? HbA1c, POC (prediabetic range)    ? HbA1c, POC (controlled diabetic range)    ? ? Assessment & Plan  ?  ? ?Problem List Items Addressed This Visit   ? ?  ? Cardiovascular and Mediastinum  ? Hypertension associated with diabetes ([place])  ?  Chronic, stable ?Denies CP ?Denies SOB/ DOE ?Denies low blood pressure/hypotension ?Denies vision changes ?No LE Edema noted on exam ?Continue medication, Request Losartan to 1- 100 mg tablet, Dilt 24 hr 240 mg QD ?RTC in 3 months ?Seek emergent care if you develop chest pain or chest pressure ? ?  ?  ? Relevant Medications  ? losartan (COZAAR) 100 MG tablet  ? rosuvastatin (CRESTOR) 5 MG tablet  ?  ? Endocrine  ? Hyperlipidemia associated with type 2 diabetes mellitus ([place])  ?  Chronic, previous stable ?However, patient has stopped crestor 5 mg since 09/2021 d/t "controlled" results ?Further  education provided that use of medication is necessary to assist with meeting goal of LDL <70 mg/dL ?We recommend diet low in saturated fat and regular exercise - 30 min at least 5 times per week ?Patient willing to restart me

## 2021-12-24 ENCOUNTER — Encounter: Payer: Self-pay | Admitting: Family Medicine

## 2021-12-24 ENCOUNTER — Other Ambulatory Visit: Payer: Self-pay | Admitting: Family Medicine

## 2021-12-24 ENCOUNTER — Ambulatory Visit (INDEPENDENT_AMBULATORY_CARE_PROVIDER_SITE_OTHER): Payer: BC Managed Care – PPO | Admitting: Family Medicine

## 2021-12-24 VITALS — BP 113/77 | HR 74 | Temp 98.3°F | Resp 16 | Wt 187.9 lb

## 2021-12-24 DIAGNOSIS — E1169 Type 2 diabetes mellitus with other specified complication: Secondary | ICD-10-CM | POA: Diagnosis not present

## 2021-12-24 DIAGNOSIS — E785 Hyperlipidemia, unspecified: Secondary | ICD-10-CM

## 2021-12-24 DIAGNOSIS — I152 Hypertension secondary to endocrine disorders: Secondary | ICD-10-CM

## 2021-12-24 DIAGNOSIS — E1159 Type 2 diabetes mellitus with other circulatory complications: Secondary | ICD-10-CM

## 2021-12-24 LAB — POCT GLYCOSYLATED HEMOGLOBIN (HGB A1C): Hemoglobin A1C: 9 % — AB (ref 4.0–5.6)

## 2021-12-24 MED ORDER — ROSUVASTATIN CALCIUM 5 MG PO TABS: 5.0000 mg | ORAL_TABLET | Freq: Every day | ORAL | 1 refills | Status: DC

## 2021-12-24 MED ORDER — LOSARTAN POTASSIUM 100 MG PO TABS
100.0000 mg | ORAL_TABLET | Freq: Every day | ORAL | 1 refills | Status: DC
Start: 2021-12-24 — End: 2022-06-24

## 2021-12-24 NOTE — Assessment & Plan Note (Signed)
Chronic, stable ?Denies CP ?Denies SOB/ DOE ?Denies low blood pressure/hypotension ?Denies vision changes ?No LE Edema noted on exam ?Continue medication, Request Losartan to 1- 100 mg tablet, Dilt 24 hr 240 mg QD ?RTC in 3 months ?Seek emergent care if you develop chest pain or chest pressure ? ?

## 2021-12-24 NOTE — Assessment & Plan Note (Signed)
Chronic, previous stable ?However, patient has stopped crestor 5 mg since 09/2021 d/t "controlled" results ?Further education provided that use of medication is necessary to assist with meeting goal of LDL <70 mg/dL ?We recommend diet low in saturated fat and regular exercise - 30 min at least 5 times per week ?Patient willing to restart medication; new Rx provided ?RTC in 3 months and plan to check labs at that time ?

## 2021-12-24 NOTE — Assessment & Plan Note (Signed)
Chronic, uncontrolled ?A1c now 9% today ?-Continue to recommend balanced, lower carb meals. Smaller meal size, adding snacks. Choosing water as drink of choice and increasing purposeful exercise. ?-Has previously gone to [facility] for DM teaching ?-Previously controlled with diet/exercise; if remains elevated in 3 months at f/u recommend start of Metformin XR 750 mg BID ?

## 2021-12-26 LAB — MICROALBUMIN / CREATININE URINE RATIO
Creatinine, Urine: 159.2 mg/dL
Microalb/Creat Ratio: 6 mg/g creat (ref 0–29)
Microalbumin, Urine: 9.5 ug/mL

## 2022-02-01 ENCOUNTER — Ambulatory Visit: Payer: PPO | Attending: Obstetrics and Gynecology

## 2022-02-01 ENCOUNTER — Other Ambulatory Visit: Payer: Self-pay

## 2022-02-01 DIAGNOSIS — M6281 Muscle weakness (generalized): Secondary | ICD-10-CM | POA: Diagnosis not present

## 2022-02-01 DIAGNOSIS — R278 Other lack of coordination: Secondary | ICD-10-CM | POA: Diagnosis not present

## 2022-02-01 DIAGNOSIS — M533 Sacrococcygeal disorders, not elsewhere classified: Secondary | ICD-10-CM | POA: Insufficient documentation

## 2022-02-01 DIAGNOSIS — R2689 Other abnormalities of gait and mobility: Secondary | ICD-10-CM | POA: Diagnosis not present

## 2022-02-01 DIAGNOSIS — N3946 Mixed incontinence: Secondary | ICD-10-CM | POA: Insufficient documentation

## 2022-02-01 NOTE — Therapy (Signed)
[place] [facility] [address] [place], [place], [address] Phone: [phone]   Fax:  [phone]  Physical Therapy Treatment/Re-cert  Patient Details  Name: Molly Potter MRN: [id number] Date of Birth: [birth year] No data recorded  Encounter Date: 02/01/2022   PT End of Session - 02/01/22 1022     Visit Number 11    Number of Visits 16   original POC 10/19/21 with 10 visits   Date for PT Re-Evaluation 04/02/22    Authorization Type BCBS Medicare: 30 PT/OT combined    Authorization Time Period 7/30 in 2022, not sure if visits start over in Jan?    Authorization - Visit Number 11   not sure when this starts over?   Authorization - Number of Visits 30    Progress Note Due on Visit 10    PT Start Time 0904    PT Stop Time 1001    PT Time Calculation (min) 57 min    Activity Tolerance Patient tolerated treatment well    Behavior During Therapy WFL for tasks assessed/performed             Past Medical History:  Diagnosis Date   Arthritis    Diabetes mellitus without complication ([place])    diet controlled   Fatigue    GERD (gastroesophageal reflux disease)    History of eczema    Hypertension    Hyperthyroidism    Joint pain    PONV (postoperative nausea and vomiting)     Past Surgical History:  Procedure Laterality Date   ABDOMINAL HYSTERECTOMY  01/2021   CYSTOCELE REPAIR N/A 02/02/2021   Procedure: ANTERIOR REPAIR (CYSTOCELE), MODIFIED MCCALLS CULDOPLASTY;  Surgeon: Benjaman Kindler, MD;  Location: [facility];  Service: Gynecology;  Laterality: N/A;   CYSTOSCOPY N/A 02/02/2021   Procedure: CYSTOSCOPY;  Surgeon: Benjaman Kindler, MD;  Location: [facility];  Service: Gynecology;  Laterality: N/A;   LAPAROSCOPIC ASSISTED VAGINAL HYSTERECTOMY N/A 02/02/2021   Procedure: LAPAROSCOPIC ASSISTED VAGINAL HYSTERECTOMY;  Surgeon: Benjaman Kindler, MD;  Location: [facility];  Service: Gynecology;  Laterality: N/A;   LAPAROSCOPIC  BILATERAL SALPINGECTOMY Bilateral 02/02/2021   Procedure: LAPAROSCOPIC BILATERAL SALPINGECTOMY AND OOPHERECTOMY;  Surgeon: Benjaman Kindler, MD;  Location: [facility];  Service: Gynecology;  Laterality: Bilateral;   ROTATOR CUFF REPAIR Right    TUBAL LIGATION      There were no vitals filed for this visit.   Subjective Assessment - 02/01/22 0909     Subjective Pt fell after last visit and had to take time off. She went to her PCP for a wellness checkup but didn't have anyone look at her back but doctor was backed up and unable to see pt right away so she took a break from everything. She's been doing water aerobics twice a week but is sore afterwards. She has only walked once since last visit-last Tuesday. She does her stretches and feels as though she's getting better. Pt is taking a sea moss supplement. She wants to get back to walking and decr. back pain. Pt reported pelvic pain has been better. Pt is also able to complete water aerobics without having to get out of the pool to urinate (45 minutes). Pt reported back pain (thoracic and lumbar spine) 7/10 at worst, 0/10 at best. Voltaren gel has been helping back and knee pain. Pt did not have a bowel movement for 7 days and took magnesium and colace and had a bowel movement.    Pertinent History HTN,  hyperthyroidism, HLD, DM, Cx radiculitis, Lx radiculopathy, arthiritis, L rotator cuff rupture, OA, cystocele, eczema, cystocele repair and hysterectomy on 02/02/21    Patient Stated Goals "To be stronger (my core needs to be stronger).    Currently in Pain? No/denies                     Pelvic Floor Physical Therapy re- Evaluation and Assessment  Screenings: Red Flags: no Have you had any night sweats? Unexplained weight loss? Saddle anesthesia? Unexplained changes in bowel or bladder changes?   OBJECTIVE  Posture/Observations:  Sitting: feet not crossed Standing: incr. Tx spine kyphosis, B knees in flexion 2/2 decr. ROM but pt  does not wish to have TKAs as she's discussed this with her MD.  Palpation/Segmental Motion/Joint Play: hypomobility of T8-T12 and sacrum (R>L)    Range of Motion/Flexibilty:  Spine: limited Hips:limited   Strength/MMT:  LE MMT  B knee ext: 4/5 B knee flex: 4-/5 B ankle dorsiflexion: 4/5 LE MMT Left Right  Hip flex:  (L2) 4+/5 4+/5  Hip ext: /5 /5  Hip abd: 4-/5 4-/5  Hip add: 4/5 4/5  Hip IR /5 /5  Hip ER /5 /5     Abdominal:  Palpation: no TTP Diastasis: none noted.   Manual therapy: PT performed STM to R IT band, R hip add, and R gastroc/soleus to decr. Tension. B ankle DF limited.  NMR: Access Code: [email or web address] URL: [email or web address] Date: 02/01/2022 Prepared by: Geoffry Paradise  Exercises - Clamshell  - 1 x daily - 3 x weekly - 3 sets - 10 reps - 1-2 hold - Supine Diaphragmatic Breathing  - 3 x daily - 7 x weekly - 1 sets - 5 reps - 2 hold - Supine Hip Adductor Stretch  - 2-3 x daily - 7 x weekly - 1 sets - 3 reps - 30-60 hold - Supine Piriformis Stretch with Leg Straight  - 1 x daily - 7 x weekly - 1 sets - 3 reps - 30-60 hold - Supine Pelvic Floor Contraction  - 1 x daily - 7 x weekly - 1 sets - 10 reps incr. From 5 sec. To 10 sec. hold - Supine Pelvic Tilt  - 1 x daily - 7 x weekly - 1 sets - 10 reps - Happy Baby with Pelvic Floor Lengthening  - 1 x daily - 7 x weekly - 1 sets - 3 reps - 30-45 hold - Supine Lower Trunk Rotation  - 1 x daily - 7 x weekly - 1 sets - 3 reps - 30 hold - Beginner Bridge  - 1 x daily - 3 x weekly - 3 sets - 10 reps - Backward Walking with Counter Support  - 1 x daily - 7 x weekly - 1 sets - 4 reps - Hooklying Small March  - 1 x daily - 7 x weekly - 1 sets - 10 reps, ceased SLR with TrA 2/2 incr. Back pain.  Cues and demo for proper technique.  Patient Education - Assisted Squat-Pivot Bed Transfer To Armchair reviewed                Self care:  PT Education - 02/01/22 1019     Education  Details PT discussed renewal and recert process since pt not seen since 10/13/21 2/2 fall and work schedule. PT reviewed HEP and modified as indicated. PT reviewed proper toileting posture with pt.    Person(s) Educated Patient    Methods Explanation;Demonstration;Tactile cues;Verbal  cues;Handout    Comprehension Verbalized understanding;Returned demonstration;Need further instruction              PT Short Term Goals - 02/01/22 1027       PT SHORT TERM GOAL #1   Title Pt will be IND in HEP to improve strength, posture, and flexibility in order to improve functional mobility and pain. all unmet STGs will be carried over to new POC: 03/01/22    Baseline No HEP    Time 4    Period Weeks    Status Achieved    Target Date 07/28/21      PT SHORT TERM GOAL #2   Title Pt will be able to coordinate PFM to decr. urge incontinence and report nocturia 0-1/night to improve QOL.    Baseline 2-3x/week; 11/22: she feels sometimes better, sometimes worse as she is now mindful of it-approx. several a times week, nocturia 2-3 times per night    Time 4    Period Weeks    Status On-going    Target Date 07/28/21      PT SHORT TERM GOAL #3   Title Pt will demonstrate improve posture, alignment and hip strength surrounding pelvis to decr. pelvic pain during intercourse and job activities.    Baseline Incr. lx spine lordosis, decr. post. pelvic tilt and 3+/5 hip abd. (B), 11/22: Lx spine lordosis still present but somewhat improved, pelvic tilt improved and B hip abd: 4-/5    Time 4    Period Weeks    Status On-going               PT Long Term Goals - 02/01/22 1028       PT LONG TERM GOAL #1   Title Pt will complete FOTO and write goal as indicated. ALL UNMET LTGS WILL BE CARRIED OVER TO NEW POC: 03/29/22    Baseline no FOTO    Time 8    Period Weeks    Status Achieved      PT LONG TERM GOAL #2   Title Pt will demonstrate proper toileting posture, PFM coordination, and verbalize strategies  to incr. bowel frequency to every 3 days to decr. discomfort.    Baseline Once every week with medication, 09/21/21: every other day to every day.    Time 8    Period Weeks    Status Achieved      PT LONG TERM GOAL #3   Title Pt will improve pelvic floor muscle coordination and alignment in order to report 0 episodes of urge incontinence over the last four weeks.    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 in LBP and pelvic pain.    Baseline 09/21/21: pt reported not much pelvic pain but LBP is still present. Pt feels 50-60% better.    Time 10    Period Weeks    Status On-going      PT LONG TERM GOAL #5   Title Pt will improve FOTO score to >/=60 on urinary problems and >/=54 on bowel constipation sections to improve QOL.    Baseline 51: urinary promblem and 61: bowel constipation.  Time 10    Period Weeks    Status On-going      PT LONG TERM GOAL #6   Title Pt will amb. 1.5 miles with improved posture without reports of incr. back or pelvic pain, in order to exercise.    Time 4    Period Weeks    Status On-going    Target Date 10/19/21                   Plan - 02/01/22 1023     Clinical Impression Statement PT performed a re-eval today, as pt not seen for nearly 4 months 2/2 falling and pain. Pt continues to experience back and LE pain, decr. LE/core strength,hypomobility spine and sacrum, impaired posture (knees flexed in stance-unable to correct), decr. SLS balance, intermittent issues with bowel movements (constipation) and urinary urgency. All goals carried over to new POC in order to improve all deficits listed above to improves pt's pain and safety during ADLs and work.     Personal Factors and Comorbidities Comorbidity 3+;Age;Profession    Comorbidities HTN, hyperthyroidism, HLD, DM, Cx radiculitis, Lx radiculopathy, arthiritis, L rotator cuff rupture, OA, cystocele, eczema, cystocele repair and hysterectomy on 02/02/21    Examination-Activity Limitations Bend;Caring for Others;Carry;Continence;Locomotion Level;Lift    Examination-Participation Restrictions Cleaning;Laundry;Occupation;Meal Prep    PT Frequency Other (comment)   1x/week for 4 weeks and then every other week for 4 weeks-6 total   PT Duration Other (comment)    PT Treatment/Interventions ADLs/Self Care Home Management;Gait training;Stair training;Functional mobility training;Therapeutic activities;Neuromuscular re-education;Balance training;Therapeutic exercise;Patient/family education;Manual techniques;Joint Manipulations;Moist Heat;Cryotherapy    PT Next Visit Plan manual therapy, strength, balance    PT Home Exercise Plan [email or web address]    Consulted and Agree with Plan of Care Patient             Patient will benefit from skilled therapeutic intervention in order to improve the following deficits and impairments:  Abnormal gait, Decreased balance, Hypomobility, Pain, Postural dysfunction, Impaired flexibility, Decreased strength, Decreased range of motion, Decreased coordination  Visit Diagnosis: Muscle weakness (generalized) - Plan: PT plan of care cert/re-cert  Sacrococcygeal disorders, not elsewhere classified - Plan: PT plan of care cert/re-cert  Other abnormalities of gait and mobility - Plan: PT plan of care cert/re-cert  Mixed incontinence - Plan: PT plan of care cert/re-cert  Other lack of coordination - Plan: PT plan of care cert/re-cert     Problem List Patient Active Problem List   Diagnosis Date Noted   Snoring 09/25/2021   Psychophysiological insomnia 09/25/2021   Seasonal allergic rhinitis due to pollen 09/25/2021   Pelvic organ prolapse quantification stage 2 cystocele  02/02/2021   Statin medication declined by patient 07/28/2020   Lumbar radiculopathy 07/21/2020   T2DM (type 2 diabetes mellitus) ([place]) 01/03/2019   Hyperlipidemia associated with type 2 diabetes mellitus ([place]) 06/20/2018   Primary osteoarthritis of both knees 02/16/2017   Impingement syndrome of shoulder 07/02/2015   Complete rotator cuff rupture of left shoulder 07/02/2015   Cervical radiculitis 07/02/2015   Impingement syndrome of both shoulders 07/02/2015   Arthritis sicca 04/09/2015   Hypertension associated with diabetes ([place]) 04/09/2015   H/O eczema 04/09/2015   Hyperthyroidism 04/09/2015   Arthritis 04/09/2015    Sharae Zappulla L, PT 02/01/2022, 10:31 AM  [facility] [address] [place], [place], [address] Phone: [phone]   Fax:  [phone]  Name: Molly Potter MRN: [id number] Date of Birth: 1956-08-28  Geoffry Paradise, PT,DPT  02/01/22 10:35 AM Phone: [phone] Fax: [phone]

## 2022-02-01 NOTE — Patient Instructions (Signed)
Access Code: [email or web address] URL: [email or web address] Date: 02/01/2022 Prepared by: Geoffry Paradise  Exercises - Clamshell  - 1 x daily - 3 x weekly - 3 sets - 10 reps - 1-2 hold - Supine Diaphragmatic Breathing  - 3 x daily - 7 x weekly - 1 sets - 5 reps - 2 hold - Supine Hip Adductor Stretch  - 2-3 x daily - 7 x weekly - 1 sets - 3 reps - 30-60 hold - Supine Piriformis Stretch with Leg Straight  - 1 x daily - 7 x weekly - 1 sets - 3 reps - 30-60 hold - Supine Pelvic Floor Contraction  - 1 x daily - 7 x weekly - 1 sets - 10 reps - Supine Pelvic Tilt  - 1 x daily - 7 x weekly - 1 sets - 10 reps - Happy Baby with Pelvic Floor Lengthening  - 1 x daily - 7 x weekly - 1 sets - 3 reps - 30-45 hold - Supine Lower Trunk Rotation  - 1 x daily - 7 x weekly - 1 sets - 3 reps - 30 hold - Beginner Bridge  - 1 x daily - 3 x weekly - 3 sets - 10 reps - Backward Walking with Counter Support  - 1 x daily - 7 x weekly - 1 sets - 4 reps - Hooklying Small March  - 1 x daily - 7 x weekly - 1 sets - 10 reps  Patient Education - Assisted Squat-Pivot Bed Transfer To Armchair

## 2022-02-22 ENCOUNTER — Ambulatory Visit: Payer: PPO | Attending: Obstetrics and Gynecology

## 2022-02-22 ENCOUNTER — Other Ambulatory Visit: Payer: Self-pay

## 2022-02-22 DIAGNOSIS — R278 Other lack of coordination: Secondary | ICD-10-CM | POA: Diagnosis not present

## 2022-02-22 DIAGNOSIS — R2689 Other abnormalities of gait and mobility: Secondary | ICD-10-CM | POA: Insufficient documentation

## 2022-02-22 DIAGNOSIS — N3946 Mixed incontinence: Secondary | ICD-10-CM | POA: Insufficient documentation

## 2022-02-22 DIAGNOSIS — M6281 Muscle weakness (generalized): Secondary | ICD-10-CM | POA: Diagnosis not present

## 2022-02-22 DIAGNOSIS — M533 Sacrococcygeal disorders, not elsewhere classified: Secondary | ICD-10-CM | POA: Diagnosis not present

## 2022-02-22 NOTE — Therapy (Signed)
[place] MAIN [facility] SERVICES [address] [place], [place], [address] Phone: [phone]   Fax:  [phone]  Physical Therapy Treatment  Patient Details  Name: Molly Potter MRN: [id number] Date of Birth: [birth year] No data recorded  Encounter Date: 02/22/2022   PT End of Session - 02/22/22 1152     Visit Number 12    Number of Visits 16    Date for PT Re-Evaluation 04/02/22    Authorization Type BCBS Medicare: 30 PT/OT combined    Authorization Time Period 7/30 in 2022, not sure if visits start over in Jan?    Authorization - Visit Number 12    Authorization - Number of Visits 30    Progress Note Due on Visit 20    PT Start Time 0903    PT Stop Time 0956    PT Time Calculation (min) 53 min    Activity Tolerance Patient tolerated treatment well    Behavior During Therapy [facility] for tasks assessed/performed             Past Medical History:  Diagnosis Date   Arthritis    Diabetes mellitus without complication ([place])    diet controlled   Fatigue    GERD (gastroesophageal reflux disease)    History of eczema    Hypertension    Hyperthyroidism    Joint pain    PONV (postoperative nausea and vomiting)     Past Surgical History:  Procedure Laterality Date   ABDOMINAL HYSTERECTOMY  01/2021   CYSTOCELE REPAIR N/A 02/02/2021   Procedure: ANTERIOR REPAIR (CYSTOCELE), MODIFIED MCCALLS CULDOPLASTY;  Surgeon: Benjaman Kindler, MD;  Location: [facility] ORS;  Service: Gynecology;  Laterality: N/A;   CYSTOSCOPY N/A 02/02/2021   Procedure: CYSTOSCOPY;  Surgeon: Benjaman Kindler, MD;  Location: [facility] ORS;  Service: Gynecology;  Laterality: N/A;   LAPAROSCOPIC ASSISTED VAGINAL HYSTERECTOMY N/A 02/02/2021   Procedure: LAPAROSCOPIC ASSISTED VAGINAL HYSTERECTOMY;  Surgeon: Benjaman Kindler, MD;  Location: [facility] ORS;  Service: Gynecology;  Laterality: N/A;   LAPAROSCOPIC BILATERAL SALPINGECTOMY Bilateral 02/02/2021   Procedure: LAPAROSCOPIC  BILATERAL SALPINGECTOMY AND OOPHERECTOMY;  Surgeon: Benjaman Kindler, MD;  Location: [facility] ORS;  Service: Gynecology;  Laterality: Bilateral;   ROTATOR CUFF REPAIR Right    TUBAL LIGATION      There were no vitals filed for this visit.   Subjective Assessment - 02/22/22 0905     Subjective Pt reported she's had a scratchy throat the last few days but negative for COVID and been in a lot of pain (diffuse all over body). Pt reported doctor tried to prove she has fibromyalgia years ago but the pills didn't agree with her stomach. Pt reported pain at its worst: 10/10, salon patches, volaren gel all help along with medication. 0/10 at best when patient is seated and not moving.    Pertinent History HTN, hyperthyroidism, HLD, DM, Cx radiculitis, Lx radiculopathy, arthiritis, L rotator cuff rupture, OA, cystocele, eczema, cystocele repair and hysterectomy on 02/02/21    Patient Stated Goals "To be stronger (my core needs to be stronger).    Currently in Pain? No/denies    Pain Score 0-No pain   but incr. with movement                Self Care: Diaphragmatic breathing and guided meditation to decr. Tension prior to manual therapy. Pt in supine and reported incr. Relaxation afterwards.              [place] Adult PT Treatment/Exercise -  02/22/22 1140       Manual Therapy   Manual Therapy Soft tissue mobilization;Muscle Energy Technique    Manual therapy comments Pt noted to amb. with improved stride length, arm swing and upright posture after manual therapy. No pain noted.    Soft tissue mobilization PT performed STM and trigger point to B hip add. and diaphragm (with breathing).    Muscle Energy Technique Contract and relax of B hip add. and 3x10 sec. holds followed by prolonged stretches to decr. tension.           Pt performed supine angels after manual therapy. Incr. Time spent on manual therapy and self care to down regulate CNS today.        Self Care:  PT  Education - 02/22/22 1139     Education Details PT reiterated the importance of starting day and ending day with stretches and meditation to decr. tension throughout musculature. PT discussed focus of the next few weeks-strength (core, hips, LEs) to decr. pain and improve balance.    Person(s) Educated Patient    Methods Explanation    Comprehension Verbalized understanding              PT Short Term Goals - 02/01/22 1027       PT SHORT TERM GOAL #1   Title Pt will be IND in HEP to improve strength, posture, and flexibility in order to improve functional mobility and pain. all unmet STGs will be carried over to new POC: 03/01/22    Baseline No HEP    Time 4    Period Weeks    Status Achieved    Target Date 07/28/21      PT SHORT TERM GOAL #2   Title Pt will be able to coordinate PFM to decr. urge incontinence and report nocturia 0-1/night to improve QOL.    Baseline 2-3x/week; 11/22: she feels sometimes better, sometimes worse as she is now mindful of it-approx. several a times week, nocturia 2-3 times per night    Time 4    Period Weeks    Status On-going    Target Date 07/28/21      PT SHORT TERM GOAL #3   Title Pt will demonstrate improve posture, alignment and hip strength surrounding pelvis to decr. pelvic pain during intercourse and job activities.    Baseline Incr. lx spine lordosis, decr. post. pelvic tilt and 3+/5 hip abd. (B), 11/22: Lx spine lordosis still present but somewhat improved, pelvic tilt improved and B hip abd: 4-/5    Time 4    Period Weeks    Status On-going               PT Long Term Goals - 02/01/22 1028       PT LONG TERM GOAL #1   Title Pt will complete FOTO and write goal as indicated. ALL UNMET LTGS WILL BE CARRIED OVER TO NEW POC: 03/29/22    Baseline no FOTO    Time 8    Period Weeks    Status Achieved      PT LONG TERM GOAL #2   Title Pt will demonstrate proper toileting posture, PFM coordination, and verbalize strategies to  incr. bowel frequency to every 3 days to decr. discomfort.    Baseline Once every week with medication, 09/21/21: every other day to every day.    Time 8    Period Weeks    Status Achieved      PT LONG TERM GOAL #  3   Title Pt will improve pelvic floor muscle coordination and alignment in order to report 0 episodes of urge incontinence over the last four weeks.    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 in LBP and pelvic pain.    Baseline 09/21/21: pt reported not much pelvic pain but LBP is still present. Pt feels 50-60% better.    Time 10    Period Weeks    Status On-going      PT LONG TERM GOAL #5   Title Pt will improve FOTO score to >/=60 on urinary problems and >/=54 on bowel constipation sections to improve QOL.    Baseline 51: urinary promblem and 61: bowel constipation.    Time 10    Period Weeks    Status On-going      PT LONG TERM GOAL #6   Title Pt will amb. 1.5 miles with improved posture without reports of incr. back or pelvic pain, in order to exercise.    Time 4    Period Weeks    Status On-going    Target Date 10/19/21                   Plan - 02/22/22 1142     Clinical Impression Statement Today's skilled session focused on decr. tension by down regulating CNS with meditation, diaphragmatic breathing and manual therapy. Pt demonstrated progress as she denied pain with movement at end of session. Pt continues to experience incr. tension in diaphragm, B hips, and BLEs which can impact PFM tension. Pt would continue to benefit from skilled PT to improve deficits listed above to improve safety and reduce pain during all work activities and ADLs.    Comorbidities HTN,  hyperthyroidism, HLD, DM, Cx radiculitis, Lx radiculopathy, arthiritis, L rotator cuff rupture, OA, cystocele, eczema, cystocele repair and hysterectomy on 02/02/21    PT Treatment/Interventions ADLs/Self Care Home Management;Gait training;Stair training;Functional mobility training;Therapeutic activities;Neuromuscular re-education;Balance training;Therapeutic exercise;Patient/family education;Manual techniques;Joint Manipulations;Moist Heat;Cryotherapy    PT Next Visit Plan manual therapy, strength, balance    PT Home Exercise Plan [id number]    Consulted and Agree with Plan of Care Patient             Patient will benefit from skilled therapeutic intervention in order to improve the following deficits and impairments:  Abnormal gait, Decreased balance, Hypomobility, Pain, Postural dysfunction, Impaired flexibility, Decreased strength, Decreased range of motion, Decreased coordination  Visit Diagnosis: Muscle weakness (generalized)  Sacrococcygeal disorders, not elsewhere classified  Other abnormalities of gait and mobility  Mixed incontinence  Other lack of coordination     Problem List Patient Active Problem List   Diagnosis Date Noted   Snoring 09/25/2021   Psychophysiological insomnia 09/25/2021   Seasonal allergic rhinitis due to pollen 09/25/2021   Pelvic organ prolapse quantification stage 2 cystocele 02/02/2021   Statin medication declined by patient 07/28/2020   Lumbar radiculopathy 07/21/2020   T2DM (type 2 diabetes mellitus) ([place]) 01/03/2019   Hyperlipidemia associated with type 2 diabetes mellitus ([place]) 06/20/2018   Primary osteoarthritis of both knees 02/16/2017   Impingement syndrome of shoulder 07/02/2015   Complete rotator  cuff rupture of left shoulder 07/02/2015   Cervical radiculitis 07/02/2015   Impingement syndrome of both shoulders 07/02/2015   Arthritis sicca 04/09/2015   Hypertension associated with diabetes ([place]) 04/09/2015   H/O eczema 04/09/2015    Hyperthyroidism 04/09/2015   Arthritis 04/09/2015    Sonya Gunnoe L, PT 02/22/2022, 11:53 AM  [place] MAIN [facility] SERVICES [address] [place], [place], [address] Phone: [phone]   Fax:  [phone]  Name: Molly Potter MRN: [id number] Date of Birth: 12-12-1955  Geoffry Paradise, PT,DPT 02/22/22 11:53 AM Phone: [phone] Fax: [phone]

## 2022-02-24 ENCOUNTER — Ambulatory Visit: Payer: Self-pay | Admitting: *Deleted

## 2022-02-24 NOTE — Telephone Encounter (Signed)
Summary: cough, drainage, scratchy throat   Patient states she has been experiencing scratchy throat, sinus drainage x2w and a persistent dry cough x1d, patient has used cough drops and coricidin w/ no relief. Patient requesting a cb w/ otc recommendations      Reason for Disposition  [1] Continuous (nonstop) coughing interferes with work or school AND [2] no improvement using cough treatment per Care Advice  Answer Assessment - Initial Assessment Questions 1. ONSET: "When did the cough begin?"      Last night- allergies last week 2. SEVERITY: "How bad is the cough today?"      Coughing spells 3. SPUTUM: "Describe the color of your sputum" (none, dry cough; clear, white, yellow, green)     No sputum- clear nasal discharge 4. HEMOPTYSIS: "Are you coughing up any blood?" If so ask: "How much?" (flecks, streaks, tablespoons, etc.)     no 5. DIFFICULTY BREATHING: "Are you having difficulty breathing?" If Yes, ask: "How bad is it?" (e.g., mild, moderate, severe)    - MILD: No SOB at rest, mild SOB with walking, speaks normally in sentences, can lie down, no retractions, pulse < 100.    - MODERATE: SOB at rest, SOB with minimal exertion and prefers to sit, cannot lie down flat, speaks in phrases, mild retractions, audible wheezing, pulse 100-120.    - SEVERE: Very SOB at rest, speaks in single words, struggling to breathe, sitting hunched forward, retractions, pulse > 120      normal 6. FEVER: "Do you have a fever?" If Yes, ask: "What is your temperature, how was it measured, and when did it start?"     no 7. CARDIAC HISTORY: "Do you have any history of heart disease?" (e.g., heart attack, congestive heart failure)      no 8. LUNG HISTORY: "Do you have any history of lung disease?"  (e.g., pulmonary embolus, asthma, emphysema)     no 9. PE RISK FACTORS: "Do you have a history of blood clots?" (or: recent major surgery, recent prolonged travel, bedridden)     No 10. OTHER SYMPTOMS: "Do you have  any other symptoms?" (e.g., runny nose, wheezing, chest pain)       Runny nose, scratchy throat, slight headache 11. PREGNANCY: "Is there any chance you are pregnant?" "When was your last menstrual period?"       N/a 12. TRAVEL: "Have you traveled out of the country in the last month?" (e.g., travel history, exposures)       no  Protocols used: Cough - Acute Non-Productive-A-AH

## 2022-02-24 NOTE — Telephone Encounter (Signed)
  Chief Complaint: dry cough Symptoms: cough, throat irritation, nasal drainage Frequency: cough started yesterday-negative COVID Pertinent Negatives: Patient denies fever Disposition: '[]'$ ED /'[]'$ Urgent Care (no appt availability in office) / '[x]'$ Appointment(In office/virtual)/ '[]'$  [facility]/ '[]'$ Home Care/ '[]'$ Refused Recommended Disposition /'[]'$ [facility]/ '[]'$  Follow-up with PCP Additional Notes: Per patient request/schedule- appointment has been scheduled- outside disposition

## 2022-02-25 ENCOUNTER — Ambulatory Visit: Payer: PPO | Admitting: Family Medicine
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
## 2022-02-26 ENCOUNTER — Encounter: Payer: Self-pay | Admitting: Family Medicine

## 2022-02-26 ENCOUNTER — Telehealth: Payer: PPO | Admitting: Family Medicine

## 2022-02-26 ENCOUNTER — Ambulatory Visit (INDEPENDENT_AMBULATORY_CARE_PROVIDER_SITE_OTHER): Payer: PPO | Admitting: Family Medicine

## 2022-02-26 VITALS — BP 123/55 | HR 72 | Temp 98.2°F | Resp 16 | Ht 65.0 in | Wt 188.5 lb

## 2022-02-26 DIAGNOSIS — J301 Allergic rhinitis due to pollen: Secondary | ICD-10-CM | POA: Diagnosis not present

## 2022-02-26 DIAGNOSIS — J9801 Acute bronchospasm: Secondary | ICD-10-CM | POA: Diagnosis not present

## 2022-02-26 MED ORDER — TRELEGY ELLIPTA 100-62.5-25 MCG/ACT IN AEPB: 1.0000 | INHALATION_SPRAY | Freq: Every day | RESPIRATORY_TRACT | 0 refills | Status: DC

## 2022-02-26 MED ORDER — BENZONATATE 200 MG PO CAPS: 200.0000 mg | ORAL_CAPSULE | Freq: Three times a day (TID) | ORAL | 1 refills | Status: DC | PRN

## 2022-02-26 NOTE — Patient Instructions (Signed)
Continue to drink water daily.  Goal of 64 oz/day minimum.  Can use OTC allergy medication, ex Zyrtec as well as nasal steroid, ex Flonase.  If you are congested you can use Saline Nasal Spray.  Use saline prior to using Flonase if needed.  You can use medications for cough like Delsym and for mucus, like Mucinex.  Can add lozenges or drink tea with local honey to help relieve symptoms.  

## 2022-02-26 NOTE — Assessment & Plan Note (Signed)
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

## 2022-02-26 NOTE — Assessment & Plan Note (Signed)
Chronic, recent exacerbation Has been using Claritin 10 mg daily Has been using flonase daily, 2 sprays, each nare; encourage BID use Has been using Singulair qHS 10 mg

## 2022-03-03 ENCOUNTER — Ambulatory Visit: Payer: PPO

## 2022-03-03 ENCOUNTER — Other Ambulatory Visit: Payer: Self-pay

## 2022-03-03 DIAGNOSIS — M6281 Muscle weakness (generalized): Secondary | ICD-10-CM | POA: Diagnosis not present

## 2022-03-03 DIAGNOSIS — R2689 Other abnormalities of gait and mobility: Secondary | ICD-10-CM

## 2022-03-03 DIAGNOSIS — R278 Other lack of coordination: Secondary | ICD-10-CM

## 2022-03-03 DIAGNOSIS — N3946 Mixed incontinence: Secondary | ICD-10-CM

## 2022-03-03 DIAGNOSIS — M533 Sacrococcygeal disorders, not elsewhere classified: Secondary | ICD-10-CM

## 2022-03-03 NOTE — Therapy (Addendum)
OUTPATIENT PHYSICAL THERAPY TREATMENT NOTE   Patient Name: Molly Potter MRN: [id number] DOB:May 05, 1956, 66 y.o., female Today's Date: 03/03/2022  OBGYN: Kennith Maes   PT End of Session - 03/03/22 0906     Visit Number 13    Number of Visits 16    Date for PT Re-Evaluation 04/02/22    Authorization Type BCBS Medicare: 30 PT/OT combined    Progress Note Due on Visit 79    PT Start Time 0903    PT Stop Time 0956    PT Time Calculation (min) 53 min    Activity Tolerance Patient tolerated treatment well    Behavior During Therapy WFL for tasks assessed/performed             Past Medical History:  Diagnosis Date   Arthritis    Diabetes mellitus without complication ([place])    diet controlled   Fatigue    GERD (gastroesophageal reflux disease)    History of eczema    Hypertension    Hyperthyroidism    Joint pain    PONV (postoperative nausea and vomiting)    Past Surgical History:  Procedure Laterality Date   ABDOMINAL HYSTERECTOMY  01/2021   CYSTOCELE REPAIR N/A 02/02/2021   Procedure: ANTERIOR REPAIR (CYSTOCELE), MODIFIED MCCALLS CULDOPLASTY;  Surgeon: Benjaman Kindler, MD;  Location: [facility];  Service: Gynecology;  Laterality: N/A;   CYSTOSCOPY N/A 02/02/2021   Procedure: CYSTOSCOPY;  Surgeon: Benjaman Kindler, MD;  Location: [facility];  Service: Gynecology;  Laterality: N/A;   LAPAROSCOPIC ASSISTED VAGINAL HYSTERECTOMY N/A 02/02/2021   Procedure: LAPAROSCOPIC ASSISTED VAGINAL HYSTERECTOMY;  Surgeon: Benjaman Kindler, MD;  Location: [facility];  Service: Gynecology;  Laterality: N/A;   LAPAROSCOPIC BILATERAL SALPINGECTOMY Bilateral 02/02/2021   Procedure: LAPAROSCOPIC BILATERAL SALPINGECTOMY AND OOPHERECTOMY;  Surgeon: Benjaman Kindler, MD;  Location: [facility];  Service: Gynecology;  Laterality: Bilateral;   ROTATOR CUFF REPAIR Right    TUBAL LIGATION     Patient Active Problem List   Diagnosis Date Noted   Bronchospasm, acute 02/26/2022   Snoring  09/25/2021   Psychophysiological insomnia 09/25/2021   Seasonal allergic rhinitis due to pollen 09/25/2021   Pelvic organ prolapse quantification stage 2 cystocele 02/02/2021   Statin medication declined by patient 07/28/2020   Lumbar radiculopathy 07/21/2020   T2DM (type 2 diabetes mellitus) ([place]) 01/03/2019   Hyperlipidemia associated with type 2 diabetes mellitus ([place]) 06/20/2018   Primary osteoarthritis of both knees 02/16/2017   Impingement syndrome of shoulder 07/02/2015   Complete rotator cuff rupture of left shoulder 07/02/2015   Cervical radiculitis 07/02/2015   Impingement syndrome of both shoulders 07/02/2015   Arthritis sicca 04/09/2015   Hypertension associated with diabetes ([place]) 04/09/2015   H/O eczema 04/09/2015   Hyperthyroidism 04/09/2015   Arthritis 04/09/2015    REFERRING DIAG: cystocele  THERAPY DIAG:  Muscle weakness (generalized)  Sacrococcygeal disorders, not elsewhere classified  Other abnormalities of gait and mobility  Mixed incontinence  Other lack of coordination  Rationale for Evaluation and Treatment Rehabilitation  PERTINENT HISTORY: HTN, hyperthyroidism, HLD, DM, Cx radiculitis, Lx radiculopathy, arthiritis, L rotator cuff rupture, OA, cystocele, eczema, cystocele repair and hysterectomy on 02/02/21   PRECAUTIONS: balance issues  SUBJECTIVE: Pt reported she almost cancelled today because 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 Pt felt good overall with PT HEP prior to water aerobics. Pt denied pain with intercourse or leakage but still has tension in hips/PFM/LEs and pain and issues with balance. Pt went to [facility]  for lungs 2/2 irritation from smoke in the air last week and is on medication.   PAIN:  Are you having pain? Yes: NPRS scale: 5/10 Pain location: upper back/shoulders Pain description: achy Aggravating factors: movement Relieving factors: hot shower, medication-tylenol and salon pas patch     TODAY'S  TREATMENT:  NMR: Access Code: [email or web address] URL: [email or web address] Date: 03/03/2022 Prepared by: Geoffry Paradise  Exercises - Supine Diaphragmatic Breathing  - 3 x daily - 7 x weekly - 1 sets - 5 reps - 2 hold - Supine Pelvic Floor Contraction  - 1 x daily - 7 x weekly - 1 sets - 10 reps in stepping during SLS and ant. Stepping strategy. - standing Pelvic Tilt  - 1 x daily - 7 x weekly - 1 sets - 10 reps in standing to improve posture in standing.  - Backward Walking with Counter Support  - 1 x daily - 7 x weekly - 1 sets - 4 reps - Hooklying Small March  - 1 x daily - 7 x weekly - 1 sets - 10 reps - Side Stepping with Counter Support  - 1 x daily - 7 x weekly - 1 sets - 4 reps - Standing March with Counter Support  - 1 x daily - 7 x weekly - 1 sets - 4 reps -mini squats at counter x10 reps All performed with S and intermittent UE support at counter. S for safety. Performed with core engaged and PFM contraction/relaxation and breath coordination. Pt amb. 2 minutes for warm up prior to NMR.   PATIENT EDUCATION: Education details: Pt educated on new HEP and the importance of coordination of breathing and PFM contraction to engage core. Person educated: Patient Education method: Explanation, Demonstration, Verbal cues, and Handouts Education comprehension: verbalized understanding, returned demonstration, and needs further education   HOME EXERCISE PROGRAM: See above   PT Short Term Goals - 02/01/22 1027       PT SHORT TERM GOAL #1   Title Pt will be IND in HEP to improve strength, posture, and flexibility in order to improve functional mobility and pain. all unmet STGs will be carried over to new POC: 03/01/22    Baseline No HEP    Time 4    Period Weeks    Status Achieved    Target Date 07/28/21      PT SHORT TERM GOAL #2   Title Pt will be able to coordinate PFM to decr. urge incontinence and report nocturia 0-1/night to improve QOL.    Baseline 2-3x/week;  11/22: she feels sometimes better, sometimes worse as she is now mindful of it-approx. several a times week, nocturia 2-3 times per night    Time 4    Period Weeks    Status On-going    Target Date 07/28/21      PT SHORT TERM GOAL #3   Title Pt will demonstrate improve posture, alignment and hip strength surrounding pelvis to decr. pelvic pain during intercourse and job activities.    Baseline Incr. lx spine lordosis, decr. post. pelvic tilt and 3+/5 hip abd. (B), 11/22: Lx spine lordosis still present but somewhat improved, pelvic tilt improved and B hip abd: 4-/5    Time 4    Period Weeks    Status On-going              PT Long Term Goals - 02/01/22 1028       PT LONG TERM GOAL #1   Title Pt will complete FOTO  and write goal as indicated. ALL UNMET LTGS WILL BE CARRIED OVER TO NEW POC: 03/29/22    Baseline no FOTO    Time 8    Period Weeks    Status Achieved      PT LONG TERM GOAL #2   Title Pt will demonstrate proper toileting posture, PFM coordination, and verbalize strategies to incr. bowel frequency to every 3 days to decr. discomfort.    Baseline Once every week with medication, 09/21/21: every other day to every day.    Time 8    Period Weeks    Status Achieved      PT LONG TERM GOAL #3   Title Pt will improve pelvic floor muscle coordination and alignment in order to report 0 episodes of urge incontinence over the last four weeks.    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 in LBP and pelvic pain.    Baseline 09/21/21: pt reported not much pelvic pain but LBP is still present. Pt feels 50-60% better.    Time 10    Period Weeks    Status On-going      PT LONG TERM GOAL #5    Title Pt will improve FOTO score to >/=60 on urinary problems and >/=54 on bowel constipation sections to improve QOL.    Baseline 51: urinary promblem and 61: bowel constipation.    Time 10    Period Weeks    Status On-going      PT LONG TERM GOAL #6   Title Pt will amb. 1.5 miles with improved posture without reports of incr. back or pelvic pain, in order to exercise.    Time 4    Period Weeks    Status On-going    Target Date 10/19/21            Clinical Impression statement: Today's skilled session focused on improving balance, strength and coordination while performing a circuit. Pt demonstrated progress as she denied pain with movement at end of session. Pt progressed to requiring UE support at counter and progressed to no UE support during all balance activities. Pt would continue to benefit from skilled PT to improve deficits listed above to improve safety and reduce pain during all work activities and ADLs.    Plan: manual therapy prn, strength, balance (standing-SLS)  Woodson Macha L, PT 03/03/2022, 2:04 PM    Geoffry Paradise, PT,DPT 03/03/22 2:04 PM Phone: [phone] Fax: [phone]

## 2022-03-03 NOTE — Patient Instructions (Signed)
Access Code: [email or web address] URL: [email or web address] Date: 03/03/2022 Prepared by: Geoffry Paradise  Exercises - Clamshell  - 1 x daily - 3 x weekly - 3 sets - 10 reps - 1-2 hold - Supine Diaphragmatic Breathing  - 3 x daily - 7 x weekly - 1 sets - 5 reps - 2 hold - Supine Hip Adductor Stretch  - 2-3 x daily - 7 x weekly - 1 sets - 3 reps - 30-60 hold - Supine Piriformis Stretch with Leg Straight  - 1 x daily - 7 x weekly - 1 sets - 3 reps - 30-60 hold - Supine Pelvic Floor Contraction  - 1 x daily - 7 x weekly - 1 sets - 10 reps - Supine Pelvic Tilt  - 1 x daily - 7 x weekly - 1 sets - 10 reps - Happy Baby with Pelvic Floor Lengthening  - 1 x daily - 7 x weekly - 1 sets - 3 reps - 30-45 hold - Supine Lower Trunk Rotation  - 1 x daily - 7 x weekly - 1 sets - 3 reps - 30 hold - Beginner Bridge  - 1 x daily - 3 x weekly - 3 sets - 10 reps - Backward Walking with Counter Support  - 1 x daily - 7 x weekly - 1 sets - 4 reps - Hooklying Small March  - 1 x daily - 7 x weekly - 1 sets - 10 reps - Side Stepping with Counter Support  - 1 x daily - 7 x weekly - 1 sets - 4 reps - Standing March with Counter Support  - 1 x daily - 7 x weekly - 1 sets - 4 reps  Patient Education - Assisted Squat-Pivot Bed Transfer To Armchair

## 2022-03-10 ENCOUNTER — Other Ambulatory Visit: Payer: Self-pay

## 2022-03-10 ENCOUNTER — Ambulatory Visit: Payer: PPO

## 2022-03-10 DIAGNOSIS — R2689 Other abnormalities of gait and mobility: Secondary | ICD-10-CM

## 2022-03-10 DIAGNOSIS — M6281 Muscle weakness (generalized): Secondary | ICD-10-CM

## 2022-03-10 DIAGNOSIS — R278 Other lack of coordination: Secondary | ICD-10-CM

## 2022-03-10 DIAGNOSIS — N3946 Mixed incontinence: Secondary | ICD-10-CM

## 2022-03-10 DIAGNOSIS — M533 Sacrococcygeal disorders, not elsewhere classified: Secondary | ICD-10-CM

## 2022-03-10 NOTE — Therapy (Signed)
OUTPATIENT PHYSICAL THERAPY TREATMENT NOTE   Patient Name: Molly Potter MRN: [id number] DOB:1956-05-13, 66 y.o., female 54 Date: 03/10/2022  OBGYN: Kennith Maes   PT End of Session - 03/10/22 0909     Visit Number 14    Number of Visits 16    Date for PT Re-Evaluation 04/02/22    Authorization Type BCBS Medicare: 30 PT/OT combined    Authorization Time Period 7/30 in 2022, not sure if visits start over in Jan?    Authorization - Visit Number 14    Authorization - Number of Visits 30    Progress Note Due on Visit 20    PT Start Time 0907    PT Stop Time 1000    PT Time Calculation (min) 53 min    Activity Tolerance Patient tolerated treatment well    Behavior During Therapy WFL for tasks assessed/performed             Past Medical History:  Diagnosis Date   Arthritis    Diabetes mellitus without complication ([place])    diet controlled   Fatigue    GERD (gastroesophageal reflux disease)    History of eczema    Hypertension    Hyperthyroidism    Joint pain    PONV (postoperative nausea and vomiting)    Past Surgical History:  Procedure Laterality Date   ABDOMINAL HYSTERECTOMY  01/2021   CYSTOCELE REPAIR N/A 02/02/2021   Procedure: ANTERIOR REPAIR (CYSTOCELE), MODIFIED MCCALLS CULDOPLASTY;  Surgeon: Benjaman Kindler, MD;  Location: [facility];  Service: Gynecology;  Laterality: N/A;   CYSTOSCOPY N/A 02/02/2021   Procedure: CYSTOSCOPY;  Surgeon: Benjaman Kindler, MD;  Location: [facility];  Service: Gynecology;  Laterality: N/A;   LAPAROSCOPIC ASSISTED VAGINAL HYSTERECTOMY N/A 02/02/2021   Procedure: LAPAROSCOPIC ASSISTED VAGINAL HYSTERECTOMY;  Surgeon: Benjaman Kindler, MD;  Location: [facility];  Service: Gynecology;  Laterality: N/A;   LAPAROSCOPIC BILATERAL SALPINGECTOMY Bilateral 02/02/2021   Procedure: LAPAROSCOPIC BILATERAL SALPINGECTOMY AND OOPHERECTOMY;  Surgeon: Benjaman Kindler, MD;  Location: [facility];  Service: Gynecology;  Laterality: Bilateral;    ROTATOR CUFF REPAIR Right    TUBAL LIGATION     Patient Active Problem List   Diagnosis Date Noted   Bronchospasm, acute 02/26/2022   Snoring 09/25/2021   Psychophysiological insomnia 09/25/2021   Seasonal allergic rhinitis due to pollen 09/25/2021   Pelvic organ prolapse quantification stage 2 cystocele 02/02/2021   Statin medication declined by patient 07/28/2020   Lumbar radiculopathy 07/21/2020   T2DM (type 2 diabetes mellitus) ([place]) 01/03/2019   Hyperlipidemia associated with type 2 diabetes mellitus ([place]) 06/20/2018   Primary osteoarthritis of both knees 02/16/2017   Impingement syndrome of shoulder 07/02/2015   Complete rotator cuff rupture of left shoulder 07/02/2015   Cervical radiculitis 07/02/2015   Impingement syndrome of both shoulders 07/02/2015   Arthritis sicca 04/09/2015   Hypertension associated with diabetes ([place]) 04/09/2015   H/O eczema 04/09/2015   Hyperthyroidism 04/09/2015   Arthritis 04/09/2015    REFERRING DIAG: cystocele  THERAPY DIAG:  Muscle weakness (generalized)  Other lack of coordination  Other abnormalities of gait and mobility  Mixed incontinence  Sacrococcygeal disorders, not elsewhere classified  Rationale for Evaluation and Treatment Rehabilitation  PERTINENT HISTORY: HTN, hyperthyroidism, HLD, DM, Cx radiculitis, Lx radiculopathy, arthiritis, L rotator cuff rupture, OA, cystocele, eczema, cystocele repair and hysterectomy on 02/02/21   PRECAUTIONS: balance issues  SUBJECTIVE: Pt reported she is a little sore from water aerobics this morning but not as bad as last week. Pt  reported balance HEP is going fine. She was able to stand on one LE for longer periods of time at water aerobics last night.   PAIN:  Are you having pain? Yes: NPRS scale: 0/10     TODAY'S TREATMENT:  NMR: Access Code: [email or web address] URL: [email or web address] Date: 03/03/2022 Prepared by: Geoffry Paradise  Exercises: pt performed circuit  training today to improve endurance, strength, and balance to decr. PFM pain and safety. - Seated Diaphragmatic Breathing  - 3 x daily - 7 x weekly - 1 sets - 5 reps - 2 hold -Two bout of amb. 3 minutes and 2 minutes with cues to improve heel strike and neutral pelvis vs. Ant. Pelvic tilt. -SLS at counter 3x10sec./LE with intermittent UE support -Forward amb. At counter with eyes closed 8x4' -Mini squats with glute max squeeze x10 reps -grapevine 8x4' at counter with cues for weight shifting. -bridges x10 reps with glute squeeze at the top -hooklying hip abd. One LE at a time 3x 5 reps -lower trunk rotation side to side to decr. Back pain -hooklying and standing pelvic tilts x10 reps/position All performed with S and intermittent UE support at counter. S for safety.   PATIENT EDUCATION: Education details: Pt educated on new HEP and the importance of strength training and that some posture/gait deviations are impacted by decr. Ability to perform full B knee ext. Person educated: Patient Education method: Explanation, Demonstration, Verbal cues, and Handouts Education comprehension: verbalized understanding, returned demonstration, and needs further education   HOME EXERCISE PROGRAM: [email or web address]   PT Short Term Goals - 02/01/22 1027       PT SHORT TERM GOAL #1   Title Pt will be IND in HEP to improve strength, posture, and flexibility in order to improve functional mobility and pain. all unmet STGs will be carried over to new POC: 03/01/22    Baseline No HEP    Time 4    Period Weeks    Status Achieved    Target Date 07/28/21      PT SHORT TERM GOAL #2   Title Pt will be able to coordinate PFM to decr. urge incontinence and report nocturia 0-1/night to improve QOL.    Baseline 2-3x/week; 11/22: she feels sometimes better, sometimes worse as she is now mindful of it-approx. several a times week, nocturia 2-3 times per night    Time 4    Period Weeks    Status On-going     Target Date 07/28/21      PT SHORT TERM GOAL #3   Title Pt will demonstrate improve posture, alignment and hip strength surrounding pelvis to decr. pelvic pain during intercourse and job activities.    Baseline Incr. lx spine lordosis, decr. post. pelvic tilt and 3+/5 hip abd. (B), 11/22: Lx spine lordosis still present but somewhat improved, pelvic tilt improved and B hip abd: 4-/5    Time 4    Period Weeks    Status On-going              PT Long Term Goals - 02/01/22 1028       PT LONG TERM GOAL #1   Title Pt will complete FOTO and write goal as indicated. ALL UNMET LTGS WILL BE CARRIED OVER TO NEW POC: 03/29/22    Baseline no FOTO    Time 8    Period Weeks    Status Achieved      PT LONG TERM GOAL #2   Title Pt will demonstrate proper toileting posture,  PFM coordination, and verbalize strategies to incr. bowel frequency to every 3 days to decr. discomfort.    Baseline Once every week with medication, 09/21/21: every other day to every day.    Time 8    Period Weeks    Status Achieved      PT LONG TERM GOAL #3   Title Pt will improve pelvic floor muscle coordination and alignment in order to report 0 episodes of urge incontinence over the last four weeks.    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 in LBP and pelvic pain.    Baseline 09/21/21: pt reported not much pelvic pain but LBP is still present. Pt feels 50-60% better.    Time 10    Period Weeks    Status On-going      PT LONG TERM GOAL #5   Title Pt will improve FOTO score to >/=60 on urinary problems and >/=54 on bowel constipation sections to improve QOL.    Baseline 51: urinary promblem and 61: bowel constipation.    Time  10    Period Weeks    Status On-going      PT LONG TERM GOAL #6   Title Pt will amb. 1.5 miles with improved posture without reports of incr. back or pelvic pain, in order to exercise.    Time 4    Period Weeks    Status On-going    Target Date 10/19/21            Clinical Impression statement: Today's skilled session focused on improving balance, strength and coordination while performing a circuit to decr. PFM/hip/LE pain.  Pt progressed to requiring UE support at counter and progressed to no UE support during all balance activities. Endurance also improved as she could amb. Longer time periods without rest breaks. Pt would continue to benefit from skilled PT to improve deficits listed above to improve safety and reduce pain during all work activities and ADLs.    Plan: manual therapy prn, strength, balance (standing-SLS)  Tyla Burgner L, PT 03/10/2022, 9:10 AM    Geoffry Paradise, PT,DPT 03/10/22 9:10 AM Phone: [phone] Fax: [phone]

## 2022-03-18 LAB — HM DIABETES EYE EXAM

## 2022-03-22 NOTE — Progress Notes (Unsigned)
Established patient visit   Patient: Molly Potter   DOB: 1956-05-26   66 y.o. Female  MRN: [id number] Visit Date: 03/24/2022  Today's healthcare provider: Gwyneth Sprout, FNP  Introduced to nurse practitioner role and practice setting.  All questions answered.  Discussed provider/patient relationship and expectations.   I,Tiffany J Bragg,acting as a scribe for Gwyneth Sprout, FNP.,have documented all relevant documentation on the behalf of Gwyneth Sprout, FNP,as directed by  Gwyneth Sprout, FNP while in the presence of Gwyneth Sprout, FNP.   Chief Complaint  Patient presents with   Diabetes   Hyperlipidemia   Hypertension   Subjective    HPI  Diabetes Mellitus Type II, follow-up  Lab Results  Component Value Date   HGBA1C 9.3 (A) 03/24/2022   HGBA1C 9.0 (A) 12/24/2021   HGBA1C 7.9 (A) 09/25/2021   Last seen for diabetes 3 months ago.  Management since then includes continuing the same treatment. She reports excellent compliance with treatment. She is not having side effects.   Home blood sugar records:  not checked  Episodes of hypoglycemia? No    Most Recent Eye Exam: 03/22/22  --------------------------------------------------------------------------------------------------- Hypertension, follow-up  BP Readings from Last 3 Encounters:  03/24/22 (!) 130/56  02/26/22 (!) 123/55  12/24/21 113/77   Wt Readings from Last 3 Encounters:  03/24/22 186 lb (84.4 kg)  02/26/22 188 lb 8 oz (85.5 kg)  12/24/21 187 lb 14.4 oz (85.2 kg)     She was last seen for hypertension 3 months ago.  BP at that visit was 113/77. Management since that visit includes continue medication. She reports excellent compliance with treatment. She is not having side effects.  She is exercising. She is adherent to low salt diet.   Outside blood pressures are around 124/64  She does not smoke.  Use of agents associated with hypertension: none.    --------------------------------------------------------------------------------------------------- Lipid/Cholesterol, follow-up  Last Lipid Panel: Lab Results  Component Value Date   CHOL 142 07/20/2021   LDLCALC 72 07/20/2021   HDL 46 07/20/2021   TRIG 137 07/20/2021    She was last seen for this 3 months ago.  Management since that visit includes start losartan 164m, and rosuvastatin 569m   She reports excellent compliance with treatment. She is not having side effects.   Symptoms: No appetite changes No foot ulcerations  No chest pain No chest pressure/discomfort  No dyspnea No orthopnea  Yes fatigue No lower extremity edema  No palpitations No paroxysmal nocturnal dyspnea  No nausea Yes numbness or tingling of extremity  Yes polydipsia No polyuria  No speech difficulty No syncope   She is following a Regular diet. Current exercise: aerobics and swimming  Last metabolic panel Lab Results  Component Value Date   GLUCOSE 173 (H) 07/20/2021   NA 141 07/20/2021   K 4.5 07/20/2021   BUN 10 07/20/2021   CREATININE 0.81 07/20/2021   EGFR 81 07/20/2021   GFRNONAA >60 02/03/2021   CALCIUM 9.6 07/20/2021   AST 13 07/20/2021   ALT 12 07/20/2021   The 10-year ASCVD risk score (Arnett DK, et al., 2019) is: 16.5%  ---------------------------------------------------------------------------------------------------   Medications: Outpatient Medications Prior to Visit  Medication Sig   acetaminophen (TYLENOL) 500 MG tablet Take 1,000 mg by mouth every 6 (six) hours as needed for moderate pain.   benzonatate (TESSALON) 200 MG capsule Take 1 capsule (200 mg total) by mouth 3 (three) times daily as needed for cough.  cholecalciferol (VITAMIN D3) 25 MCG (1000 UNIT) tablet Take 1,000 Units by mouth daily.   cyclobenzaprine (FLEXERIL) 5 MG tablet Take 1 tablet (5 mg total) by mouth at bedtime.   diclofenac Sodium (VOLTAREN) 1 % GEL Apply 2 g topically daily as needed (pain).    diltiazem (CARDIZEM CD) 240 MG 24 hr capsule Take 1 capsule (240 mg total) by mouth daily.   docusate sodium (COLACE) 100 MG capsule Take 1 capsule (100 mg total) by mouth 2 (two) times daily. To keep stools soft   Fluticasone-Umeclidin-Vilant (TRELEGY ELLIPTA) 100-62.5-25 MCG/ACT AEPB Inhale 1 puff into the lungs daily.   glucose blood (CONTOUR NEXT TEST) test strip To check BG BID   loratadine (CLARITIN) 10 MG tablet Take 10 mg by mouth daily as needed for allergies.   losartan (COZAAR) 100 MG tablet Take 1 tablet (100 mg total) by mouth daily.   Microlet Lancets MISC To check BG BID   montelukast (SINGULAIR) 10 MG tablet Take 1 tablet (10 mg total) by mouth at bedtime.   Multiple Vitamin (MULTIVITAMIN) tablet Take 1 tablet by mouth daily.   Polyethyl Glycol-Propyl Glycol (SYSTANE OP) Place 1 drop into both eyes daily as needed (dry/irritated eyes).   rosuvastatin (CRESTOR) 5 MG tablet Take 1 tablet (5 mg total) by mouth daily.   temazepam (RESTORIL) 7.5 MG capsule Take 1 capsule (7.5 mg total) by mouth at bedtime as needed for sleep.   traZODone (DESYREL) 50 MG tablet Take 0.5-1 tablets (25-50 mg total) by mouth at bedtime as needed for sleep.   No facility-administered medications prior to visit.    Review of Systems  Last CBC Lab Results  Component Value Date   WBC 9.0 07/20/2021   HGB 13.7 07/20/2021   HCT 39.6 07/20/2021   MCV 92 07/20/2021   MCH 31.8 07/20/2021   RDW 11.5 (L) 07/20/2021   PLT 399 80/11/4915   Last metabolic panel Lab Results  Component Value Date   GLUCOSE 173 (H) 07/20/2021   NA 141 07/20/2021   K 4.5 07/20/2021   CL 104 07/20/2021   CO2 24 07/20/2021   BUN 10 07/20/2021   CREATININE 0.81 07/20/2021   EGFR 81 07/20/2021   CALCIUM 9.6 07/20/2021   PHOS 3.9 05/28/2015   PROT 7.0 07/20/2021   ALBUMIN 4.6 07/20/2021   LABGLOB 2.4 07/20/2021   AGRATIO 1.9 07/20/2021   BILITOT 0.3 07/20/2021   ALKPHOS 100 07/20/2021   AST 13 07/20/2021   ALT 12  07/20/2021   ANIONGAP 6 02/03/2021   Last lipids Lab Results  Component Value Date   CHOL 142 07/20/2021   HDL 46 07/20/2021   LDLCALC 72 07/20/2021   TRIG 137 07/20/2021   CHOLHDL 3.1 07/20/2021   Last hemoglobin A1c Lab Results  Component Value Date   HGBA1C 9.3 (A) 03/24/2022   Last thyroid functions Lab Results  Component Value Date   TSH 1.940 07/20/2021       Objective    BP (!) 130/56 (BP Location: Right Arm, Patient Position: Sitting, Cuff Size: Normal)   Pulse 76   Temp 98 F (36.7 C) (Oral)   Resp 16   Ht 5' 5" (1.651 m)   Wt 186 lb (84.4 kg)   SpO2 97%   BMI 30.95 kg/m  BP Readings from Last 3 Encounters:  03/24/22 (!) 130/56  02/26/22 (!) 123/55  12/24/21 113/77   Wt Readings from Last 3 Encounters:  03/24/22 186 lb (84.4 kg)  02/26/22 188 lb 8  oz (85.5 kg)  12/24/21 187 lb 14.4 oz (85.2 kg)   SpO2 Readings from Last 3 Encounters:  03/24/22 97%  02/26/22 98%  09/25/21 95%      Physical Exam Vitals and nursing note reviewed.  Constitutional:      General: She is awake. She is not in acute distress.    Appearance: Normal appearance. She is well-developed and well-groomed. She is obese. She is not ill-appearing, toxic-appearing or diaphoretic.  HENT:     Head: Normocephalic and atraumatic.     Jaw: There is normal jaw occlusion. No trismus, tenderness, swelling or pain on movement.     Right Ear: Hearing, tympanic membrane, ear canal and external ear normal. There is no impacted cerumen.     Left Ear: Hearing, tympanic membrane, ear canal and external ear normal. There is no impacted cerumen.     Nose: Nose normal. No congestion or rhinorrhea.     Right Turbinates: Not enlarged, swollen or pale.     Left Turbinates: Not enlarged, swollen or pale.     Right Sinus: No maxillary sinus tenderness or frontal sinus tenderness.     Left Sinus: No maxillary sinus tenderness or frontal sinus tenderness.     Mouth/Throat:     Lips: Pink.     Mouth:  Mucous membranes are moist. No injury.     Tongue: No lesions.     Pharynx: Oropharynx is clear. Uvula midline. No pharyngeal swelling, oropharyngeal exudate, posterior oropharyngeal erythema or uvula swelling.     Tonsils: No tonsillar exudate or tonsillar abscesses.  Eyes:     General: Lids are normal. Lids are everted, no foreign bodies appreciated. Vision grossly intact. Gaze aligned appropriately. No allergic shiner or visual field deficit.       Right eye: No discharge.        Left eye: No discharge.     Extraocular Movements: Extraocular movements intact.     Conjunctiva/sclera: Conjunctivae normal.     Right eye: Right conjunctiva is not injected. No exudate.    Left eye: Left conjunctiva is not injected. No exudate.    Pupils: Pupils are equal, round, and reactive to light.  Neck:     Thyroid: No thyroid mass, thyromegaly or thyroid tenderness.     Vascular: No carotid bruit.     Trachea: Trachea normal.  Cardiovascular:     Rate and Rhythm: Normal rate and regular rhythm.     Pulses: Normal pulses.          Carotid pulses are 2+ on the right side and 2+ on the left side.      Radial pulses are 2+ on the right side and 2+ on the left side.       Dorsalis pedis pulses are 2+ on the right side and 2+ on the left side.       Posterior tibial pulses are 2+ on the right side and 2+ on the left side.     Heart sounds: Normal heart sounds, S1 normal and S2 normal. No murmur heard.    No friction rub. No gallop.  Pulmonary:     Effort: Pulmonary effort is normal. No respiratory distress.     Breath sounds: Normal breath sounds and air entry. No stridor. No wheezing, rhonchi or rales.  Chest:     Chest wall: No tenderness.     Comments: Breasts: risk and benefit of breast self-exam was discussed, not examined  Abdominal:     General: Abdomen is  flat. Bowel sounds are normal. There is no distension.     Palpations: Abdomen is soft. There is no mass.     Tenderness: There is no  abdominal tenderness. There is no right CVA tenderness, left CVA tenderness, guarding or rebound.     Hernia: No hernia is present.  Genitourinary:    Comments: Exam deferred; denies complaints Musculoskeletal:        General: No swelling, tenderness, deformity or signs of injury. Normal range of motion.     Cervical back: Full passive range of motion without pain, normal range of motion and neck supple. No edema, rigidity or tenderness. No muscular tenderness.     Right lower leg: No edema.     Left lower leg: No edema.  Lymphadenopathy:     Cervical: No cervical adenopathy.     Right cervical: No superficial, deep or posterior cervical adenopathy.    Left cervical: No superficial, deep or posterior cervical adenopathy.  Skin:    General: Skin is warm and dry.     Capillary Refill: Capillary refill takes less than 2 seconds.     Coloration: Skin is not jaundiced or pale.     Findings: No bruising, erythema, lesion or rash.  Neurological:     General: No focal deficit present.     Mental Status: She is alert and oriented to person, place, and time. Mental status is at baseline.     GCS: GCS eye subscore is 4. GCS verbal subscore is 5. GCS motor subscore is 6.     Sensory: Sensation is intact. No sensory deficit.     Motor: Motor function is intact. No weakness.     Coordination: Coordination is intact. Coordination normal.     Gait: Gait is intact. Gait normal.  Psychiatric:        Attention and Perception: Attention and perception normal.        Mood and Affect: Mood and affect normal.        Speech: Speech normal.        Behavior: Behavior normal. Behavior is cooperative.        Thought Content: Thought content normal.        Cognition and Memory: Cognition and memory normal.        Judgment: Judgment normal.       Results for orders placed or performed in visit on 03/24/22  POCT glycosylated hemoglobin (Hb A1C)  Result Value Ref Range   Hemoglobin A1C 9.3 (A) 4.0 - 5.6 %    Est. average glucose Bld gHb Est-mCnc 220   HM DIABETES EYE EXAM  Result Value Ref Range   HM Diabetic Eye Exam No Retinopathy No Retinopathy    Assessment & Plan     Problem List Items Addressed This Visit       Cardiovascular and Mediastinum   Hypertension associated with diabetes ([place])    Chronic, stable; slight decreased noted in DBP; however, patient denies complaints  Denies CP Denies SOB/ DOE Denies low blood pressure/hypotension Denies vision changes No LE Edema noted on exam Continue medication, Dilt 240 mg Losartan 141m,  Denies side effects Seek emergent care if you develop chest pain or chest pressure       Relevant Medications   glipiZIDE (GLUCOTROL XL) 10 MG 24 hr tablet     Endocrine   Hyper[place] type 2 diabetes mellitus [place]    Chronic, previously stable LDL last at 72 Recommend repeat at 11/23  Relevant Medications   glipiZIDE (GLUCOTROL XL) 10 MG 24 hr tablet   T2DM (type 2 diabetes mellitus) ([place])    Chronic, increased Patient reports more dietary indiscretions Patient reports more work related stressors Patient remains hesitant about starting on medication Continue to recommend balanced, lower carb meals. Smaller meal size, adding snacks. Choosing water as drink of choice and increasing purposeful exercise. Recommend daily glipizide 42m with 3 month f/u; recommend f/u with CCM pharmd for cost benefit analysis       Relevant Medications   glipiZIDE (GLUCOTROL XL) 10 MG 24 hr tablet   Other Relevant Orders   POCT glycosylated hemoglobin (Hb A1C) (Completed)   AMB Referral to [facility]     Nervous and Auditory   Cervical radiculitis    Chronic, stable Continues to use PT and water aerobics to assist       Lumbar radiculopathy    Chronic, stable Continues to use PT and water aerobics to assist        Musculoskeletal and Integument   Primary osteoarthritis of both knees    Chronic,  stable Continues to use PT and water aerobics to assist         Other   Annual physical exam - Primary   Psychophysiological insomnia    Chronic, stable Reports very busy lifestyle and dislike of evening work hours from 4-6pm due to having to go back out of the home; however, 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      IVonna Kotyk FNP, have reviewed all documentation for this visit. The documentation on 03/24/22 for the exam, diagnosis, procedures, and orders are all accurate and complete.    EGwyneth Sprout FSt. [phone](phone) [phone](fax)  [place]

## 2022-03-24 ENCOUNTER — Ambulatory Visit (INDEPENDENT_AMBULATORY_CARE_PROVIDER_SITE_OTHER): Payer: PPO | Admitting: Family Medicine

## 2022-03-24 ENCOUNTER — Encounter: Payer: Self-pay | Admitting: Family Medicine

## 2022-03-24 ENCOUNTER — Ambulatory Visit: Payer: PPO | Attending: Obstetrics and Gynecology

## 2022-03-24 VITALS — BP 130/56 | HR 76 | Temp 98.0°F | Resp 16 | Ht 65.0 in | Wt 186.0 lb

## 2022-03-24 DIAGNOSIS — R278 Other lack of coordination: Secondary | ICD-10-CM | POA: Insufficient documentation

## 2022-03-24 DIAGNOSIS — E1159 Type 2 diabetes mellitus with other circulatory complications: Secondary | ICD-10-CM | POA: Diagnosis not present

## 2022-03-24 DIAGNOSIS — Z0000 Encounter for general adult medical examination without abnormal findings: Secondary | ICD-10-CM

## 2022-03-24 DIAGNOSIS — N3946 Mixed incontinence: Secondary | ICD-10-CM | POA: Insufficient documentation

## 2022-03-24 DIAGNOSIS — I152 Hypertension secondary to endocrine disorders: Secondary | ICD-10-CM

## 2022-03-24 DIAGNOSIS — M5416 Radiculopathy, lumbar region: Secondary | ICD-10-CM

## 2022-03-24 DIAGNOSIS — E1169 Type 2 diabetes mellitus with other specified complication: Secondary | ICD-10-CM

## 2022-03-24 DIAGNOSIS — M533 Sacrococcygeal disorders, not elsewhere classified: Secondary | ICD-10-CM | POA: Insufficient documentation

## 2022-03-24 DIAGNOSIS — E785 Hyperlipidemia, unspecified: Secondary | ICD-10-CM

## 2022-03-24 DIAGNOSIS — M6281 Muscle weakness (generalized): Secondary | ICD-10-CM | POA: Insufficient documentation

## 2022-03-24 DIAGNOSIS — M5412 Radiculopathy, cervical region: Secondary | ICD-10-CM

## 2022-03-24 DIAGNOSIS — R2689 Other abnormalities of gait and mobility: Secondary | ICD-10-CM | POA: Insufficient documentation

## 2022-03-24 DIAGNOSIS — F5104 Psychophysiologic insomnia: Secondary | ICD-10-CM

## 2022-03-24 DIAGNOSIS — M170 Bilateral primary osteoarthritis of knee: Secondary | ICD-10-CM | POA: Diagnosis not present

## 2022-03-24 LAB — POCT GLYCOSYLATED HEMOGLOBIN (HGB A1C)
Est. average glucose Bld gHb Est-mCnc: 220
Hemoglobin A1C: 9.3 % — AB (ref 4.0–5.6)

## 2022-03-24 MED ORDER — GLIPIZIDE ER 10 MG PO TB24: 10.0000 mg | ORAL_TABLET | Freq: Every day | ORAL | 1 refills | Status: DC

## 2022-03-24 NOTE — Assessment & Plan Note (Signed)
Chronic, stable Continues to use PT and water aerobics to assist

## 2022-03-24 NOTE — Assessment & Plan Note (Signed)
Chronic, stable; slight decreased noted in DBP; however, patient denies complaints  Denies CP Denies SOB/ DOE Denies low blood pressure/hypotension Denies vision changes No LE Edema noted on exam Continue medication, Dilt 240 mg Losartan '100mg'$ ,  Denies side effects Seek emergent care if you develop chest pain or chest pressure

## 2022-03-24 NOTE — Assessment & Plan Note (Signed)
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

## 2022-03-24 NOTE — Assessment & Plan Note (Signed)
Chronic, previously stable LDL last at 72 Recommend repeat at 11/23

## 2022-03-24 NOTE — Assessment & Plan Note (Signed)
Chronic, increased Patient reports more dietary indiscretions Patient reports more work related stressors Patient remains hesitant about starting on medication Continue to recommend balanced, lower carb meals. Smaller meal size, adding snacks. Choosing water as drink of choice and increasing purposeful exercise. Recommend daily glipizide '10mg'$  with 3 month f/u; recommend f/u with CCM pharmd for cost benefit analysis

## 2022-03-31 ENCOUNTER — Ambulatory Visit: Payer: PPO

## 2022-03-31 ENCOUNTER — Other Ambulatory Visit: Payer: Self-pay

## 2022-03-31 DIAGNOSIS — N3946 Mixed incontinence: Secondary | ICD-10-CM

## 2022-03-31 DIAGNOSIS — M533 Sacrococcygeal disorders, not elsewhere classified: Secondary | ICD-10-CM | POA: Diagnosis not present

## 2022-03-31 DIAGNOSIS — R2689 Other abnormalities of gait and mobility: Secondary | ICD-10-CM

## 2022-03-31 DIAGNOSIS — M6281 Muscle weakness (generalized): Secondary | ICD-10-CM | POA: Diagnosis not present

## 2022-03-31 DIAGNOSIS — R278 Other lack of coordination: Secondary | ICD-10-CM | POA: Diagnosis not present

## 2022-03-31 NOTE — Therapy (Signed)
OUTPATIENT PHYSICAL THERAPY TREATMENT NOTE   Patient Name: Molly Potter MRN: [id number] DOB:Jul 29, 1956, 66 y.o., female Today's Date: 03/31/2022  OBGYN: Kennith Maes   PT End of Session - 03/31/22 0929     Visit Number 15    Number of Visits 16    Date for PT Re-Evaluation 04/02/22    Authorization Type BCBS Medicare: 3 PT/OT combined    Authorization Time Period 7/30 in 2022, not sure if visits start over in Jan?    Progress Note Due on Visit 20    PT Start Time 0930    PT Stop Time 1010    PT Time Calculation (min) 40 min    Activity Tolerance Patient tolerated treatment well    Behavior During Therapy WFL for tasks assessed/performed             Past Medical History:  Diagnosis Date   Arthritis    Diabetes mellitus without complication ([place])    diet controlled   Fatigue    GERD (gastroesophageal reflux disease)    History of eczema    Hypertension    Hyperthyroidism    Joint pain    PONV (postoperative nausea and vomiting)    Past Surgical History:  Procedure Laterality Date   ABDOMINAL HYSTERECTOMY  01/2021   CYSTOCELE REPAIR N/A 02/02/2021   Procedure: ANTERIOR REPAIR (CYSTOCELE), MODIFIED MCCALLS CULDOPLASTY;  Surgeon: Benjaman Kindler, MD;  Location: [facility];  Service: Gynecology;  Laterality: N/A;   CYSTOSCOPY N/A 02/02/2021   Procedure: CYSTOSCOPY;  Surgeon: Benjaman Kindler, MD;  Location: [facility];  Service: Gynecology;  Laterality: N/A;   LAPAROSCOPIC ASSISTED VAGINAL HYSTERECTOMY N/A 02/02/2021   Procedure: LAPAROSCOPIC ASSISTED VAGINAL HYSTERECTOMY;  Surgeon: Benjaman Kindler, MD;  Location: [facility];  Service: Gynecology;  Laterality: N/A;   LAPAROSCOPIC BILATERAL SALPINGECTOMY Bilateral 02/02/2021   Procedure: LAPAROSCOPIC BILATERAL SALPINGECTOMY AND OOPHERECTOMY;  Surgeon: Benjaman Kindler, MD;  Location: [facility];  Service: Gynecology;  Laterality: Bilateral;   ROTATOR CUFF REPAIR Right    TUBAL LIGATION     Patient Active Problem  List   Diagnosis Date Noted   Annual physical exam 03/24/2022   Bronchospasm, acute 02/26/2022   Snoring 09/25/2021   Psychophysiological insomnia 09/25/2021   Seasonal allergic rhinitis due to pollen 09/25/2021   Pelvic organ prolapse quantification stage 2 cystocele 02/02/2021   Lumbar radiculopathy 07/21/2020   T2DM (type 2 diabetes mellitus) ([place]) 01/03/2019   Hyperlipidemia associated with type 2 diabetes mellitus ([place]) 06/20/2018   Primary osteoarthritis of both knees 02/16/2017   Impingement syndrome of shoulder 07/02/2015   Complete rotator cuff rupture of left shoulder 07/02/2015   Cervical radiculitis 07/02/2015   Impingement syndrome of both shoulders 07/02/2015   Arthritis sicca 04/09/2015   Hypertension associated with diabetes ([place]) 04/09/2015   H/O eczema 04/09/2015   Hyperthyroidism 04/09/2015   Arthritis 04/09/2015    REFERRING DIAG: cystocele  THERAPY DIAG:  Muscle weakness (generalized)  Other lack of coordination  Other abnormalities of gait and mobility  Mixed incontinence  Sacrococcygeal disorders, not elsewhere classified  Rationale for Evaluation and Treatment Rehabilitation  PERTINENT HISTORY: HTN, hyperthyroidism, HLD, DM, Cx radiculitis, Lx radiculopathy, arthiritis, L rotator cuff rupture, OA, cystocele, eczema, cystocele repair and hysterectomy on 02/02/21   PRECAUTIONS: balance issues  SUBJECTIVE: Pt reported she has been experiencing incr. Back and R hip flexor pain when performing mini squats and lower trunk rotation. Pain began last week but denied any changes. Pt has not been to aquatic therapy 2/2 pain.  PAIN:  Are you having pain? Yes: NPRS scale: 2/10 Low back, achy and eased with walking and voltaren gel. Exercises make pain worse.      TODAY'S TREATMENT:  NMR: Access Code: [email or web address] URL: [email or web address] Date: 03/03/2022 Prepared by: Geoffry Paradise  Exercises: reviewed HEP and had pt cease grapevine at  this time 2/2 pain and decr. Squat depth. -SLS at counter 3x10sec./LE with intermittent UE support reviewed. -Forward amb. At counter with eyes closed 8x4' reviewed. -Mini squats with glute max squeeze x10 reps with improved posture and abdominal engagment. -Pelvic tilts to readjust posture during standing and gait.  All performed with S and intermittent UE support at counter. S for safety.   Manual therapy:  ~STM to pt's R proximal quad to decr. Tension and pain. No pain noted after hip STM. Then amb. 100' with back pain incr. 10/10. ~PT assessed R lower back/SI joint with decr. Mobility, placed rolled washcloth // sacrum/tailbone on R side and pain reduce to 2/10. Pt then amb. 100' with pt reporting pain decr. To 5/10 in low back and 0/10 in RLE.  PATIENT EDUCATION: Education details: Pt educated on ceasing HEP for a few days and perform aquatics class instead to decr. Pain. PT educated pt on using washcloth for SI/back back-in pt instructions. and the importance of strength training and that some posture/gait deviations are impacted by decr. Ability to perform full B knee ext. Person educated: Patient Education method: Explanation, Demonstration, Verbal cues, and Handouts Education comprehension: verbalized understanding, returned demonstration, and needs further education   HOME EXERCISE PROGRAM: [email or web address]   PT Short Term Goals - 02/01/22 1027       PT SHORT TERM GOAL #1   Title Pt will be IND in HEP to improve strength, posture, and flexibility in order to improve functional mobility and pain. all unmet STGs will be carried over to new POC: 03/01/22    Baseline No HEP    Time 4    Period Weeks    Status Achieved    Target Date 07/28/21      PT SHORT TERM GOAL #2   Title Pt will be able to coordinate PFM to decr. urge incontinence and report nocturia 0-1/night to improve QOL.    Baseline 2-3x/week; 11/22: she feels sometimes better, sometimes worse as she is now mindful  of it-approx. several a times week, nocturia 2-3 times per night    Time 4    Period Weeks    Status On-going    Target Date 07/28/21      PT SHORT TERM GOAL #3   Title Pt will demonstrate improve posture, alignment and hip strength surrounding pelvis to decr. pelvic pain during intercourse and job activities.    Baseline Incr. lx spine lordosis, decr. post. pelvic tilt and 3+/5 hip abd. (B), 11/22: Lx spine lordosis still present but somewhat improved, pelvic tilt improved and B hip abd: 4-/5    Time 4    Period Weeks    Status On-going              PT Long Term Goals - 02/01/22 1028       PT LONG TERM GOAL #1   Title Pt will complete FOTO and write goal as indicated. ALL UNMET LTGS WILL BE CARRIED OVER TO NEW POC: 03/29/22    Baseline no FOTO    Time 8    Period Weeks    Status Achieved      PT LONG TERM GOAL #2  Title Pt will demonstrate proper toileting posture, PFM coordination, and verbalize strategies to incr. bowel frequency to every 3 days to decr. discomfort.    Baseline Once every week with medication, 09/21/21: every other day to every day.    Time 8    Period Weeks    Status Achieved      PT LONG TERM GOAL #3   Title Pt will improve pelvic floor muscle coordination and alignment in order to report 0 episodes of urge incontinence over the last four weeks.    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 in LBP and pelvic pain.    Baseline 09/21/21: pt reported not much pelvic pain but LBP is still present. Pt feels 50-60% better.    Time 10    Period Weeks    Status On-going      PT LONG TERM GOAL #5   Title Pt will improve FOTO score to >/=60 on urinary problems and >/=54  on bowel constipation sections to improve QOL.    Baseline 51: urinary promblem and 61: bowel constipation.    Time 10    Period Weeks    Status On-going      PT LONG TERM GOAL #6   Title Pt will amb. 1.5 miles with improved posture without reports of incr. back or pelvic pain, in order to exercise.    Time 4    Period Weeks    Status On-going    Target Date 10/19/21            Clinical Impression statement: Today's skilled session focused on decr. Pain in order to perform ADLs and job tasks. Pt continues to experience postural impairments (B knee flexion in stance, ant. Pelvic tilt). Pt demonstrated progress during session as she reported decr. Pain after manual therapy. Pt continues to experience decr. Flexibility, strength and mobility. LTGs postponed two weeks 2/2 pt missing appt's 2/2 PCP appt and PT's schedule.  Pt would continue to benefit from skilled PT to improve deficits listed above to improve safety and reduce pain during all work activities and ADLs.    Plan: manual therapy prn, strength, balance (standing-SLS)  Ansley Stanwood L, PT 03/31/2022, 9:30 AM    Geoffry Paradise, PT,DPT 03/31/22 9:30 AM Phone: [phone] Fax: [phone]

## 2022-03-31 NOTE — Patient Instructions (Signed)
Place rolled washcloth next to tailbone/sacrum on right side and lie down for 5 minutes, every day for one week and the twice a week for the second week.

## 2022-04-05 ENCOUNTER — Telehealth: Payer: Self-pay | Admitting: Pharmacist

## 2022-04-05 NOTE — Progress Notes (Unsigned)
[facility]) Care Management  [place]   04/05/2022  Molly Potter [birth year] [id number]  Reason for referral: Medication Assistance  Referral source: Kerin Salen, LPN Referral medication(s): Diltiazem ER Current insurance:HTA  HPI:  Type 2 diabetes, hyperlipidemia, osteoarthritis of the knees, back pain, and seasonal allergies.   Objective: Allergies  Allergen Reactions   Effexor [Venlafaxine] Anaphylaxis   Hylan G-F 20 Other (See Comments)    Myalgias, hand pain, back pain; Benadryl helped pain Other reaction(s): Other Myalgias, hand pain, back pain; Benadryl helped pain Burning sensation all over body   Diazepam     Other reaction(s): Unknown   Tramadol Nausea Only   Methylprednisolone Nausea Only and Other (See Comments)    Headaches and chest pain    Medications Reviewed Today     Reviewed by Elza Rafter, PT (Physical Therapist) on 03/31/22 at [place]  Med List Status: <None>   Medication Order Taking? Sig Documenting Provider Last Dose Status Informant  acetaminophen (TYLENOL) 500 MG tablet [id number] No Take 1,000 mg by mouth every 6 (six) hours as needed for moderate pain. [provider] Taking Active Self  benzonatate (TESSALON) 200 MG capsule [id number] No Take 1 capsule (200 mg total) by mouth 3 (three) times daily as needed for cough. Gwyneth Sprout, FNP Taking Active   cholecalciferol (VITAMIN D3) 25 MCG (1000 UNIT) tablet [id number] No Take 1,000 Units by mouth daily. [provider] Taking Active Self  cyclobenzaprine (FLEXERIL) 5 MG tablet [id number] No Take 1 tablet (5 mg total) by mouth at bedtime. Gwyneth Sprout, FNP Taking Active   diclofenac Sodium (VOLTAREN) 1 % GEL [id number] No Apply 2 g topically daily as needed (pain). [provider] Taking Active Self  diltiazem (CARDIZEM CD) 240 MG 24 hr capsule [id number] No Take 1 capsule (240 mg total) by mouth daily. Gwyneth Sprout, FNP Taking Active    docusate sodium (COLACE) 100 MG capsule [id number] No Take 1 capsule (100 mg total) by mouth 2 (two) times daily. To keep stools soft Benjaman Kindler, MD Taking Active   Fluticasone-Umeclidin-Vilant (TRELEGY ELLIPTA) 100-62.5-25 MCG/ACT AEPB [id number] No Inhale 1 puff into the lungs daily. Gwyneth Sprout, FNP Taking Active   glipiZIDE (GLUCOTROL XL) 10 MG 24 hr tablet [id number]  Take 1 tablet (10 mg total) by mouth daily with lunch. Gwyneth Sprout, FNP  Active   glucose blood (CONTOUR NEXT TEST) test strip [id number] No To check BG BID Mar Daring, PA-C Taking Active Self  loratadine (CLARITIN) 10 MG tablet [id number] No Take 10 mg by mouth daily as needed for allergies. [provider] Taking Active Self  losartan (COZAAR) 100 MG tablet [id number] No Take 1 tablet (100 mg total) by mouth daily. Gwyneth Sprout, FNP Taking Active   Microlet Lancets MISC [id number] No To check BG BID Mar Daring, PA-C Taking Active Self  montelukast (SINGULAIR) 10 MG tablet [id number] No Take 1 tablet (10 mg total) by mouth at bedtime. Gwyneth Sprout, FNP Taking Active   Multiple Vitamin (MULTIVITAMIN) tablet [id number] No Take 1 tablet by mouth daily. [provider] Taking Active Self  Polyethyl Glycol-Propyl Glycol (SYSTANE OP) [id number] No Place 1 drop into both eyes daily as needed (dry/irritated eyes). [provider] Taking Active Self  rosuvastatin (CRESTOR) 5 MG tablet [id number] No Take 1 tablet (5 mg total) by mouth daily. Gwyneth Sprout, FNP Taking Active   temazepam (RESTORIL) 7.5 MG capsule [id number] No Take 1  capsule (7.5 mg total) by mouth at bedtime as needed for sleep. Gwyneth Sprout, FNP Taking Active   traZODone (DESYREL) 50 MG tablet [id number] No Take 0.5-1 tablets (25-50 mg total) by mouth at bedtime as needed for sleep. Gwyneth Sprout, FNP Taking Active             Assessment:  Medication Review Findings:  Adherence-Patient reported not taking  Glipizide. Her last HgA1c was 9.3.  We spoke at length about the importance of taking her medications as prescribed.  She expressed some concern about glipizide and said she was afraid to take it.  Side effects of glipizide were reviewed along with the signs and symptoms of hyper and hypoglycemia. Patient was also not checking her blood sugar.  We reviewed how to use her Contour Next meter over the phone. Patient was able to check her blood sugar during our conversation and it was 152.  We discussed blood sugar ranges pre and post meals, fasting, and at bedtime.  Medication Assistance Findings:  Medication assistance needs identified: patient requested help affording Diltiazem CD 240 mg.  She reported paying $30 for a 90 day supply.  Unfortunately, there is not a program for diltiazem and the $30 copay is the copay for tier 2 medications through traditional pharmacy or mail order on [facility].     Additional medication assistance options reviewed with patient as warranted:  Mail order programs, Visual merchandiser  Plan: Route note to provider. Follow up with patient in 2 weeks to be sure she is taking her medications and checking her blood sugars.  Elayne Guerin, PharmD, [place] Clinical Pharmacist [phone]

## 2022-04-07 ENCOUNTER — Ambulatory Visit: Payer: PPO

## 2022-04-07 ENCOUNTER — Other Ambulatory Visit: Payer: Self-pay

## 2022-04-13 ENCOUNTER — Telehealth: Payer: Self-pay | Admitting: Family Medicine

## 2022-04-13 DIAGNOSIS — E1122 Type 2 diabetes mellitus with diabetic chronic kidney disease: Secondary | ICD-10-CM

## 2022-04-13 NOTE — Telephone Encounter (Signed)
Medication Refill - Medication: glucose blood (CONTOUR NEXT TEST) test   Has the patient contacted their pharmacy? No.   Preferred Pharmacy (with phone number or street name):  [place], [place] Phone:  [phone]  Fax:  [phone]     Has the patient been seen for an appointment in the last year OR does the patient have an upcoming appointment? Yes.

## 2022-04-14 MED ORDER — CONTOUR NEXT TEST VI STRP: ORAL_STRIP | 3 refills | Status: AC

## 2022-04-14 NOTE — Telephone Encounter (Signed)
Requested medication (s) are due for refill today:   Yes  Requested medication (s) are on the active medication list:   Yes from 2020  Future visit scheduled:   Yes   Last ordered: 01/16/2019 #200, 3 refills  Returned because a new rx is needed   Requested Prescriptions  Pending Prescriptions Disp Refills   glucose blood (CONTOUR NEXT TEST) test strip 200 each 3    Sig: To check BG BID     Endocrinology: Diabetes - Testing Supplies Passed - 04/13/2022 10:47 AM      Passed - Valid encounter within last 12 months    Recent Outpatient Visits           3 weeks ago Annual physical exam   [facility] Gwyneth Sprout, FNP   1 month ago Bronchospasm, acute   [facility] [name] T, FNP   3 months ago Type 2 diabetes mellitus with other specified complication, without long-term current use of insulin [facility])   [facility] [name] T, FNP   6 months ago Type 2 diabetes mellitus with other specified complication, without long-term current use of insulin [facility])   [facility] Gwyneth Sprout, FNP   8 months ago Welcome to [employer] preventive visit   [facility], Dionne Bucy, MD       Future Appointments             In 2 months Gwyneth Sprout, [place], [place]

## 2022-04-20 DIAGNOSIS — M5442 Lumbago with sciatica, left side: Secondary | ICD-10-CM | POA: Diagnosis not present

## 2022-04-20 DIAGNOSIS — M5441 Lumbago with sciatica, right side: Secondary | ICD-10-CM | POA: Diagnosis not present

## 2022-04-20 DIAGNOSIS — G8929 Other chronic pain: Secondary | ICD-10-CM | POA: Diagnosis not present

## 2022-04-21 NOTE — Telephone Encounter (Signed)
Patient spoke to the pharmacy and they needed more information regarding the strips. She talked to someone at the pharmacy and they told her the insurance company states more information is needed from the provider. Please assist patient further

## 2022-04-22 MED ORDER — BLOOD GLUCOSE METER KIT: PACK | 0 refills | Status: AC

## 2022-04-22 NOTE — Addendum Note (Signed)
Addended by: Shawna Orleans on: 04/22/2022 09:59 AM   Modules accepted: Orders

## 2022-04-22 NOTE — Telephone Encounter (Signed)
Beatrice reports patient insurance will cover one touch ultra or freestyle. New prescription sent for one touch ultra.

## 2022-05-11 DIAGNOSIS — G8929 Other chronic pain: Secondary | ICD-10-CM | POA: Diagnosis not present

## 2022-05-11 DIAGNOSIS — M5442 Lumbago with sciatica, left side: Secondary | ICD-10-CM | POA: Diagnosis not present

## 2022-05-11 DIAGNOSIS — M5441 Lumbago with sciatica, right side: Secondary | ICD-10-CM | POA: Diagnosis not present

## 2022-05-12 ENCOUNTER — Encounter: Payer: Self-pay | Admitting: Family Medicine

## 2022-05-12 ENCOUNTER — Other Ambulatory Visit: Payer: Self-pay | Admitting: Family Medicine

## 2022-05-12 DIAGNOSIS — K5903 Drug induced constipation: Secondary | ICD-10-CM

## 2022-05-12 MED ORDER — LACTULOSE 10 GM/15ML PO SOLN: 10.0000 g | Freq: Two times a day (BID) | ORAL | 0 refills | Status: AC | PRN

## 2022-05-12 MED ORDER — METFORMIN HCL ER 500 MG PO TB24: 1000.0000 mg | ORAL_TABLET | Freq: Two times a day (BID) | ORAL | 1 refills | Status: DC

## 2022-05-14 ENCOUNTER — Other Ambulatory Visit: Payer: Self-pay | Admitting: Family Medicine

## 2022-05-14 DIAGNOSIS — E1122 Type 2 diabetes mellitus with diabetic chronic kidney disease: Secondary | ICD-10-CM

## 2022-05-14 DIAGNOSIS — N1831 Chronic kidney disease, stage 3a: Secondary | ICD-10-CM

## 2022-05-15 ENCOUNTER — Other Ambulatory Visit: Payer: Self-pay | Admitting: Family Medicine

## 2022-05-15 DIAGNOSIS — E1122 Type 2 diabetes mellitus with diabetic chronic kidney disease: Secondary | ICD-10-CM

## 2022-05-20 ENCOUNTER — Telehealth: Payer: Self-pay | Admitting: *Deleted

## 2022-05-20 NOTE — Chronic Care Management (AMB) (Signed)
  Chronic Care Management   Note  05/20/2022 Name: Molly Potter MRN: [id number] DOB: [birth year]  Molly Potter is a 66 y.o. year old female who is a primary care patient of Gwyneth Sprout, FNP. I reached out to Anthon by phone today in response to a referral sent by [name]'s PCP.  [name] was given information about Chronic Care Management services today including:  CCM service includes personalized support from designated clinical staff supervised by her physician, including individualized plan of care and coordination with other care providers 24/7 contact phone numbers for assistance for urgent and routine care needs. Service will only be billed when office clinical staff spend 20 minutes or more in a month to coordinate care. Only one practitioner may furnish and bill the service in a calendar month. The patient may stop CCM services at any time (effective at the end of the month) by phone call to the office staff. The patient is responsible for co-pay (up to 20% after annual deductible is met) if co-pay is required by the individual health plan.   Patient agreed to services and verbal consent obtained.   Follow up plan: Telephone appointment with care management team member scheduled for: 06/21/2022  Julian Hy, [place] Direct Dial: [phone]

## 2022-05-25 IMAGING — MG MM DIGITAL SCREENING BILAT W/ TOMO AND CAD
8 series · 8 of 24 positions shown · non-contrast
Comparison: Previous exam(s).

CLINICAL DATA: Screening.

EXAM:
DIGITAL SCREENING BILATERAL MAMMOGRAM WITH TOMOSYNTHESIS AND CAD
TECHNIQUE: Bilateral screening digital craniocaudal and mediolateral oblique
mammograms were obtained. Bilateral screening digital breast
tomosynthesis was performed. The images were evaluated with
computer-aided detection.

[R MLO synth-2D]
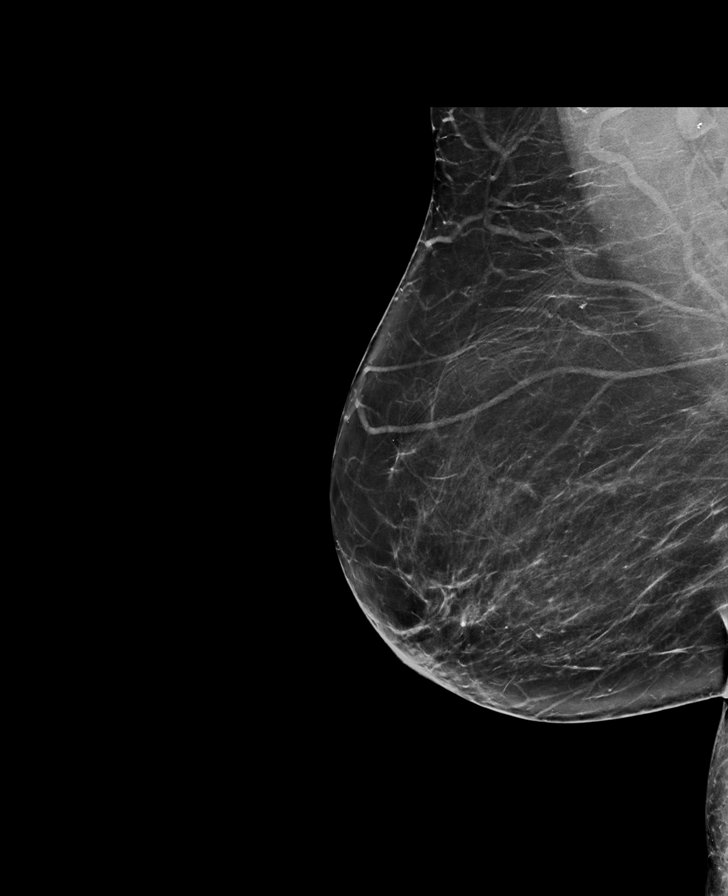

[L MLO synth-2D]
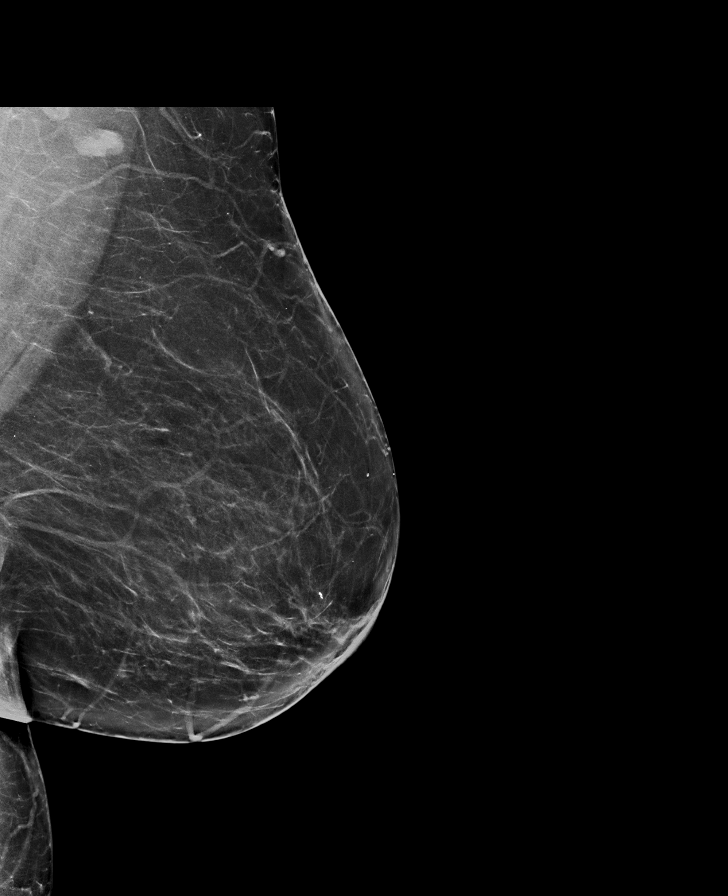

[L CC synth-2D]
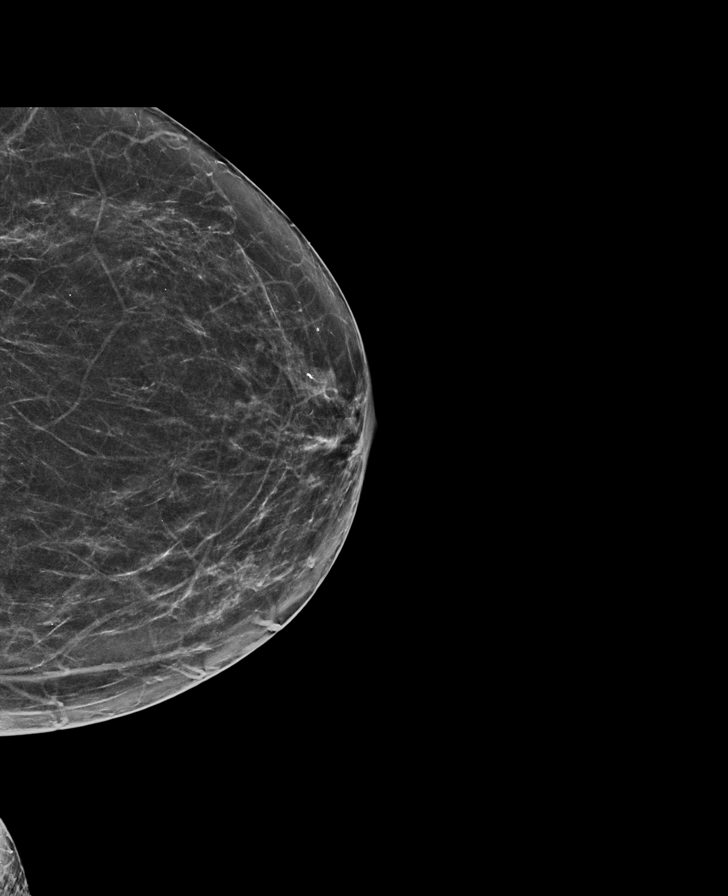

[R CC synth-2D]
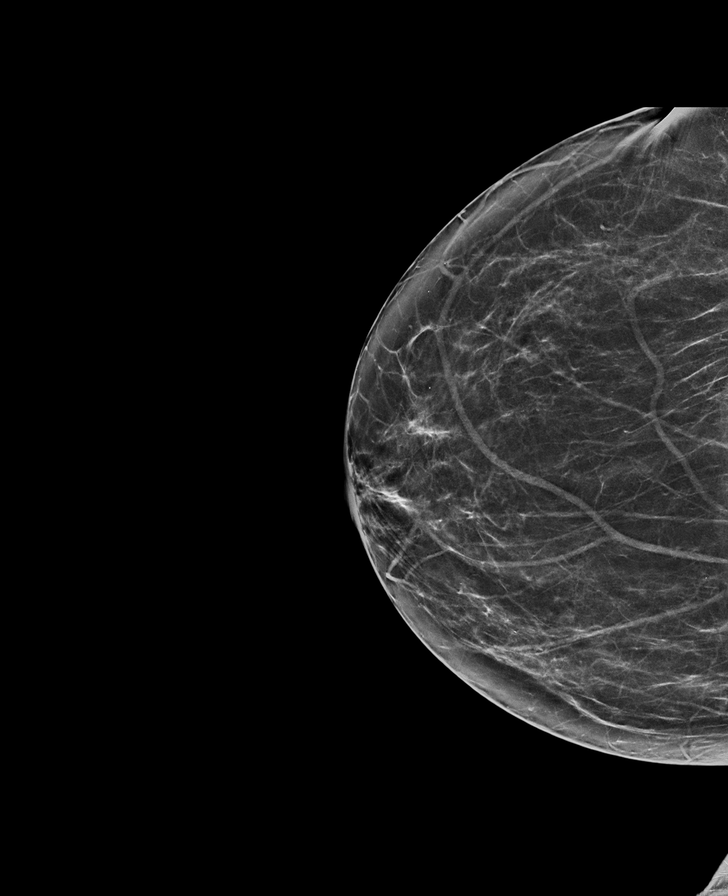

[L MLO tomo · tomo slice 39/77.0]
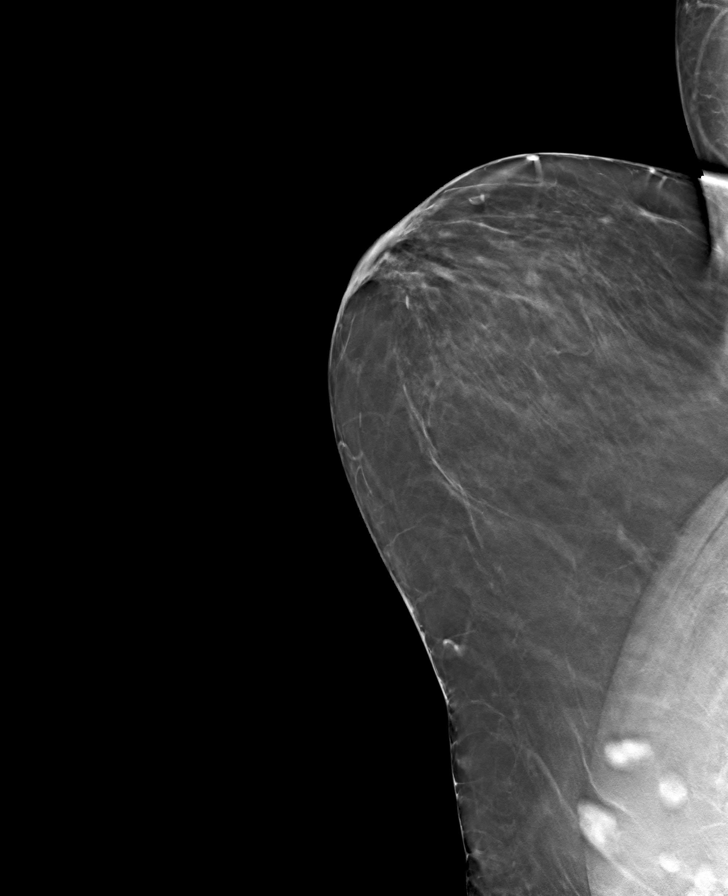

[R MLO tomo · tomo slice 39/78.0]
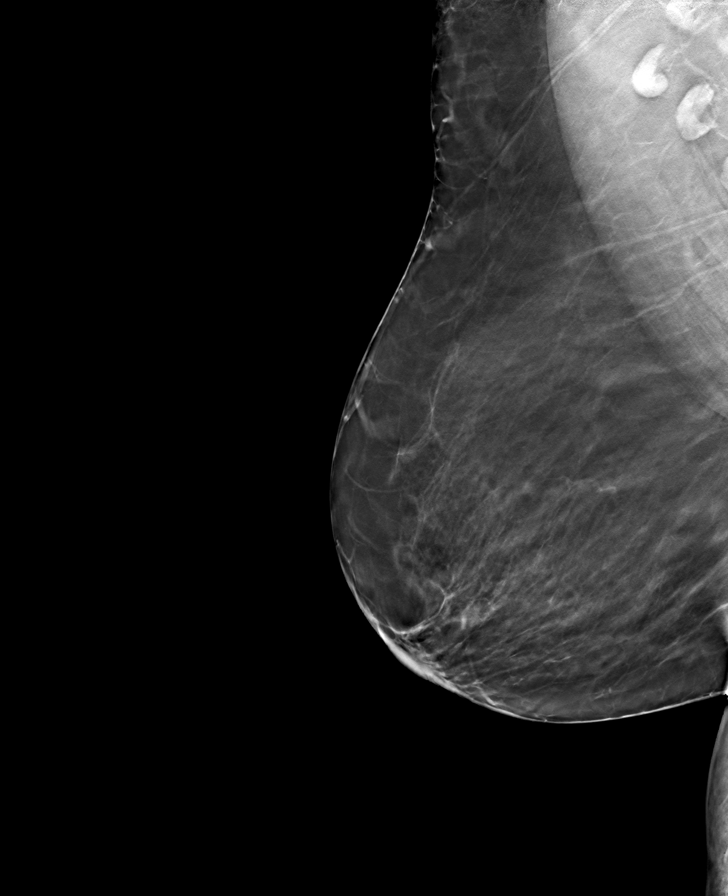

[R CC tomo · tomo slice 33/65.0]
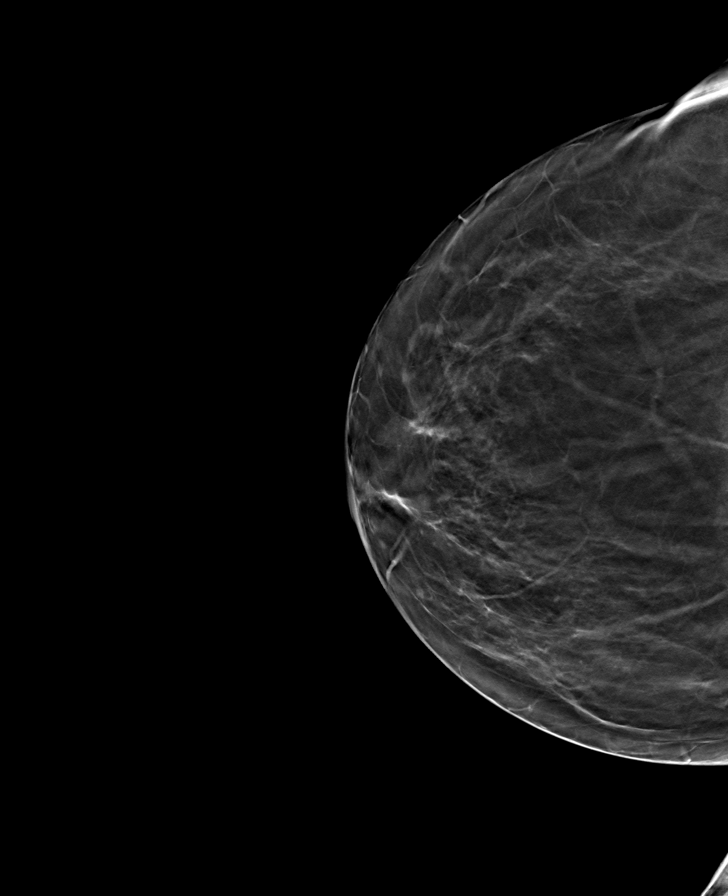

[L CC tomo · tomo slice 32/63.0]
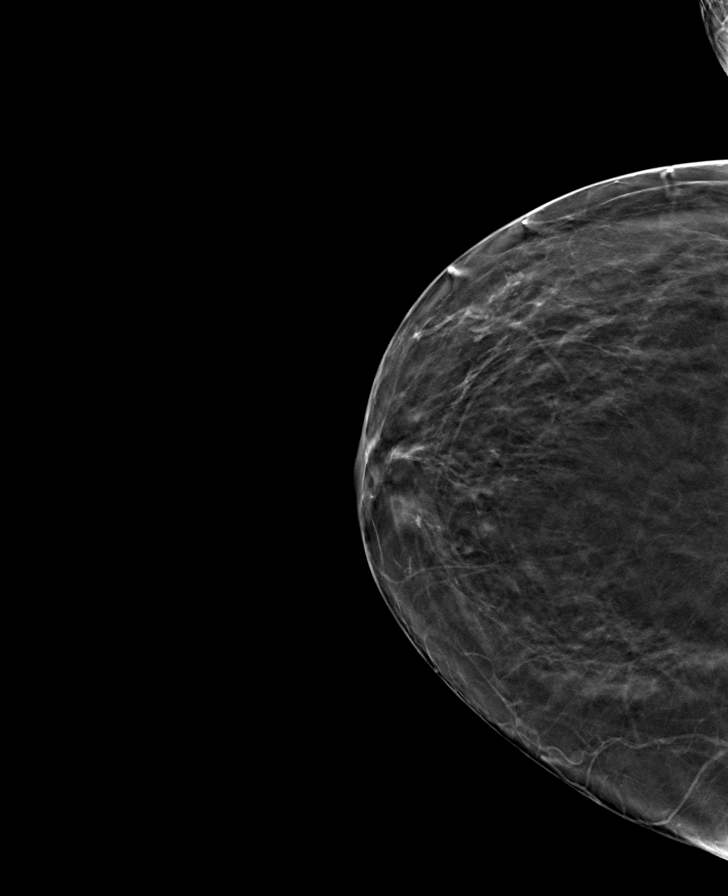

[8 of 24 positions shown; findings below may reference images not displayed]

ACR Breast Density Category b: There are scattered areas of
fibroglandular density.
FINDINGS: There are no findings suspicious for malignancy.
IMPRESSION: No mammographic evidence of malignancy. A result letter of this
screening mammogram will be mailed directly to the patient.

RECOMMENDATION:
Screening mammogram in one year. (Code:51-O-LD2)

BI-RADS CATEGORY  1: Negative.

## 2022-06-18 ENCOUNTER — Telehealth: Payer: Self-pay

## 2022-06-18 NOTE — Progress Notes (Signed)
Chronic Care Management Pharmacy Assistant   Name: Molly Potter  MRN: [id number] DOB: 08-Sep-1956  Reason for Encounter: Initial Assessment for telephone visit with Molly Potter, CPP on 06/21/2022 @ 1300   Conditions to be addressed/monitored: HTN, HLD, DMII, Allergic Rhinitis, Osteoarthritis, and Hyperthyroidism, Cervical Radiculitis, Lumbar radiculopathy, Arthritis Sicca, Impingement Syndrome of both shoulders,   Primary concerns for visit include: Elevated A1C   Recent office visits:  03/24/2022 Molly Potter, Molly Potter (PCP Office Visit) for Annual Physical Exam- Started: Glipizide 10 mg daily with lunch, Lab orders placed, DM Eye Exam order placed, Patient to follow-up in 3 months  02/26/2022 Molly Potter, [place] (PCP Office Visit) for Cough- Started: Benzonatate 200 mg daily prn, Fluticasone-Umeclidin-Vilant 100-62.5-25 mcg 1 puff daily, No orders placed,   12/24/2021 Molly Joe, FNP (PCP Office Visit) for HTN/DM- Changed: Increased Losartan Potassium to 100 mg daily, Lab orders placed, Patient to follow-up in 3 months  Recent consult visits:  05/11/2022 04/20/2022 Molly Hake, MD (Phys Med) for Follow-up- No medication changes noted, No orders placed, Patient to follow-up prn  04/20/2022 Molly Hake, MD (Phys Med) for Pain-Started: Gabapentin 100 mg to be slowly titrated up to 3 times a day, patient to follow-up in 2 to 3 weeks  04/07/2022 Molly Potter, PT (Rehab Services) for PT Treatment- No medication changes noted, No orders placed, No follow-up noted  03/31/2022 Molly Potter, PT (Rehab Services) for PT Treatment- No medication changes noted, No orders placed, No follow-up noted  03/10/2022 Molly Potter, PT (Rehab Services) for PT Treatment- No medication changes noted, No orders placed, No follow-up noted  03/03/2022 Molly Potter, PT (Rehab Services) for PT Treatment- No medication changes noted, No orders placed, No follow-up noted  02/22/2022 Molly Potter, PT (Rehab Services) for PT Treatment- No medication changes noted, No orders placed, No follow-up noted  02/01/2022 Molly Potter, PT (Rehab Services) for PT Treatment- No medication changes noted, No orders placed, No follow-up noted  Hospital visits:  None in previous 6 months  Medications: Outpatient Encounter Medications as of 06/18/2022  Medication Sig Note   acetaminophen (TYLENOL) 500 MG tablet Take 1,000 mg by mouth every 6 (six) hours as needed for moderate pain.    benzonatate (TESSALON) 200 MG capsule Take 1 capsule (200 mg total) by mouth 3 (three) times daily as needed for cough.    blood glucose meter kit and supplies Dispense based on patient and insurance preference. To check fasting blood sugar once daily. E1.22    cholecalciferol (VITAMIN D3) 25 MCG (1000 UNIT) tablet Take 1,000 Units by mouth daily.    cyclobenzaprine (FLEXERIL) 5 MG tablet Take 1 tablet (5 mg total) by mouth at bedtime.    diclofenac Sodium (VOLTAREN) 1 % GEL Apply 2 g topically daily as needed (pain).    diltiazem (CARDIZEM CD) 240 MG 24 hr capsule Take 1 capsule (240 mg total) by mouth daily.    docusate sodium (COLACE) 100 MG capsule Take 1 capsule (100 mg total) by mouth 2 (two) times daily. To keep stools soft    Fluticasone-Umeclidin-Vilant (TRELEGY ELLIPTA) 100-62.5-25 MCG/ACT AEPB Inhale 1 puff into the lungs daily. (Patient not taking: Reported on 04/05/2022) 04/05/2022: Patient reported only using this one time.   glipiZIDE (GLUCOTROL XL) 10 MG 24 hr tablet Take 1 tablet (10 mg total) by mouth daily with lunch.    glucose blood (CONTOUR NEXT TEST) test strip To check BG BID    lactulose (CHRONULAC) 10 GM/15ML solution Take 15 mLs (10  g total) by mouth 2 (two) times daily as needed for mild constipation.    loratadine (CLARITIN) 10 MG tablet Take 10 mg by mouth daily as needed for allergies.    losartan (COZAAR) 100 MG tablet Take 1 tablet (100 mg total) by mouth daily.    metFORMIN  (GLUCOPHAGE-XR) 500 MG 24 hr tablet Take 2 tablets (1,000 mg total) by mouth 2 (two) times daily with a meal.    Microlet Lancets MISC To check BG BID    montelukast (SINGULAIR) 10 MG tablet Take 1 tablet (10 mg total) by mouth at bedtime.    Multiple Vitamin (MULTIVITAMIN) tablet Take 1 tablet by mouth daily.    Polyethyl Glycol-Propyl Glycol (SYSTANE OP) Place 1 drop into both eyes daily as needed (dry/irritated eyes).    rosuvastatin (CRESTOR) 5 MG tablet Take 1 tablet (5 mg total) by mouth daily.    temazepam (RESTORIL) 7.5 MG capsule Take 1 capsule (7.5 mg total) by mouth at bedtime as needed for sleep. (Patient not taking: Reported on 04/05/2022)    traZODone (DESYREL) 50 MG tablet Take 0.5-1 tablets (25-50 mg total) by mouth at bedtime as needed for sleep.    No facility-administered encounter medications on file as of 06/18/2022.   Care Gaps: Zoster Vaccines Influenza Vaccine A1C > 9  Star Rating Drugs: Glipizide 10 mg last filled on 03/24/2022 for a 90-Day supply with [facility] Losartan 100 mg last filled on 04/09/2022 for a 90-Day supply with [facility] Metformin 500 mg last filled on 05/12/2022 for a 90-Day supply with [place] Rosuvastatin 5 mg last filled on 04/09/2022 for a 90-Day supply with [facility]  Questions for Clinical Pharmacist:   1.Are you able to connect with Patient? Yes    2.Confirmed appointment date/time with patient/caregiver? Confirm appointment on 06/21/2022 at 1300 with Molly Potter,  CPP    3.Visit type telephone    4.Patient/Caregiver instructed to bring medications to appointment. Patient is aware to bring all medications and supplements to appointment   5.What, if any, problems do you have getting your medications from the pharmacy? Financial barriers    6.What is your top health concern to discuss at your upcoming visit? Patient reports that she is aware her A1C is elevated and she would like to get that under  control. Patient is currently not taking any medications for her DM.   Per patient she never started the Metformin as she doesn't like the side effects. She reports that she has a really sensitive stomach. Patient stated she has friends on Metformin and they advised her that this medication can upset the stomach and that is a chance she isn't willing to take.   Patient reports she did try the Glipizide but it caused her a severe case of constipation. Patient isn't a fan of prescribed medications, but she is willing to try something if necessary that will not cause her stomach issues.  Patient stated she has realized that her DM is serious so she has started exercising. Patient was doing water aerobics but due to the crowding at the gym she has chosen to stop this exercise. 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  Patient reports she has started trying to watch what she eats, and she does walk daily. Patient stated that she does not check her blood sugars daily but she does check them a  few times weekly. Her highest fasting blood sugar was 202 and she reports at that time she was just eating what she wanted to. Her recent fasting blood sugars have been 172, 142, 164, 145, 150, 133, 147. Patient stated that if she does agree to a medication cost could be an issue and it would probably require patient assistance.  Patient instructed that CPP would speak with her and provide her with the best options to help her with consideration of her sensitive system. If patient has to go on a medication she would like to have a sample first prior to a commitment.    7.Have you seen any other providers since your last visit? No    Lynann Bologna, CPA/CMA Clinical Pharmacist Assistant Phone: [phone]

## 2022-06-21 ENCOUNTER — Ambulatory Visit (INDEPENDENT_AMBULATORY_CARE_PROVIDER_SITE_OTHER): Payer: PPO

## 2022-06-21 DIAGNOSIS — E1159 Type 2 diabetes mellitus with other circulatory complications: Secondary | ICD-10-CM

## 2022-06-21 DIAGNOSIS — E1169 Type 2 diabetes mellitus with other specified complication: Secondary | ICD-10-CM

## 2022-06-21 MED ORDER — EMPAGLIFLOZIN 25 MG PO TABS: 25.0000 mg | ORAL_TABLET | Freq: Every day | ORAL | 0 refills | Status: DC

## 2022-06-21 NOTE — Progress Notes (Unsigned)
Chronic Care Management Pharmacy Note  06/21/2022 Name:  Molly Potter MRN:  [id number] DOB:  11/07/1955  Summary: ***  Recommendations/Changes made from today's visit: ***  Plan: ***   Subjective: Molly Potter is an 66 y.o. year old female who is a primary patient of Gwyneth Sprout, FNP.  The CCM team was consulted for assistance with disease management and care coordination needs.    Engaged with patient by telephone for initial visit in response to provider referral for pharmacy case management and/or care coordination services.   Consent to Services:  The patient was given the following information about Chronic Care Management services today, agreed to services, and gave verbal consent: 1. CCM service includes personalized support from designated clinical staff supervised by the primary care provider, including individualized plan of care and coordination with other care providers 2. 24/7 contact phone numbers for assistance for urgent and routine care needs. 3. Service will only be billed when office clinical staff spend 20 minutes or more in a month to coordinate care. 4. Only one practitioner may furnish and bill the service in a calendar month. 5.The patient may stop CCM services at any time (effective at the end of the month) by phone call to the office staff. 6. The patient will be responsible for cost sharing (co-pay) of up to 20% of the service fee (after annual deductible is met). Patient agreed to services and consent obtained.  Patient Care Team: Gwyneth Sprout, FNP as PCP - General (Family Medicine) Germaine Pomfret, [facility] (Pharmacist)  Recent office visits: 03/24/2022 [name], [place] (PCP Office Visit) for Annual Physical Exam- Started: Glipizide 10 mg daily with lunch, Lab orders placed, DM Eye Exam order placed, Patient to follow-up in 3 months   02/26/2022 [name], [place] (PCP Office Visit) for Cough- Started: Benzonatate 200 mg daily prn,  Fluticasone-Umeclidin-Vilant 100-62.5-25 mcg 1 puff daily, No orders placed,    12/24/2021 [name], FNP (PCP Office Visit) for HTN/DM- Changed: Increased Losartan Potassium to 100 mg daily, Lab orders placed, Patient to follow-up in 3 months  Recent consult visits: 05/11/2022 04/20/2022 [name], MD (Phys Med) for Follow-up- No medication changes noted, No orders placed, Patient to follow-up prn   04/20/2022 [name], MD (Phys Med) for Pain-Started: Gabapentin 100 mg to be slowly titrated up to 3 times a day, patient to follow-up in 2 to 3 weeks   04/07/2022 Geoffry Paradise, PT (Rehab Services) for PT Treatment  03/31/2022 Geoffry Paradise, PT (Rehab Services) for PT Treatment  03/10/2022 Geoffry Paradise, PT (Rehab Services) for PT Treatment 03/03/2022 Geoffry Paradise, PT (Rehab Services) for PT Treatment  02/22/2022 Geoffry Paradise, PT (Rehab Services) for PT Treatment  02/01/2022 Geoffry Paradise, PT (Rehab Services) for [place] [facility] visits: None in previous 6 months   Objective:  Lab Results  Component Value Date   CREATININE 0.81 07/20/2021   BUN 10 07/20/2021   EGFR 81 07/20/2021   GFRNONAA >60 02/03/2021   GFRAA 85 07/23/2020   NA 141 07/20/2021   K 4.5 07/20/2021   CALCIUM 9.6 07/20/2021   CO2 24 07/20/2021   GLUCOSE 173 (H) 07/20/2021    Lab Results  Component Value Date/Time   HGBA1C 9.3 (A) 03/24/2022 09:12 AM   HGBA1C 9.0 (A) 12/24/2021 09:02 AM   HGBA1C 8.4 (H) 07/20/2021 09:35 AM   HGBA1C 7.2 (H) 07/23/2020 11:18 AM   MICROALBUR negative 07/03/2018 04:43 PM    Last diabetic Eye exam:  Lab  Results  Component Value Date/Time   HMDIABEYEEXA No Retinopathy 03/18/2022 12:00 AM    Last diabetic Foot exam: No results found for: "HMDIABFOOTEX"   Lab Results  Component Value Date   CHOL 142 07/20/2021   HDL 46 07/20/2021   LDLCALC 72 07/20/2021   TRIG 137 07/20/2021   CHOLHDL 3.1 07/20/2021       Latest Ref Rng & Units  07/20/2021    9:35 AM 01/19/2021    3:34 PM 07/23/2020   11:18 AM  Hepatic Function  Total Protein 6.0 - 8.5 g/dL 7.0  6.9  7.2   Albumin 3.8 - 4.8 g/dL 4.6  4.3  4.6   AST 0 - 40 IU/L '13  18  18   ' ALT 0 - 32 IU/L '12  13  15   ' Alk Phosphatase 44 - 121 IU/L 100  89  85   Total Bilirubin 0.0 - 1.2 mg/dL 0.3  <0.2  0.2     Lab Results  Component Value Date/Time   TSH 1.940 07/20/2021 09:35 AM   TSH 2.240 01/19/2021 03:34 PM   FREET4 1.10 05/28/2015 12:44 PM       Latest Ref Rng & Units 07/20/2021    9:35 AM 02/03/2021    5:09 AM 01/26/2021    2:00 PM  CBC  WBC 3.4 - 10.8 x10E3/uL 9.0  16.0  10.7   Hemoglobin 11.1 - 15.9 g/dL 13.7  12.2  12.9   Hematocrit 34.0 - 46.6 % 39.6  36.4  38.8   Platelets 150 - 450 x10E3/uL 399  343  418     No results found for: "VD25OH"  Clinical ASCVD: {YES/NO:21197} The 10-year ASCVD risk score (Arnett DK, et al., 2019) is: 12.3%   Values used to calculate the score:     Age: 89 years     Sex: Female     Is Non-Hispanic African American: Yes     Diabetic: Yes     Tobacco smoker: No     Systolic Blood Pressure: 563 mmHg     Is BP treated: Yes     HDL Cholesterol: 46 mg/dL     Total Cholesterol: 142 mg/dL       03/24/2022    9:07 AM 02/26/2022    8:37 AM 07/20/2021    9:02 AM  Depression screen PHQ 2/9  Decreased Interest 0 0 0  Down, Depressed, Hopeless 0 0 0  PHQ - 2 Score 0 0 0  Altered sleeping 1 1 0  Tired, decreased energy '1 1 1  ' Change in appetite '1 1 1  ' Feeling bad or failure about yourself  0 0 0  Trouble concentrating 0 0 0  Moving slowly or fidgety/restless 0 0 0  Suicidal thoughts 0 0 0  PHQ-9 Score '3 3 2  ' Difficult doing work/chores Not difficult at all Not difficult at all Not difficult at all     ***Other: (CHADS2VASc if Afib, MMRC or CAT for COPD, ACT, DEXA)  Social History   Tobacco Use  Smoking Status Never  Smokeless Tobacco Never   BP Readings from Last 3 Encounters:  03/24/22 (!) 130/56  02/26/22 (!)  123/55  12/24/21 113/77   Pulse Readings from Last 3 Encounters:  03/24/22 76  02/26/22 72  12/24/21 74   Wt Readings from Last 3 Encounters:  03/24/22 186 lb (84.4 kg)  02/26/22 188 lb 8 oz (85.5 kg)  12/24/21 187 lb 14.4 oz (85.2 kg)   BMI Readings from Last  3 Encounters:  03/24/22 30.95 kg/m  02/26/22 31.37 kg/m  12/24/21 31.27 kg/m    Assessment/Interventions: Review of patient past medical history, allergies, medications, health status, including review of consultants reports, laboratory and other test data, was performed as part of comprehensive evaluation and provision of chronic care management services.   SDOH:  (Social Determinants of Health) assessments and interventions performed: {yes/no:20286} SDOH Interventions    Flowsheet Row Office Visit from 07/20/2021 in [facility] Interventions   Depression Interventions/Treatment  PHQ2-9 Score <4 Follow-up Not Indicated      SDOH Screenings   Alcohol Screen: Low Risk  (03/24/2022)  Depression (PHQ2-9): Low Risk  (03/24/2022)  Tobacco Use: Low Risk  (04/07/2022)    CCM Care Plan  Allergies  Allergen Reactions   Effexor [Venlafaxine] Anaphylaxis   Hylan G-F 20 Other (See Comments)    Myalgias, hand pain, back pain; Benadryl helped pain Other reaction(s): Other Myalgias, hand pain, back pain; Benadryl helped pain Burning sensation all over body   Diazepam     Other reaction(s): Unknown   Tramadol Nausea Only   Methylprednisolone Nausea Only and Other (See Comments)    Headaches and chest pain    Medications Reviewed Today     Reviewed by Elza Rafter, PT (Physical Therapist) on 04/07/22 at [place] List Status: <None>   Medication Order Taking? Sig Documenting Provider Last Dose Status Informant  acetaminophen (TYLENOL) 500 MG tablet [id number] No Take 1,000 mg by mouth every 6 (six) hours as needed for moderate pain. [provider] Taking Active Self  benzonatate  (TESSALON) 200 MG capsule [id number] No Take 1 capsule (200 mg total) by mouth 3 (three) times daily as needed for cough. Gwyneth Sprout, FNP Taking Active   cholecalciferol (VITAMIN D3) 25 MCG (1000 UNIT) tablet [id number] No Take 1,000 Units by mouth daily. [provider] Taking Active Self  cyclobenzaprine (FLEXERIL) 5 MG tablet [id number] No Take 1 tablet (5 mg total) by mouth at bedtime. Gwyneth Sprout, FNP Taking Active   diclofenac Sodium (VOLTAREN) 1 % GEL [id number] No Apply 2 g topically daily as needed (pain). [provider] Taking Active Self  diltiazem (CARDIZEM CD) 240 MG 24 hr capsule [id number] No Take 1 capsule (240 mg total) by mouth daily. Gwyneth Sprout, FNP Taking Active   docusate sodium (COLACE) 100 MG capsule [id number] No Take 1 capsule (100 mg total) by mouth 2 (two) times daily. To keep stools soft Benjaman Kindler, MD Taking Active   Fluticasone-Umeclidin-Vilant (TRELEGY ELLIPTA) 100-62.5-25 MCG/ACT AEPB [id number] No Inhale 1 puff into the lungs daily.  Patient not taking: Reported on 04/05/2022   Gwyneth Sprout, FNP Not Taking Active            Med Note Corinna Lines Apr 05, 2022  6:25 PM) Patient reported only using this one time.  glipiZIDE (GLUCOTROL XL) 10 MG 24 hr tablet [id number] No Take 1 tablet (10 mg total) by mouth daily with lunch. Gwyneth Sprout, FNP Taking Active   glucose blood (CONTOUR NEXT TEST) test strip [id number] No To check BG BID Mar Daring, PA-C Taking Active Self  loratadine (CLARITIN) 10 MG tablet [id number] No Take 10 mg by mouth daily as needed for allergies. [provider] Taking Active Self  losartan (COZAAR) 100 MG tablet [id number] No Take 1 tablet (100 mg total) by mouth daily. Gwyneth Sprout, FNP Taking Active   Microlet Lancets MISC [id number]  No To check BG BID Fenton Malling M, PA-C Taking Active Self  montelukast (SINGULAIR) 10 MG tablet [id number] No Take 1 tablet (10 mg total) by mouth at  bedtime. Gwyneth Sprout, FNP Taking Active   Multiple Vitamin (MULTIVITAMIN) tablet [id number] No Take 1 tablet by mouth daily. [provider] Taking Active Self  Polyethyl Glycol-Propyl Glycol (SYSTANE OP) [id number] No Place 1 drop into both eyes daily as needed (dry/irritated eyes). [provider] Taking Active Self  rosuvastatin (CRESTOR) 5 MG tablet [id number] No Take 1 tablet (5 mg total) by mouth daily. Gwyneth Sprout, FNP Taking Active   temazepam (RESTORIL) 7.5 MG capsule [id number] No Take 1 capsule (7.5 mg total) by mouth at bedtime as needed for sleep.  Patient not taking: Reported on 04/05/2022   Gwyneth Sprout, FNP Not Taking Active   traZODone (DESYREL) 50 MG tablet [id number] No Take 0.5-1 tablets (25-50 mg total) by mouth at bedtime as needed for sleep. Gwyneth Sprout, FNP Taking Active             Patient Active Problem List   Diagnosis Date Noted   Annual physical exam 03/24/2022   Bronchospasm, acute 02/26/2022   Snoring 09/25/2021   Psychophysiological insomnia 09/25/2021   Seasonal allergic rhinitis due to pollen 09/25/2021   Pelvic organ prolapse quantification stage 2 cystocele 02/02/2021   Lumbar radiculopathy 07/21/2020   T2DM (type 2 diabetes mellitus) ([place]) 01/03/2019   Hyperlipidemia associated with type 2 diabetes mellitus ([place]) 06/20/2018   Primary osteoarthritis of both knees 02/16/2017   Impingement syndrome of shoulder 07/02/2015   Complete rotator cuff rupture of left shoulder 07/02/2015   Cervical radiculitis 07/02/2015   Impingement syndrome of both shoulders 07/02/2015   Arthritis sicca 04/09/2015   Hypertension associated with diabetes ([place]) 04/09/2015   H/O eczema 04/09/2015   Hyperthyroidism 04/09/2015   Arthritis 04/09/2015    Immunization History  Administered Date(s) Administered   Influenza Inj Mdck Quad Pf 09/19/2018   Influenza,inj,Quad PF,6+ Mos 07/03/2014, 06/29/2019   Influenza-Unspecified 06/27/2020, 06/26/2021    Moderna Sars-Covid-2 Vaccination 12/13/2019, 01/10/2020   Tdap 06/29/2019   Zoster, Live 11/08/2011    Conditions to be addressed/monitored:  Hypertension, Hyperlipidemia, Diabetes, and Osteoarthritis  There are no care plans that you recently modified to display for this patient.    Medication Assistance: {MEDASSISTANCEINFO:25044}  Compliance/Adherence/Medication fill history: Care Gaps: ***  Star-Rating Drugs: ***  Patient's preferred pharmacy is:  [facility] [address], [place] - [place] [address] Phone: [phone] Fax: [phone]  Uses pill box? {Yes or If no, why not?:20788} Pt endorses ***% compliance  We discussed: {Pharmacy options:24294} Patient decided to: {US Pharmacy Plan:23885}  Care Plan and Follow Up Patient Decision:  {FOLLOWUP:24991}  Plan: {CM FOLLOW UP KFEX:61470}  ***  Current Barriers:  {pharmacybarriers:24917}  Pharmacist Clinical Goal(s):  Patient will {PHARMACYGOALCHOICES:24921} through collaboration with PharmD and provider.   Interventions: 1:1 collaboration with Gwyneth Sprout, FNP regarding development and update of comprehensive plan of care as evidenced by provider attestation and co-signature Inter-disciplinary care team collaboration (see longitudinal plan of care) Comprehensive medication review performed; medication list updated in electronic medical record  Hypertension (BP goal <130/80) -Controlled -Current treatment: Diltiazem CD 240 mg daily  Losartan 100 mg daily  -Medications previously tried: Amlodipine, Metoprolol, Propanolol, Verapamil   -Current home readings: 117-120s/60s.  -Current dietary habits: *** -Current exercise habits: *** -{ACTIONS;DENIES/REPORTS:21021675::"Denies"} hypotensive/hypertensive symptoms -Educated on {CCM BP Counseling:25124} -Counseled to monitor BP  at home ***, document, and provide log at future  appointments -{CCMPHARMDINTERVENTION:25122}  Hyperlipidemia: (LDL goal < ***) -{US controlled/uncontrolled:25276} -Current treatment: Rosuvastatin 5 mg daily  -Medications previously tried: ***  -Current dietary patterns: *** -Current exercise habits: *** -Educated on {CCM HLD Counseling:25126} -{CCMPHARMDINTERVENTION:25122}  Diabetes (A1c goal <7%) -{US controlled/uncontrolled:25276} -Current medications: None  -Medications previously tried: Glipizide (Constipation) -Never started metformin due to concerns she heard from friends   -Current home glucose readings fasting glucose: 172, 142, 164, 145, 150, 133, 147 post prandial glucose: *** -{ACTIONS;DENIES/REPORTS:21021675::"Denies"} hypoglycemic/hyperglycemic symptoms -Current meal patterns: Trying to eat healthier, smaller portions overall.  snacks: Activia Yogurt drinks: Decaf coffee or herbal tea most mornings. Drinks 6-8 bottles of water.  -Current exercise: Walking most days, [employer] aerobics less these days due to [place].  -Hesitant about injectables.  -{CCMPHARMDINTERVENTION:25122}  Constipation (Goal: ***) -{US controlled/uncontrolled:25276} -Current treatment  Docusate 100 mg PRN Takes 1-2 times daily  -Medications previously tried: Office manager (racing heart)  -Acitiva Yogurt -{CCMPHARMDINTERVENTION:25122}   Patient Goals/Self-Care Activities Patient will:  - {pharmacypatientgoals:24919}  Follow Up Plan: {CM FOLLOW UP AFBX:03833}

## 2022-06-22 ENCOUNTER — Telehealth: Payer: Self-pay

## 2022-06-22 NOTE — Patient Instructions (Signed)
Visit Information It was great speaking with you today!  Please let me know if you have any questions about our visit.  Patient Care Plan: General Pharmacy (Adult)     Problem Identified: Hypertension, Hyperlipidemia, Diabetes, and Osteoarthritis   Priority: High     Long-Range Goal: Patient-Specific Goal   Start Date: 06/22/2022  Expected End Date: 06/23/2023  This Visit's Progress: On track  Priority: High  Note:   Current Barriers:  Unable to independently afford treatment regimen Unable to achieve control of Diabetes, Constipation   Pharmacist Clinical Goal(s):  Patient will achieve control of diabetes as evidenced by A1c less than 7% through collaboration with PharmD and provider.   Interventions: 1:1 collaboration with Gwyneth Sprout, FNP regarding development and update of comprehensive plan of care as evidenced by provider attestation and co-signature Inter-disciplinary care team collaboration (see longitudinal plan of care) Comprehensive medication review performed; medication list updated in electronic medical record  Hypertension (BP goal <130/80) -Controlled -Current treatment: Diltiazem CD 240 mg daily  Losartan 100 mg daily  -Medications previously tried: Amlodipine, Metoprolol, Propanolol, Verapamil   -Current home readings: 117-120s/60s.  -Current dietary habits: Does add salt in her cooking. -Denies hypotensive/hypertensive symptoms -Recommended to continue current medication  Hyperlipidemia: (LDL goal < 70) -Controlled -Current treatment: Rosuvastatin 5 mg daily  -Medications previously tried: NA  -Recommended to continue current medication  Diabetes (A1c goal <7%) -Uncontrolled -Current medications: None  -Medications previously tried: Glipizide (Constipation) -Never started metformin due to concerns she heard from friends regarding GI upset.   -Hesitant about injectable medications.  -Current home glucose readings fasting glucose: 172, 142,  164, 145, 150, 133, 147 -Denies hypoglycemic/hyperglycemic symptoms -Current meal patterns: Trying to eat healthier, smaller portions overall.  snacks: Activia Yogurt drinks: Decaf coffee or herbal tea most mornings. Drinks 6-8 bottles of water.  -Current exercise: Walking most days, [employer] aer[employer]ics less these days due to COVID.  -STOP Glipizide, Metformin (Patient not taking)  -START Jardiance 25 mg daily. 2-week voucher sent into pharmacy for patient to try. Will start PAP for Jardiance.   Constipation (Goal: Improve symptoms) -Uncontrolled -Current treatment  Docusate 100 mg as needed Takes 1-2 times weekly  Lactulose as needed  -Medications previously tried: Miralax (racing heart) -Rarely uses lactulose due to complaints of cramping, flatulence.  -Acitiva Yogurt daily  -Patient continues to struggle with constipation. Counseled patient on adequate water intake, increasing dietary fiber intake.  -INCREASE docusate to once daily rather than PRN.  -MODIFY Lactulose use to weekends when she does not need to work.   Patient Goals/Self-Care Activities Patient will:  - check glucose daily before breakfast, document, and provide at future appointments check blood pressure weekly, document, and provide at future appointments  Follow Up Plan: Telephone follow up appointment with care management team member scheduled for:  07/20/2022 at 8:30 AM    Ms. Linville was given information about Chronic Care Management services today including:  CCM service includes personalized support from designated clinical staff supervised by her physician, including individualized plan of care and coordination with other care providers 24/7 contact phone numbers for assistance for urgent and routine care needs. Standard insurance, coinsurance, copays and deductibles apply for chronic care management only during months in which we provide at least 20 minutes of these services. Most insurances cover these services  at 100%, however patients may be responsible for any copay, coinsurance and/or deductible if applicable. This service may help you avoid the need for more expensive face-to-face services. Only  one practitioner may furnish and bill the service in a calendar month. The patient may stop CCM services at any time (effective at the end of the month) by phone call to the office staff.  Patient agreed to services and verbal consent obtained.   Patient verbalizes understanding of instructions and care plan provided today and agrees to view in [place]. Active MyChart status and patient understanding of how to access instructions and care plan via MyChart confirmed with patient.     [name], PharmD, Para [place], CPP  Clinical Pharmacist Practitioner  [facility] [phone]

## 2022-06-22 NOTE — Progress Notes (Signed)
    Chronic Care Management Pharmacy Assistant   Name: Molly Potter  MRN: [id number] DOB: 1955-11-14  Reason for Encounter: Patient Assistance Application for Molly Potter, CPP sent me a message requesting that we start the patient assistance application for Jardiance. Patient will be in office to pick up application on 07/62/2633  Application for Jardiance started with [facility].  Application e-mailed to CPP to print for patient to pick up in office.  Medications: Outpatient Encounter Medications as of 06/22/2022  Medication Sig Note   acetaminophen (TYLENOL) 500 MG tablet Take 500 mg by mouth 2 (two) times daily.    blood glucose meter kit and supplies Dispense based on patient and insurance preference. To check fasting blood sugar once daily. E1.22    cholecalciferol (VITAMIN D3) 25 MCG (1000 UNIT) tablet Take 1,000 Units by mouth daily.    cyclobenzaprine (FLEXERIL) 5 MG tablet Take 1 tablet (5 mg total) by mouth at bedtime.    diclofenac Sodium (VOLTAREN) 1 % GEL Apply 2 g topically daily as needed (pain).    diltiazem (CARDIZEM CD) 240 MG 24 hr capsule Take 1 capsule (240 mg total) by mouth daily.    docusate sodium (COLACE) 100 MG capsule Take 1 capsule (100 mg total) by mouth 2 (two) times daily. To keep stools soft (Patient taking differently: Take 100 mg by mouth daily as needed. To keep stools soft)    empagliflozin (JARDIANCE) 25 MG TABS tablet Take 1 tablet (25 mg total) by mouth daily before breakfast.    gabapentin (NEURONTIN) 100 MG capsule Take 100 mg by mouth at bedtime. 06/21/2022: Prescribed by Dr. Alba Destine   glucose blood (CONTOUR NEXT TEST) test strip To check BG BID    lactulose (CHRONULAC) 10 GM/15ML solution Take 15 mLs (10 g total) by mouth 2 (two) times daily as needed for mild constipation. (Patient not taking: Reported on 06/21/2022)    loratadine (CLARITIN) 10 MG tablet Take 10 mg by mouth daily as needed for allergies.    losartan  (COZAAR) 100 MG tablet Take 1 tablet (100 mg total) by mouth daily.    Microlet Lancets MISC To check BG BID    montelukast (SINGULAIR) 10 MG tablet Take 1 tablet (10 mg total) by mouth at bedtime.    Multiple Vitamin (MULTIVITAMIN) tablet Take 1 tablet by mouth daily. Women's 50+    Polyethyl Glycol-Propyl Glycol (SYSTANE OP) Place 1 drop into both eyes daily as needed (dry/irritated eyes).    rosuvastatin (CRESTOR) 5 MG tablet Take 1 tablet (5 mg total) by mouth daily.    No facility-administered encounter medications on file as of 06/22/2022.    Lynann Bologna, CPA/CMA Clinical Pharmacist Assistant Phone: [phone]

## 2022-06-24 ENCOUNTER — Encounter: Payer: Self-pay | Admitting: Family Medicine

## 2022-06-24 ENCOUNTER — Ambulatory Visit (INDEPENDENT_AMBULATORY_CARE_PROVIDER_SITE_OTHER): Payer: PPO | Admitting: Family Medicine

## 2022-06-24 VITALS — BP 128/69 | HR 76 | Resp 16 | Ht 65.0 in | Wt 185.0 lb

## 2022-06-24 DIAGNOSIS — E1169 Type 2 diabetes mellitus with other specified complication: Secondary | ICD-10-CM

## 2022-06-24 DIAGNOSIS — I152 Hypertension secondary to endocrine disorders: Secondary | ICD-10-CM

## 2022-06-24 DIAGNOSIS — E785 Hyperlipidemia, unspecified: Secondary | ICD-10-CM | POA: Diagnosis not present

## 2022-06-24 DIAGNOSIS — E119 Type 2 diabetes mellitus without complications: Secondary | ICD-10-CM | POA: Insufficient documentation

## 2022-06-24 DIAGNOSIS — E1159 Type 2 diabetes mellitus with other circulatory complications: Secondary | ICD-10-CM

## 2022-06-24 DIAGNOSIS — Z23 Encounter for immunization: Secondary | ICD-10-CM | POA: Insufficient documentation

## 2022-06-24 DIAGNOSIS — E1122 Type 2 diabetes mellitus with diabetic chronic kidney disease: Secondary | ICD-10-CM | POA: Diagnosis not present

## 2022-06-24 DIAGNOSIS — N1831 Chronic kidney disease, stage 3a: Secondary | ICD-10-CM | POA: Diagnosis not present

## 2022-06-24 MED ORDER — LOSARTAN POTASSIUM 100 MG PO TABS: 100.0000 mg | ORAL_TABLET | Freq: Every day | ORAL | 1 refills | Status: AC

## 2022-06-24 MED ORDER — ROSUVASTATIN CALCIUM 5 MG PO TABS: 5.0000 mg | ORAL_TABLET | Freq: Every day | ORAL | 1 refills | Status: AC

## 2022-06-24 NOTE — Assessment & Plan Note (Signed)
Consented; VIS made available; no immediate side effects following administration; plan to repeat annually   

## 2022-06-24 NOTE — Assessment & Plan Note (Addendum)
Chronic, previously stable with use of crestor 5 Repeat LP; LDL goal of 55-70 I recommend diet low in saturated fat and regular exercise - 30 min at least 5 times per week

## 2022-06-24 NOTE — Assessment & Plan Note (Signed)
Chronic, stable Continue dilt 240, losartan 100 Check CBC and CMP Goal of <130/<80

## 2022-06-24 NOTE — Assessment & Plan Note (Signed)
Chronic, unstable Failure of jardiance d/t side effects of "feeling like by body is lit on fire" as well as nasuea and diarrhea; will reach out to CCM pharm d [name] to discuss alternatives. UTD on eye exam On station Or [facility] Continue to recommend balanced, lower carb meals. Smaller meal size, adding snacks. Choosing water as drink of choice and increasing purposeful exercise. Repeat A1c today; goal <8%

## 2022-06-24 NOTE — Assessment & Plan Note (Signed)
Chronic, unstable Complicated by many drug side effects Leg pains have improved since seeing specialist and starting gaba 100 qHS

## 2022-06-24 NOTE — Progress Notes (Signed)
Established patient visit   Patient: Molly Potter   DOB: [birth year]   66 y.o. Female  MRN: [id number] Visit Date: 06/24/2022  Today's healthcare provider: Gwyneth Sprout, FNP  Introduced to nurse practitioner role and practice setting.  All questions answered.  Discussed provider/patient relationship and expectations.   I,Tiffany J Bragg,acting as a scribe for Gwyneth Sprout, FNP.,have documented all relevant documentation on the behalf of Gwyneth Sprout, FNP,as directed by  Gwyneth Sprout, FNP while in the presence of Gwyneth Sprout, FNP.   Chief Complaint  Patient presents with   Diabetes   Subjective    HPI  Diabetes Mellitus Type II, Follow-up  Lab Results  Component Value Date   HGBA1C 9.3 (A) 03/24/2022   HGBA1C 9.0 (A) 12/24/2021   HGBA1C 7.9 (A) 09/25/2021   Wt Readings from Last 3 Encounters:  06/24/22 185 lb (83.9 kg)  03/24/22 186 lb (84.4 kg)  02/26/22 188 lb 8 oz (85.5 kg)   Last seen for diabetes 3 months ago.  Management since then includes no changes. She reports excellent compliance with treatment. She is having side effects. Stomach ache, diarrhea, skin burning. Symptoms: No fatigue No foot ulcerations  No appetite changes Yes nausea  Yes paresthesia of the feet  Yes polydipsia  Yes polyuria No visual disturbances   No vomiting     Home blood sugar records:  not checked regularly  Episodes of hypoglycemia? No    Current insulin regiment: NA Most Recent Eye Exam: August Current exercise: walking Current diet habits: well balanced  Pertinent Labs: Lab Results  Component Value Date   CHOL 142 07/20/2021   HDL 46 07/20/2021   LDLCALC 72 07/20/2021   TRIG 137 07/20/2021   CHOLHDL 3.1 07/20/2021   Lab Results  Component Value Date   NA 141 07/20/2021   K 4.5 07/20/2021   CREATININE 0.81 07/20/2021   EGFR 81 07/20/2021   MICROALBUR negative 07/03/2018   LABMICR 9.5 12/24/2021      ---------------------------------------------------------------------------------------------------   Medications: Outpatient Medications Prior to Visit  Medication Sig   acetaminophen (TYLENOL) 500 MG tablet Take 500 mg by mouth 2 (two) times daily.   blood glucose meter kit and supplies Dispense based on patient and insurance preference. To check fasting blood sugar once daily. E1.22   cholecalciferol (VITAMIN D3) 25 MCG (1000 UNIT) tablet Take 1,000 Units by mouth daily.   cyclobenzaprine (FLEXERIL) 5 MG tablet Take 1 tablet (5 mg total) by mouth at bedtime.   diclofenac Sodium (VOLTAREN) 1 % GEL Apply 2 g topically daily as needed (pain).   diltiazem (CARDIZEM CD) 240 MG 24 hr capsule Take 1 capsule (240 mg total) by mouth daily.   docusate sodium (COLACE) 100 MG capsule Take 1 capsule (100 mg total) by mouth 2 (two) times daily. To keep stools soft (Patient taking differently: Take 100 mg by mouth daily as needed. To keep stools soft)   empagliflozin (JARDIANCE) 25 MG TABS tablet Take 1 tablet (25 mg total) by mouth daily before breakfast.   gabapentin (NEURONTIN) 100 MG capsule Take 100 mg by mouth at bedtime.   glucose blood (CONTOUR NEXT TEST) test strip To check BG BID   lactulose (CHRONULAC) 10 GM/15ML solution Take 15 mLs (10 g total) by mouth 2 (two) times daily as needed for mild constipation.   loratadine (CLARITIN) 10 MG tablet Take 10 mg by mouth daily as needed for allergies.   Microlet Lancets MISC  To check BG BID   montelukast (SINGULAIR) 10 MG tablet Take 1 tablet (10 mg total) by mouth at bedtime.   Multiple Vitamin (MULTIVITAMIN) tablet Take 1 tablet by mouth daily. Women's 50+   Polyethyl Glycol-Propyl Glycol (SYSTANE OP) Place 1 drop into both eyes daily as needed (dry/irritated eyes).   [DISCONTINUED] losartan (COZAAR) 100 MG tablet Take 1 tablet (100 mg total) by mouth daily.   [DISCONTINUED] rosuvastatin (CRESTOR) 5 MG tablet Take 1 tablet (5 mg total) by mouth  daily.   No facility-administered medications prior to visit.    Review of Systems  Last CBC Lab Results  Component Value Date   WBC 9.0 07/20/2021   HGB 13.7 07/20/2021   HCT 39.6 07/20/2021   MCV 92 07/20/2021   MCH 31.8 07/20/2021   RDW 11.5 (L) 07/20/2021   PLT 399 15/86/8257   Last metabolic panel Lab Results  Component Value Date   GLUCOSE 173 (H) 07/20/2021   NA 141 07/20/2021   K 4.5 07/20/2021   CL 104 07/20/2021   CO2 24 07/20/2021   BUN 10 07/20/2021   CREATININE 0.81 07/20/2021   EGFR 81 07/20/2021   CALCIUM 9.6 07/20/2021   PHOS 3.9 05/28/2015   PROT 7.0 07/20/2021   ALBUMIN 4.6 07/20/2021   LABGLOB 2.4 07/20/2021   AGRATIO 1.9 07/20/2021   BILITOT 0.3 07/20/2021   ALKPHOS 100 07/20/2021   AST 13 07/20/2021   ALT 12 07/20/2021   ANIONGAP 6 02/03/2021   Last lipids Lab Results  Component Value Date   CHOL 142 07/20/2021   HDL 46 07/20/2021   LDLCALC 72 07/20/2021   TRIG 137 07/20/2021   CHOLHDL 3.1 07/20/2021   Last hemoglobin A1c Lab Results  Component Value Date   HGBA1C 9.3 (A) 03/24/2022   Last thyroid functions Lab Results  Component Value Date   TSH 1.940 07/20/2021      Objective    BP 128/69 (BP Location: Left Arm, Patient Position: Sitting, Cuff Size: Normal)   Pulse 76   Resp 16   Ht _0  (1.651 m)   Wt 185 lb (83.9 kg)   SpO2 99%   BMI 30.79 kg/m   BP Readings from Last 3 Encounters:  06/24/22 128/69  03/24/22 (!) 130/56  02/26/22 (!) 123/55   Wt Readings from Last 3 Encounters:  06/24/22 185 lb (83.9 kg)  03/24/22 186 lb (84.4 kg)  02/26/22 188 lb 8 oz (85.5 kg)   SpO2 Readings from Last 3 Encounters:  06/24/22 99%  03/24/22 97%  02/26/22 98%   Physical Exam Vitals and nursing note reviewed.  Constitutional:      General: She is not in acute distress.    Appearance: Normal appearance. She is obese. She is not ill-appearing, toxic-appearing or diaphoretic.  HENT:     Head: Normocephalic and  atraumatic.  Cardiovascular:     Rate and Rhythm: Normal rate and regular rhythm.     Pulses: Normal pulses.     Heart sounds: Normal heart sounds. No murmur heard.    No friction rub. No gallop.  Pulmonary:     Effort: Pulmonary effort is normal. No respiratory distress.     Breath sounds: Normal breath sounds. No stridor. No wheezing, rhonchi or rales.  Chest:     Chest wall: No tenderness.  Abdominal:     General: Bowel sounds are normal.     Palpations: Abdomen is soft.  Musculoskeletal:        General: No swelling,  tenderness, deformity or signs of injury. Normal range of motion.     Right lower leg: No edema.     Left lower leg: No edema.  Skin:    General: Skin is warm and dry.     Capillary Refill: Capillary refill takes less than 2 seconds.     Coloration: Skin is not jaundiced or pale.     Findings: No bruising, erythema, lesion or rash.  Neurological:     General: No focal deficit present.     Mental Status: She is alert and oriented to person, place, and time. Mental status is at baseline.     Cranial Nerves: No cranial nerve deficit.     Sensory: No sensory deficit.     Motor: No weakness.     Coordination: Coordination normal.  Psychiatric:        Mood and Affect: Mood normal.        Behavior: Behavior normal.        Thought Content: Thought content normal.        Judgment: Judgment normal.      No results found for any visits on 06/24/22.  Assessment & Plan     Problem List Items Addressed This Visit       Cardiovascular and Mediastinum   Hypertension associated with diabetes ([place])    Chronic, stable Continue dilt 240, losartan 100 Check CBC and CMP Goal of <130/<80      Relevant Medications   rosuvastatin (CRESTOR) 5 MG tablet   losartan (COZAAR) 100 MG tablet   Other Relevant Orders   CBC with Differential/Platelet   Comprehensive Metabolic Panel (CMET)     Endocrine   Hyperlipidemia associated with type 2 diabetes mellitus (HCC)     Chronic, previously stable with use of crestor 5 Repeat LP; LDL goal of 55-70 I recommend diet low in saturated fat and regular exercise - 30 min at least 5 times per week       Relevant Medications   rosuvastatin (CRESTOR) 5 MG tablet   losartan (COZAAR) 100 MG tablet   Other Relevant Orders   Lipid panel   Diabetes mellitus ([place]) - Primary    Chronic, unstable Complicated by many drug side effects Leg pains have improved since seeing specialist and starting gaba 100 qHS      Relevant Medications   rosuvastatin (CRESTOR) 5 MG tablet   losartan (COZAAR) 100 MG tablet   Other Relevant Orders   Hemoglobin A1c   Comprehensive Metabolic Panel (CMET)   Type 2 diabetes mellitus with stage 3a chronic kidney disease, without long-term current use of insulin (HCC)    Chronic, unstable Failure of jardiance d/t side effects of "feeling like by body is lit on fire" as well as nasuea and diarrhea; will reach out to CCM pharm d [name] to discuss alternatives. UTD on eye exam On station Or ARB Continue to recommend balanced, lower carb meals. Smaller meal size, adding snacks. Choosing water as drink of choice and increasing purposeful exercise. Repeat A1c today; goal <8%       Relevant Medications   rosuvastatin (CRESTOR) 5 MG tablet   losartan (COZAAR) 100 MG tablet   Other Relevant Orders   Comprehensive Metabolic Panel (CMET)     Other   Need for influenza vaccination    Consented; VIS made available; no immediate side effects following administration; plan to repeat annually        Relevant Orders   Flu Vaccine QUAD High Dose(Fluad) (Completed)  Return in about 3 months (around 09/24/2022) for chonic disease management.      I, Tiffany J Bragg, CMA, have reviewed all documentation for this visit. The documentation on 06/24/22 for the exam, diagnosis, procedures, and orders are all accurate and complete.    Gwyneth Sprout, [place] [phone] (phone) [phone] (fax)  [place]

## 2022-06-25 ENCOUNTER — Ambulatory Visit: Payer: PPO

## 2022-06-25 DIAGNOSIS — I152 Hypertension secondary to endocrine disorders: Secondary | ICD-10-CM

## 2022-06-25 DIAGNOSIS — E1169 Type 2 diabetes mellitus with other specified complication: Secondary | ICD-10-CM

## 2022-06-25 LAB — CBC WITH DIFFERENTIAL/PLATELET
Basophils Absolute: 0.1 10*3/uL (ref 0.0–0.2)
Basos: 1 %
EOS (ABSOLUTE): 0.2 10*3/uL (ref 0.0–0.4)
Eos: 3 %
Hematocrit: 39.2 % (ref 34.0–46.6)
Hemoglobin: 13.5 g/dL (ref 11.1–15.9)
Immature Grans (Abs): 0 10*3/uL (ref 0.0–0.1)
Immature Granulocytes: 0 %
Lymphocytes Absolute: 2.4 10*3/uL (ref 0.7–3.1)
Lymphs: 26 %
MCH: 31.4 pg (ref 26.6–33.0)
MCHC: 34.4 g/dL (ref 31.5–35.7)
MCV: 91 fL (ref 79–97)
Monocytes Absolute: 0.7 10*3/uL (ref 0.1–0.9)
Monocytes: 8 %
Neutrophils Absolute: 5.7 10*3/uL (ref 1.4–7.0)
Neutrophils: 62 %
Platelets: 413 10*3/uL (ref 150–450)
RBC: 4.3 x10E6/uL (ref 3.77–5.28)
RDW: 11.8 % (ref 11.7–15.4)
WBC: 9.1 10*3/uL (ref 3.4–10.8)

## 2022-06-25 LAB — COMPREHENSIVE METABOLIC PANEL
ALT: 13 IU/L (ref 0–32)
AST: 15 IU/L (ref 0–40)
Albumin/Globulin Ratio: 1.6 (ref 1.2–2.2)
Albumin: 4.4 g/dL (ref 3.9–4.9)
Alkaline Phosphatase: 89 IU/L (ref 44–121)
BUN/Creatinine Ratio: 19 (ref 12–28)
BUN: 15 mg/dL (ref 8–27)
Bilirubin Total: 0.2 mg/dL (ref 0.0–1.2)
CO2: 22 mmol/L (ref 20–29)
Calcium: 9.3 mg/dL (ref 8.7–10.3)
Chloride: 106 mmol/L (ref 96–106)
Creatinine, Ser: 0.81 mg/dL (ref 0.57–1.00)
Globulin, Total: 2.7 g/dL (ref 1.5–4.5)
Glucose: 141 mg/dL — ABNORMAL HIGH (ref 70–99)
Potassium: 4.2 mmol/L (ref 3.5–5.2)
Sodium: 142 mmol/L (ref 134–144)
Total Protein: 7.1 g/dL (ref 6.0–8.5)
eGFR: 81 mL/min/{1.73_m2} (ref >59)

## 2022-06-25 LAB — LIPID PANEL
Chol/HDL Ratio: 2.9 ratio (ref 0.0–4.4)
Cholesterol, Total: 124 mg/dL (ref 100–199)
HDL: 43 mg/dL (ref >39)
LDL Chol Calc (NIH): 61 mg/dL (ref 0–99)
Triglycerides: 111 mg/dL (ref 0–149)
VLDL Cholesterol Cal: 20 mg/dL (ref 5–40)

## 2022-06-25 LAB — HEMOGLOBIN A1C
Est. average glucose Bld gHb Est-mCnc: 197 mg/dL
Hgb A1c MFr Bld: 8.5 % — ABNORMAL HIGH (ref 4.8–5.6)

## 2022-06-25 NOTE — Progress Notes (Signed)
Chronic Care Management Pharmacy Note  06/25/2022 Name:  Molly Potter MRN:  [id number] DOB:  29-Oct-1955  Summary: Patient presents for initial CCM consult. Did not tolerate Jardiance. We discussed starting metformin or Rybelsus, patient hesitant due to her ongoing diarrhea.   Recommendations/Changes made from today's visit: STOP Jardiance  1-week wash out period, then will decide on starting metformin or Rybelsus if patient amenable.    Plan: HC to check with patient on Monday.  CPP Follow-up 1 month  Recommended Problem List Changes:  Add: Chronic idiopathic constipation [K59.04]  Subjective: Molly Potter is an 66 y.o. year old female who is a primary patient of Gwyneth Sprout, FNP.  The CCM team was consulted for assistance with disease management and care coordination needs.    Engaged with patient by telephone for follow up visit in response to provider referral for pharmacy case management and/or care coordination services.   Consent to Services:  The patient was given information about Chronic Care Management services, agreed to services, and gave verbal consent prior to initiation of services.  Please see initial visit note for detailed documentation.   Patient Care Team: Gwyneth Sprout, FNP as PCP - General (Family Medicine) Germaine Pomfret, [facility] (Pharmacist)  Recent office visits: 03/24/2022 [name], [place] (PCP Office Visit) for Annual Physical Exam- Started: Glipizide 10 mg daily with lunch, Lab orders placed, DM Eye Exam order placed, Patient to follow-up in 3 months   02/26/2022 [name], [place] (PCP Office Visit) for Cough- Started: Benzonatate 200 mg daily prn, Fluticasone-Umeclidin-Vilant 100-62.5-25 mcg 1 puff daily, No orders placed,    12/24/2021 [name], FNP (PCP Office Visit) for HTN/DM- Changed: Increased Losartan Potassium to 100 mg daily, Lab orders placed, Patient to follow-up in 3 months  Recent consult visits: 05/11/2022 04/20/2022  [name], MD (Phys Med) for Follow-up- No medication changes noted, No orders placed, Patient to follow-up prn   04/20/2022 [name], MD (Phys Med) for Pain-Started: Gabapentin 100 mg to be slowly titrated up to 3 times a day, patient to follow-up in 2 to 3 weeks   04/07/2022 Geoffry Paradise, PT (Rehab Services) for PT Treatment  03/31/2022 Geoffry Paradise, PT (Rehab Services) for PT Treatment  03/10/2022 Geoffry Paradise, PT (Rehab Services) for PT Treatment 03/03/2022 Geoffry Paradise, PT (Rehab Services) for PT Treatment  02/22/2022 Geoffry Paradise, PT (Rehab Services) for PT Treatment  02/01/2022 Geoffry Paradise, PT (Rehab Services) for [facility] visits: None in previous 6 months   Objective:  Lab Results  Component Value Date   CREATININE 0.81 06/24/2022   BUN 15 06/24/2022   EGFR 81 06/24/2022   GFRNONAA >60 02/03/2021   GFRAA 85 07/23/2020   NA 142 06/24/2022   K 4.2 06/24/2022   CALCIUM 9.3 06/24/2022   CO2 22 06/24/2022   GLUCOSE 141 (H) 06/24/2022    Lab Results  Component Value Date/Time   HGBA1C 8.5 (H) 06/24/2022 09:33 AM   HGBA1C 9.3 (A) 03/24/2022 09:12 AM   HGBA1C 9.0 (A) 12/24/2021 09:02 AM   HGBA1C 8.4 (H) 07/20/2021 09:35 AM   MICROALBUR negative 07/03/2018 04:43 PM    Last diabetic Eye exam:  Lab Results  Component Value Date/Time   HMDIABEYEEXA No Retinopathy 03/18/2022 12:00 AM    Last diabetic Foot exam: No results found for: "HMDIABFOOTEX"   Lab Results  Component Value Date   CHOL 124 06/24/2022   HDL 43 06/24/2022   LDLCALC 61 06/24/2022   TRIG 111  06/24/2022   CHOLHDL 2.9 06/24/2022       Latest Ref Rng & Units 06/24/2022    9:33 AM 07/20/2021    9:35 AM 01/19/2021    3:34 PM  Hepatic Function  Total Protein 6.0 - 8.5 g/dL 7.1  7.0  6.9   Albumin 3.9 - 4.9 g/dL 4.4  4.6  4.3   AST 0 - 40 IU/L '15  13  18   ' ALT 0 - 32 IU/L '13  12  13   ' Alk Phosphatase 44 - 121 IU/L 89  100  89   Total Bilirubin 0.0 -  1.2 mg/dL 0.2  0.3  <0.2     Lab Results  Component Value Date/Time   TSH 1.940 07/20/2021 09:35 AM   TSH 2.240 01/19/2021 03:34 PM   FREET4 1.10 05/28/2015 12:44 PM       Latest Ref Rng & Units 06/24/2022    9:33 AM 07/20/2021    9:35 AM 02/03/2021    5:09 AM  CBC  WBC 3.4 - 10.8 x10E3/uL 9.1  9.0  16.0   Hemoglobin 11.1 - 15.9 g/dL 13.5  13.7  12.2   Hematocrit 34.0 - 46.6 % 39.2  39.6  36.4   Platelets 150 - 450 x10E3/uL 413  399  343     No results found for: "VD25OH"  Clinical ASCVD: No  The ASCVD Risk score (Arnett DK, et al., 2019) failed to calculate for the following reasons:   The valid total cholesterol range is 130 to 320 mg/dL       06/24/2022    9:13 AM 03/24/2022    9:07 AM 02/26/2022    8:37 AM  Depression screen PHQ 2/9  Decreased Interest 0 0 0  Down, Depressed, Hopeless 0 0 0  PHQ - 2 Score 0 0 0  Altered sleeping '2 1 1  ' Tired, decreased energy '1 1 1  ' Change in appetite '1 1 1  ' Feeling bad or failure about yourself  0 0 0  Trouble concentrating 0 0 0  Moving slowly or fidgety/restless 0 0 0  Suicidal thoughts 0 0 0  PHQ-9 Score '4 3 3  ' Difficult doing work/chores Not difficult at all Not difficult at all Not difficult at all    Social History   Tobacco Use  Smoking Status Never  Smokeless Tobacco Never   BP Readings from Last 3 Encounters:  06/24/22 128/69  03/24/22 (!) 130/56  02/26/22 (!) 123/55   Pulse Readings from Last 3 Encounters:  06/24/22 76  03/24/22 76  02/26/22 72   Wt Readings from Last 3 Encounters:  06/24/22 185 lb (83.9 kg)  03/24/22 186 lb (84.4 kg)  02/26/22 188 lb 8 oz (85.5 kg)   BMI Readings from Last 3 Encounters:  06/24/22 30.79 kg/m  03/24/22 30.95 kg/m  02/26/22 31.37 kg/m    Assessment/Interventions: Review of patient past medical history, allergies, medications, health status, including review of consultants reports, laboratory and other test data, was performed as part of comprehensive evaluation and  provision of chronic care management services.   SDOH:  (Social Determinants of Health) assessments and interventions performed: Yes SDOH Interventions    Flowsheet Row Chronic Care Management from 06/21/2022 in [place] Visit from 07/20/2021 in [place] Interventions    Transportation Interventions Intervention Not Indicated --  Depression Interventions/Treatment  -- HBZ1-6 Score <4 Follow-up Not Indicated  Financial Strain Interventions Other (Comment)  [PAP] --  SDOH Screenings   Transportation Needs: No Transportation Needs (06/22/2022)  Alcohol Screen: Low Risk  (06/24/2022)  Depression (PHQ2-9): Low Risk  (06/24/2022)  Financial Resource Strain: High Risk (06/22/2022)  Tobacco Use: Low Risk  (06/24/2022)    CCM Care Plan  Allergies  Allergen Reactions   Effexor [Venlafaxine] Anaphylaxis   Hylan G-F 20 Other (See Comments)    Myalgias, hand pain, back pain; Benadryl helped pain Other reaction(s): Other Myalgias, hand pain, back pain; Benadryl helped pain Burning sensation all over body   Diazepam     Other reaction(s): Unknown   Tramadol Nausea Only   Methylprednisolone Nausea Only and Other (See Comments)    Headaches and chest pain    Medications Reviewed Today     Reviewed by Gwyneth Sprout, FNP (Family Nurse Practitioner) on 06/24/22 at [phone]  Med List Status: <None>   Medication Order Taking? Sig Documenting Provider Last Dose Status Informant  acetaminophen (TYLENOL) 500 MG tablet [id number] Yes Take 500 mg by mouth 2 (two) times daily. [provider] Taking Active Self  blood glucose meter kit and supplies [id number] Yes Dispense based on patient and insurance preference. To check fasting blood sugar once daily. E1.22 Gwyneth Sprout, FNP Taking Active   cholecalciferol (VITAMIN D3) 25 MCG (1000 UNIT) tablet [id number] Yes Take 1,000 Units by mouth daily. [provider] Taking Active Self   cyclobenzaprine (FLEXERIL) 5 MG tablet [id number] Yes Take 1 tablet (5 mg total) by mouth at bedtime. Gwyneth Sprout, FNP Taking Active   diclofenac Sodium (VOLTAREN) 1 % GEL [id number] Yes Apply 2 g topically daily as needed (pain). [provider] Taking Active Self  diltiazem (CARDIZEM CD) 240 MG 24 hr capsule [id number] Yes Take 1 capsule (240 mg total) by mouth daily. Gwyneth Sprout, FNP Taking Active   docusate sodium (COLACE) 100 MG capsule [id number] Yes Take 1 capsule (100 mg total) by mouth 2 (two) times daily. To keep stools soft  Patient taking differently: Take 100 mg by mouth daily as needed. To keep stools soft   Benjaman Kindler, MD Taking Active   empagliflozin (JARDIANCE) 25 MG TABS tablet [id number] Yes Take 1 tablet (25 mg total) by mouth daily before breakfast. Gwyneth Sprout, FNP Taking Active   gabapentin (NEURONTIN) 100 MG capsule [id number] Yes Take 100 mg by mouth at bedtime. [provider] Taking Active            Med Note Raeford Razor Jun 21, 2022  1:33 PM) Prescribed by Dr. Alba Destine  glucose blood (CONTOUR NEXT TEST) test strip [id number] Yes To check BG BID Gwyneth Sprout, FNP Taking Active   lactulose [facility]) 10 GM/15ML solution [id number] Yes Take 15 mLs (10 g total) by mouth 2 (two) times daily as needed for mild constipation. Gwyneth Sprout, FNP Taking Active   loratadine (CLARITIN) 10 MG tablet [id number] Yes Take 10 mg by mouth daily as needed for allergies. [provider] Taking Active Self  losartan (COZAAR) 100 MG tablet [id number]  Take 1 tablet (100 mg total) by mouth daily. Gwyneth Sprout, FNP  Active   Microlet Lancets MISC [id number] Yes To check BG BID Mar Daring, PA-C Taking Active Self  montelukast (SINGULAIR) 10 MG tablet [id number] Yes Take 1 tablet (10 mg total) by mouth at bedtime. Gwyneth Sprout, FNP Taking Active   Multiple Vitamin (MULTIVITAMIN) tablet [id number] Yes Take 1 tablet by mouth daily. Women's  50+ [provider]  Taking Active Self  Polyethyl Glycol-Propyl Glycol (SYSTANE OP) [id number] Yes Place 1 drop into both eyes daily as needed (dry/irritated eyes). [provider] Taking Active Self  rosuvastatin (CRESTOR) 5 MG tablet [id number]  Take 1 tablet (5 mg total) by mouth daily. [name] T, FNP  Active             Patient Active Problem List   Diagnosis Date Noted   Diabetes mellitus ([place]) 06/24/2022   Type 2 diabetes mellitus with stage 3a chronic kidney disease, without long-term current use of insulin ([place]) 06/24/2022   Need for influenza vaccination 06/24/2022   Annual physical exam 03/24/2022   Bronchospasm, acute 02/26/2022   Snoring 09/25/2021   Psychophysiological insomnia 09/25/2021   Seasonal allergic rhinitis due to pollen 09/25/2021   Pelvic organ prolapse quantification stage 2 cystocele 02/02/2021   Lumbar radiculopathy 07/21/2020   T2DM (type 2 diabetes mellitus) ([place]) 01/03/2019   Hyperlipidemia associated with type 2 diabetes mellitus ([place]) 06/20/2018   Primary osteoarthritis of both knees 02/16/2017   Impingement syndrome of shoulder 07/02/2015   Complete rotator cuff rupture of left shoulder 07/02/2015   Cervical radiculitis 07/02/2015   Impingement syndrome of both shoulders 07/02/2015   Arthritis sicca 04/09/2015   Hypertension associated with diabetes ([place]) 04/09/2015   H/O eczema 04/09/2015   Hyperthyroidism 04/09/2015   Arthritis 04/09/2015    Immunization History  Administered Date(s) Administered   Fluad Quad(high Dose 65+) 06/24/2022   Influenza Inj Mdck Quad Pf 09/19/2018   Influenza,inj,Quad PF,6+ Mos 07/03/2014, 06/29/2019   Influenza-Unspecified 06/27/2020, 06/26/2021   Moderna Sars-Covid-2 Vaccination 12/13/2019, 01/10/2020   Tdap 06/29/2019   Zoster, Live 11/08/2011    Conditions to be addressed/monitored:  Hypertension, Hyperlipidemia, Diabetes, and Osteoarthritis  Care Plan : General Pharmacy (Adult)   Updates made by Germaine Pomfret, [place] since 06/25/2022 12:00 AM     Problem: Hypertension, Hyperlipidemia, Diabetes, and Osteoarthritis   Priority: High     Long-Range Goal: Patient-Specific Goal   Start Date: 06/22/2022  Expected End Date: 06/23/2023  This Visit's Progress: On track  Recent Progress: On track  Priority: High  Note:   Current Barriers:  Unable to independently afford treatment regimen Unable to achieve control of Diabetes, Constipation   Pharmacist Clinical Goal(s):  Patient will achieve control of diabetes as evidenced by A1c less than 7% through collaboration with PharmD and provider.   Interventions: 1:1 collaboration with Gwyneth Sprout, FNP regarding development and update of comprehensive plan of care as evidenced by provider attestation and co-signature Inter-disciplinary care team collaboration (see longitudinal plan of care) Comprehensive medication review performed; medication list updated in electronic medical record  Hypertension (BP goal <130/80) -Controlled -Current treatment: Diltiazem CD 240 mg daily  Losartan 100 mg daily  -Medications previously tried: Amlodipine, Metoprolol, Propanolol, Verapamil   -Current home readings: 117-120s/60s.  -Current dietary habits: Does add salt in her cooking. -Denies hypotensive/hypertensive symptoms -Recommended to continue current medication  Hyperlipidemia: (LDL goal < 70) -Controlled -Current treatment: Rosuvastatin 5 mg daily  -Medications previously tried: NA  -Recommended to continue current medication  Diabetes (A1c goal <7%) -Uncontrolled -Current medications: None  -Medications previously tried: Glipizide (Constipation), Jardiance (Stomach ache, diarrhea, skin burning)  -Hesitant about injectable medications.  -Current home glucose readings fasting glucose: 172, 142, 164, 145, 150, 133, 147 -Denies hypoglycemic/hyperglycemic symptoms -Current meal patterns: Trying to eat healthier,  smaller portions overall.  snacks: Activia Yogurt drinks: Decaf coffee or herbal tea most mornings. Drinks 6-8 bottles of water.  -  Current exercise: Walking most days, [employer] aerobics less these days due to [place].  -Did not tolerate Jardiance. We discussed starting metformin or Rybelsus, patient hesitant due to her ongoing diarrhea. -STOP Jardiance  -1-week wash out period, then will decide on starting metformin or Rybelsus if patient amenable.    Constipation (Goal: Improve symptoms) -Uncontrolled -Current treatment  Docusate 100 mg as needed Takes 1-2 times weekly  Lactulose as needed  -Medications previously tried: Miralax (racing heart) -Rarely uses lactulose due to complaints of cramping, flatulence.  -Acitiva Yogurt daily  -Patient continues to struggle with constipation. Counseled patient on adequate water intake, increasing dietary fiber intake.  -INCREASE docusate to once daily rather than PRN.  -MODIFY Lactulose use to weekends when she does not need to work.   Patient Goals/Self-Care Activities Patient will:  - check glucose daily before breakfast, document, and provide at future appointments check blood pressure weekly, document, and provide at future appointments  Follow Up Plan: Telephone follow up appointment with care management team member scheduled for:  07/20/2022 at 8:30 AM    Medication Assistance: Application for Jardiance  medication assistance program. in process.  Anticipated assistance start date TBD.  See plan of care for additional detail.  Compliance/Adherence/Medication fill history: Care Gaps: Zoster Vaccines Influenza Vaccine A1C > 9  Star-Rating Drugs: Glipizide 10 mg last filled on 03/24/2022 for a 90-Day supply with [place] Losartan 100 mg last filled on 04/09/2022 for a 90-Day supply with [place] Metformin 500 mg last filled on 05/12/2022 for a 90-Day supply with [place] Rosuvastatin 5 mg last filled on 04/09/2022  for a 90-Day supply with [place]  Patient's preferred pharmacy is:  [facility] [address] [place] [address] Phone: [phone] Fax: [phone]  Uses pill box? Yes Pt endorses 100% compliance  We discussed: Current pharmacy is preferred with insurance plan and patient is satisfied with pharmacy services Patient decided to: Continue current medication management strategy  Care Plan and Follow Up Patient Decision:  Patient agrees to Care Plan and Follow-up.  Plan: Telephone follow up appointment with care management team member scheduled for:  07/20/2022 at 8:30 AM  [name], PharmD, [place], [place] [phone]

## 2022-06-25 NOTE — Patient Instructions (Signed)
Visit Information It was great speaking with you today!  Please let me know if you have any questions about our visit.  Patient Care Plan: General Pharmacy (Adult)     Problem Identified: Hypertension, Hyperlipidemia, Diabetes, and Osteoarthritis   Priority: High     Long-Range Goal: Patient-Specific Goal   Start Date: 06/22/2022  Expected End Date: 06/23/2023  This Visit's Progress: On track  Recent Progress: On track  Priority: High  Note:   Current Barriers:  Unable to independently afford treatment regimen Unable to achieve control of Diabetes, Constipation   Pharmacist Clinical Goal(s):  Patient will achieve control of diabetes as evidenced by A1c less than 7% through collaboration with PharmD and provider.   Interventions: 1:1 collaboration with Gwyneth Sprout, FNP regarding development and update of comprehensive plan of care as evidenced by provider attestation and co-signature Inter-disciplinary care team collaboration (see longitudinal plan of care) Comprehensive medication review performed; medication list updated in electronic medical record  Hypertension (BP goal <130/80) -Controlled -Current treatment: Diltiazem CD 240 mg daily  Losartan 100 mg daily  -Medications previously tried: Amlodipine, Metoprolol, Propanolol, Verapamil   -Current home readings: 117-120s/60s.  -Current dietary habits: Does add salt in her cooking. -Denies hypotensive/hypertensive symptoms -Recommended to continue current medication  Hyperlipidemia: (LDL goal < 70) -Controlled -Current treatment: Rosuvastatin 5 mg daily  -Medications previously tried: NA  -Recommended to continue current medication  Diabetes (A1c goal <7%) -Uncontrolled -Current medications: None  -Medications previously tried: Glipizide (Constipation), Jardiance (Stomach ache, diarrhea, skin burning)  -Hesitant about injectable medications.  -Current home glucose readings fasting glucose: 172, 142, 164, 145,  150, 133, 147 -Denies hypoglycemic/hyperglycemic symptoms -Current meal patterns: Trying to eat healthier, smaller portions overall.  snacks: Activia Yogurt drinks: Decaf coffee or herbal tea most mornings. Drinks 6-8 bottles of water.  -Current exercise: Walking most days, [employer] aer[employer] less these days due to [place].  -Did not tolerate Jardiance. We discussed starting metformin or Rybelsus, patient hesitant due to her ongoing diarrhea. -STOP Jardiance  -1-week wash out period, then will decide on starting metformin or Rybelsus if patient amenable.    Constipation (Goal: Improve symptoms) -Uncontrolled -Current treatment  Docusate 100 mg as needed Takes 1-2 times weekly  Lactulose as needed  -Medications previously tried: Miralax (racing heart) -Rarely uses lactulose due to complaints of cramping, flatulence.  -Acitiva Yogurt daily  -Patient continues to struggle with constipation. Counseled patient on adequate water intake, increasing dietary fiber intake.  -INCREASE docusate to once daily rather than PRN.  -MODIFY Lactulose use to weekends when she does not need to work.   Patient Goals/Self-Care Activities Patient will:  - check glucose daily before breakfast, document, and provide at future appointments check blood pressure weekly, document, and provide at future appointments  Follow Up Plan: Telephone follow up appointment with care management team member scheduled for:  07/20/2022 at 8:30 AM    Patient agreed to services and verbal consent obtained.   Patient verbalizes understanding of instructions and care plan provided today and agrees to view in [place]. Active MyChart status and patient understanding of how to access instructions and care plan via MyChart confirmed with patient.     [name], PharmD, [place], CPP  Clinical Pharmacist Practitioner  [facility] [phone]

## 2022-06-25 NOTE — Progress Notes (Signed)
A1c is slowly improving; now 8.5%. Goal is 8% or less. Continue to recommend balanced, lower carb meals. Smaller meal size, adding snacks. Choosing water as drink of choice and increasing purposeful exercise. Alex and his team will reach out about new medication given side effects with Jardiance.   All other labs look great.  Gwyneth Sprout, [address] [place], [place] [address] [phone] (phone) [phone] (fax) [place]

## 2022-06-28 ENCOUNTER — Telehealth: Payer: Self-pay

## 2022-06-28 NOTE — Progress Notes (Signed)
    Chronic Care Management Pharmacy Assistant   Name: Molly Potter  MRN: [id number] DOB: [birth year]  Reason for Encounter: DM Medication Management   [name], CPP sent me a task requesting that I contact the patient to inquire if she would like to start Metformin 500 mg daily or Rybelsus 3 mg daily.   Per the patient she doesn't want to take Metformin at all, and she will try to Rybelsus if there is a sample to provide. She stated that she is leaving and going out of town next week and isn't willing to start any new medications until her return on 07/13/2022.  CPP notified   Medications: Outpatient Encounter Medications as of 06/28/2022  Medication Sig Note   acetaminophen (TYLENOL) 500 MG tablet Take 500 mg by mouth 2 (two) times daily.    blood glucose meter kit and supplies Dispense based on patient and insurance preference. To check fasting blood sugar once daily. E1.22    cholecalciferol (VITAMIN D3) 25 MCG (1000 UNIT) tablet Take 1,000 Units by mouth daily.    cyclobenzaprine (FLEXERIL) 5 MG tablet Take 1 tablet (5 mg total) by mouth at bedtime.    diclofenac Sodium (VOLTAREN) 1 % GEL Apply 2 g topically daily as needed (pain).    diltiazem (CARDIZEM CD) 240 MG 24 hr capsule Take 1 capsule (240 mg total) by mouth daily.    docusate sodium (COLACE) 100 MG capsule Take 1 capsule (100 mg total) by mouth 2 (two) times daily. To keep stools soft (Patient taking differently: Take 100 mg by mouth daily as needed. To keep stools soft)    empagliflozin (JARDIANCE) 25 MG TABS tablet Take 1 tablet (25 mg total) by mouth daily before breakfast.    gabapentin (NEURONTIN) 100 MG capsule Take 100 mg by mouth at bedtime. 06/21/2022: Prescribed by Dr. Alba Destine   glucose blood (CONTOUR NEXT TEST) test strip To check BG BID    lactulose (CHRONULAC) 10 GM/15ML solution Take 15 mLs (10 g total) by mouth 2 (two) times daily as needed for mild constipation.    loratadine (CLARITIN) 10 MG tablet Take  10 mg by mouth daily as needed for allergies.    losartan (COZAAR) 100 MG tablet Take 1 tablet (100 mg total) by mouth daily.    Microlet Lancets MISC To check BG BID    montelukast (SINGULAIR) 10 MG tablet Take 1 tablet (10 mg total) by mouth at bedtime.    Multiple Vitamin (MULTIVITAMIN) tablet Take 1 tablet by mouth daily. Women's 50+    Polyethyl Glycol-Propyl Glycol (SYSTANE OP) Place 1 drop into both eyes daily as needed (dry/irritated eyes).    rosuvastatin (CRESTOR) 5 MG tablet Take 1 tablet (5 mg total) by mouth daily.    No facility-administered encounter medications on file as of 06/28/2022.    Lynann Bologna, CPA/CMA Clinical Pharmacist Assistant Phone: [phone]

## 2022-07-07 NOTE — Therapy (Signed)
No charge, encounter entered in error.  Geoffry Paradise, PT,DPT 07/07/22 12:17 PM Phone: [phone] Fax: [phone]

## 2022-07-13 DIAGNOSIS — E1159 Type 2 diabetes mellitus with other circulatory complications: Secondary | ICD-10-CM

## 2022-07-13 DIAGNOSIS — E785 Hyperlipidemia, unspecified: Secondary | ICD-10-CM | POA: Diagnosis not present

## 2022-07-13 DIAGNOSIS — I10 Essential (primary) hypertension: Secondary | ICD-10-CM

## 2022-07-19 ENCOUNTER — Telehealth: Payer: Self-pay

## 2022-07-19 NOTE — Progress Notes (Signed)
    Chronic Care Management Pharmacy Assistant   Name: Molly Potter  MRN: [id number] DOB: 16-Sep-1955  Reason for Encounter: DM Medication Follow-up/Appointment Reminder   I spoke with the patient, and she reports she is doing well. Per patient after thinking about it she is willing to try Rybelsus 3 mg tablet. Patient would like samples prior to a prescription to make sure she does not have any major side effects.  I informed patient that I would let CPP know of her decision, and he would speak with her about it more tomorrow at her telephone appointment. Patient verbalized understanding.  Medications: Outpatient Encounter Medications as of 07/19/2022  Medication Sig Note   acetaminophen (TYLENOL) 500 MG tablet Take 500 mg by mouth 2 (two) times daily.    blood glucose meter kit and supplies Dispense based on patient and insurance preference. To check fasting blood sugar once daily. E1.22    cholecalciferol (VITAMIN D3) 25 MCG (1000 UNIT) tablet Take 1,000 Units by mouth daily.    cyclobenzaprine (FLEXERIL) 5 MG tablet Take 1 tablet (5 mg total) by mouth at bedtime.    diclofenac Sodium (VOLTAREN) 1 % GEL Apply 2 g topically daily as needed (pain).    diltiazem (CARDIZEM CD) 240 MG 24 hr capsule Take 1 capsule (240 mg total) by mouth daily.    docusate sodium (COLACE) 100 MG capsule Take 1 capsule (100 mg total) by mouth 2 (two) times daily. To keep stools soft (Patient taking differently: Take 100 mg by mouth daily as needed. To keep stools soft)    empagliflozin (JARDIANCE) 25 MG TABS tablet Take 1 tablet (25 mg total) by mouth daily before breakfast.    gabapentin (NEURONTIN) 100 MG capsule Take 100 mg by mouth at bedtime. 06/21/2022: Prescribed by Dr. Alba Destine   glucose blood (CONTOUR NEXT TEST) test strip To check BG BID    lactulose (CHRONULAC) 10 GM/15ML solution Take 15 mLs (10 g total) by mouth 2 (two) times daily as needed for mild constipation.    loratadine (CLARITIN) 10 MG  tablet Take 10 mg by mouth daily as needed for allergies.    losartan (COZAAR) 100 MG tablet Take 1 tablet (100 mg total) by mouth daily.    Microlet Lancets MISC To check BG BID    montelukast (SINGULAIR) 10 MG tablet Take 1 tablet (10 mg total) by mouth at bedtime.    Multiple Vitamin (MULTIVITAMIN) tablet Take 1 tablet by mouth daily. Women's 50+    Polyethyl Glycol-Propyl Glycol (SYSTANE OP) Place 1 drop into both eyes daily as needed (dry/irritated eyes).    rosuvastatin (CRESTOR) 5 MG tablet Take 1 tablet (5 mg total) by mouth daily.    No facility-administered encounter medications on file as of 07/19/2022.    Lynann Bologna, CPA/CMA Clinical Pharmacist Assistant Phone: [phone]

## 2022-07-20 ENCOUNTER — Ambulatory Visit: Payer: PPO

## 2022-07-20 DIAGNOSIS — K5903 Drug induced constipation: Secondary | ICD-10-CM

## 2022-07-20 DIAGNOSIS — E1169 Type 2 diabetes mellitus with other specified complication: Secondary | ICD-10-CM

## 2022-07-20 MED ORDER — RYBELSUS 3 MG PO TABS: 3.0000 mg | ORAL_TABLET | Freq: Every day | ORAL | 0 refills | Status: DC

## 2022-07-20 NOTE — Progress Notes (Signed)
Chronic Care Management Pharmacy Note  08/12/2022 Name:  TOCCARA ALFORD MRN:  [id number] DOB:  Nov 13, 1955  Summary: Patient presents for CCM follow-up.   Recommendations/Changes made from today's visit: START Rybelsus 3 mg daily before breakfast   Plan: CPP follow-up 1 month.   Recommended Problem List Changes:  Add: Chronic idiopathic constipation [K59.04]  Subjective: Jerrilynn B Parish is an 66 y.o. year old female who is a primary patient of Gwyneth Sprout, FNP.  The CCM team was consulted for assistance with disease management and care coordination needs.    Engaged with patient by telephone for follow up visit in response to provider referral for pharmacy case management and/or care coordination services.   Consent to Services:  The patient was given information about Chronic Care Management services, agreed to services, and gave verbal consent prior to initiation of services.  Please see initial visit note for detailed documentation.   Patient Care Team: Gwyneth Sprout, FNP as PCP - General (Family Medicine) Germaine Pomfret, [facility] (Pharmacist)  Recent office visits: 03/24/2022 [name], [place] (PCP Office Visit) for Annual Physical Exam- Started: Glipizide 10 mg daily with lunch, Lab orders placed, DM Eye Exam order placed, Patient to follow-up in 3 months   02/26/2022 [name], [place] (PCP Office Visit) for Cough- Started: Benzonatate 200 mg daily prn, Fluticasone-Umeclidin-Vilant 100-62.5-25 mcg 1 puff daily, No orders placed,    12/24/2021 [name], FNP (PCP Office Visit) for HTN/DM- Changed: Increased Losartan Potassium to 100 mg daily, Lab orders placed, Patient to follow-up in 3 months  Recent consult visits: 05/11/2022 04/20/2022 [name], MD (Phys Med) for Follow-up- No medication changes noted, No orders placed, Patient to follow-up prn   04/20/2022 [name], MD (Phys Med) for Pain-Started: Gabapentin 100 mg to be slowly titrated up to 3  times a day, patient to follow-up in 2 to 3 weeks   04/07/2022 Geoffry Paradise, PT (Rehab Services) for PT Treatment  03/31/2022 Geoffry Paradise, PT (Rehab Services) for PT Treatment  03/10/2022 Geoffry Paradise, PT (Rehab Services) for PT Treatment 03/03/2022 Geoffry Paradise, PT (Rehab Services) for PT Treatment  02/22/2022 Geoffry Paradise, PT (Rehab Services) for PT Treatment  02/01/2022 Geoffry Paradise, PT (Rehab Services) for [facility] visits: None in previous 6 months   Objective:  Lab Results  Component Value Date   CREATININE 0.81 06/24/2022   BUN 15 06/24/2022   EGFR 81 06/24/2022   GFRNONAA >60 02/03/2021   GFRAA 85 07/23/2020   NA 142 06/24/2022   K 4.2 06/24/2022   CALCIUM 9.3 06/24/2022   CO2 22 06/24/2022   GLUCOSE 141 (H) 06/24/2022    Lab Results  Component Value Date/Time   HGBA1C 8.5 (H) 06/24/2022 09:33 AM   HGBA1C 9.3 (A) 03/24/2022 09:12 AM   HGBA1C 9.0 (A) 12/24/2021 09:02 AM   HGBA1C 8.4 (H) 07/20/2021 09:35 AM   MICROALBUR negative 07/03/2018 04:43 PM    Last diabetic Eye exam:  Lab Results  Component Value Date/Time   HMDIABEYEEXA No Retinopathy 03/18/2022 12:00 AM    Last diabetic Foot exam: No results found for: "HMDIABFOOTEX"   Lab Results  Component Value Date   CHOL 124 06/24/2022   HDL 43 06/24/2022   LDLCALC 61 06/24/2022   TRIG 111 06/24/2022   CHOLHDL 2.9 06/24/2022       Latest Ref Rng & Units 06/24/2022    9:33 AM 07/20/2021    9:35 AM 01/19/2021    3:34 PM  Hepatic  Function  Total Protein 6.0 - 8.5 g/dL 7.1  7.0  6.9   Albumin 3.9 - 4.9 g/dL 4.4  4.6  4.3   AST 0 - 40 IU/L _0 ALT 0 - 32 IU/L _1 Alk Phosphatase 44 - 121 IU/L 89  100  89   Total Bilirubin 0.0 - 1.2 mg/dL 0.2  0.3  <0.2     Lab Results  Component Value Date/Time   TSH 1.940 07/20/2021 09:35 AM   TSH 2.240 01/19/2021 03:34 PM   FREET4 1.10 05/28/2015 12:44 PM       Latest Ref Rng & Units 06/24/2022    9:33 AM  07/20/2021    9:35 AM 02/03/2021    5:09 AM  CBC  WBC 3.4 - 10.8 x10E3/uL 9.1  9.0  16.0   Hemoglobin 11.1 - 15.9 g/dL 13.5  13.7  12.2   Hematocrit 34.0 - 46.6 % 39.2  39.6  36.4   Platelets 150 - 450 x10E3/uL 413  399  343     No results found for: "VD25OH"  Clinical ASCVD: No  The ASCVD Risk score (Arnett DK, et al., 2019) failed to calculate for the following reasons:   The valid total cholesterol range is 130 to 320 mg/dL       08/10/2022    2:04 PM 06/24/2022    9:13 AM 03/24/2022    9:07 AM  Depression screen PHQ 2/9  Decreased Interest 0 0 0  Down, Depressed, Hopeless 0 0 0  PHQ - 2 Score 0 0 0  Altered sleeping 0 2 1  Tired, decreased energy 0 1 1  Change in appetite 0 1 1  Feeling bad or failure about yourself  0 0 0  Trouble concentrating 0 0 0  Moving slowly or fidgety/restless 0 0 0  Suicidal thoughts 0 0 0  PHQ-9 Score 0 4 3  Difficult doing work/chores Not difficult at all Not difficult at all Not difficult at all    Social History   Tobacco Use  Smoking Status Never  Smokeless Tobacco Never   BP Readings from Last 3 Encounters:  08/10/22 137/76  06/24/22 128/69  03/24/22 (!) 130/56   Pulse Readings from Last 3 Encounters:  08/10/22 85  06/24/22 76  03/24/22 76   Wt Readings from Last 3 Encounters:  08/10/22 179 lb (81.2 kg)  08/10/22 179 lb (81.2 kg)  06/24/22 185 lb (83.9 kg)   BMI Readings from Last 3 Encounters:  08/10/22 29.79 kg/m  08/10/22 29.79 kg/m  06/24/22 30.79 kg/m    Assessment/Interventions: Review of patient past medical history, allergies, medications, health status, including review of consultants reports, laboratory and other test data, was performed as part of comprehensive evaluation and provision of chronic care management services.   SDOH:  (Social Determinants of Health) assessments and interventions performed: Yes SDOH Interventions    Flowsheet Row Chronic Care Management from 06/21/2022 in [place] Visit from 07/20/2021 in [place] Interventions    Transportation Interventions Intervention Not Indicated --  Depression Interventions/Treatment  -- OEU2-3 Score <4 Follow-up Not Indicated  Financial Strain Interventions Other (Comment)  [PAP] --      SDOH Screenings   Food Insecurity: No Food Insecurity (08/10/2022)  Housing: Low Risk  (08/10/2022)  Transportation Needs: No Transportation Needs (08/10/2022)  Utilities: Not At Risk (08/10/2022)  Alcohol Screen: Low Risk  (08/10/2022)  Depression (PHQ2-9):  Low Risk  (08/10/2022)  Financial Resource Strain: Low Risk  (08/10/2022)  Recent Concern: Financial Resource Strain - High Risk (06/22/2022)  Physical Activity: Insufficiently Active (08/10/2022)  Social Connections: Moderately Integrated (08/10/2022)  Stress: No Stress Concern Present (08/10/2022)  Tobacco Use: Low Risk  (08/10/2022)    CCM Care Plan  Allergies  Allergen Reactions   Effexor [Venlafaxine] Anaphylaxis   Hylan G-F 20 Other (See Comments)    Myalgias, hand pain, back pain; Benadryl helped pain Other reaction(s): Other Myalgias, hand pain, back pain; Benadryl helped pain Burning sensation all over body   Diazepam     Other reaction(s): Unknown   Jardiance [Empagliflozin] Nausea Only   Tramadol Nausea Only   Methylprednisolone Nausea Only and Other (See Comments)    Headaches and chest pain    Medications Reviewed Today     Reviewed by Dionisio David, LPN (Licensed Practical Nurse) on 08/10/22 at [place] List Status: <None>   Medication Order Taking? Sig Documenting Provider Last Dose Status Informant  acetaminophen (TYLENOL) 500 MG tablet [id number] Yes Take 500 mg by mouth 2 (two) times daily. [provider] Taking Active Self  blood glucose meter kit and supplies [id number] Yes Dispense based on patient and insurance preference. To check fasting blood sugar once daily. E1.22 Gwyneth Sprout, FNP Taking  Active   cephALEXin (KEFLEX) 500 MG capsule [id number] Yes Take 1 capsule (500 mg total) by mouth 4 (four) times daily for 5 days. Myles Gip, DO Taking Active   cholecalciferol (VITAMIN D3) 25 MCG (1000 UNIT) tablet [id number] Yes Take 1,000 Units by mouth daily. [provider] Taking Active Self  cyclobenzaprine (FLEXERIL) 5 MG tablet [id number] Yes Take 1 tablet (5 mg total) by mouth at bedtime. Gwyneth Sprout, FNP Taking Active   diclofenac Sodium (VOLTAREN) 1 % GEL [id number] Yes Apply 2 g topically daily as needed (pain). [provider] Taking Active Self  diltiazem (CARDIZEM CD) 240 MG 24 hr capsule [id number] Yes Take 1 capsule (240 mg total) by mouth daily. Gwyneth Sprout, FNP Taking Active   docusate sodium (COLACE) 100 MG capsule [id number] Yes Take 1 capsule (100 mg total) by mouth 2 (two) times daily. To keep stools soft  Patient taking differently: Take 100 mg by mouth daily as needed. To keep stools soft   Benjaman Kindler, MD Taking Active   gabapentin (NEURONTIN) 100 MG capsule [id number] Yes Take 100 mg by mouth at bedtime. [provider] Taking Active            Med Note Raeford Razor Jun 21, 2022  1:33 PM) Prescribed by Dr. Alba Destine  glucose blood (CONTOUR NEXT TEST) test strip [id number] Yes To check BG BID Gwyneth Sprout, FNP Taking Active   lactulose [facility]) 10 GM/15ML solution [id number] Yes Take 15 mLs (10 g total) by mouth 2 (two) times daily as needed for mild constipation. Gwyneth Sprout, FNP Taking Active   Lancets [facility] Eva PLUS [id number]) MISC [id number] Yes USE TO TEST BLOOD SUGAR ONCE DAILY Gwyneth Sprout, FNP Taking Active   loratadine (CLARITIN) 10 MG tablet [id number] Yes Take 10 mg by mouth daily as needed for allergies. [provider] Taking Active Self  losartan (COZAAR) 100 MG tablet [id number] Yes Take 1 tablet (100 mg total) by mouth daily. Gwyneth Sprout, FNP Taking Active   montelukast (SINGULAIR) 10  MG tablet [id number] Yes Take 1 tablet (10 mg total) by mouth at bedtime.  Gwyneth Sprout, FNP Taking Active   Multiple Vitamin (MULTIVITAMIN) tablet [id number] Yes Take 1 tablet by mouth daily. Women's 50+ [provider] Taking Active Self  Polyethyl Glycol-Propyl Glycol (SYSTANE OP) [id number] Yes Place 1 drop into both eyes daily as needed (dry/irritated eyes). [provider] Taking Active Self  rosuvastatin (CRESTOR) 5 MG tablet [id number]  Take 1 tablet (5 mg total) by mouth daily. [name] T, FNP  Active             Patient Active Problem List   Diagnosis Date Noted   Diabetes mellitus ([place]) 06/24/2022   Type 2 diabetes mellitus with stage 3a chronic kidney disease, without long-term current use of insulin ([place]) 06/24/2022   Need for influenza vaccination 06/24/2022   Annual physical exam 03/24/2022   Bronchospasm, acute 02/26/2022   Snoring 09/25/2021   Psychophysiological insomnia 09/25/2021   Seasonal allergic rhinitis due to pollen 09/25/2021   Pelvic organ prolapse quantification stage 2 cystocele 02/02/2021   Lumbar radiculopathy 07/21/2020   T2DM (type 2 diabetes mellitus) ([place]) 01/03/2019   Hyperlipidemia associated with type 2 diabetes mellitus ([place]) 06/20/2018   Primary osteoarthritis of both knees 02/16/2017   Impingement syndrome of shoulder 07/02/2015   Complete rotator cuff rupture of left shoulder 07/02/2015   Cervical radiculitis 07/02/2015   Impingement syndrome of both shoulders 07/02/2015   Arthritis sicca 04/09/2015   Hypertension associated with diabetes ([place]) 04/09/2015   H/O eczema 04/09/2015   Hyperthyroidism 04/09/2015   Arthritis 04/09/2015    Immunization History  Administered Date(s) Administered   Fluad Quad(high Dose 65+) 06/24/2022   Influenza Inj Mdck Quad Pf 09/19/2018   Influenza,inj,Quad PF,6+ Mos 07/03/2014, 06/29/2019   Influenza-Unspecified 06/27/2020, 06/26/2021   Moderna Sars-Covid-2 Vaccination 12/13/2019,  01/10/2020   Tdap 06/29/2019   Zoster, Live 11/08/2011    Conditions to be addressed/monitored:  Hypertension, Hyperlipidemia, Diabetes, and Osteoarthritis  Care Plan : General Pharmacy (Adult)  Updates made by Germaine Pomfret, [place] since 08/12/2022 12:00 AM     Problem: Hypertension, Hyperlipidemia, Diabetes, and Osteoarthritis   Priority: High     Long-Range Goal: Patient-Specific Goal   Start Date: 06/22/2022  Expected End Date: 06/23/2023  This Visit's Progress: On track  Recent Progress: On track  Priority: High  Note:   Current Barriers:  Unable to independently afford treatment regimen Unable to achieve control of Diabetes, Constipation   Pharmacist Clinical Goal(s):  Patient will achieve control of diabetes as evidenced by A1c less than 7% through collaboration with PharmD and provider.   Interventions: 1:1 collaboration with Gwyneth Sprout, FNP regarding development and update of comprehensive plan of care as evidenced by provider attestation and co-signature Inter-disciplinary care team collaboration (see longitudinal plan of care) Comprehensive medication review performed; medication list updated in electronic medical record  Hypertension (BP goal <130/80) -Controlled -Current treatment: Diltiazem CD 240 mg daily  Losartan 100 mg daily  -Medications previously tried: Amlodipine, Metoprolol, Propanolol, Verapamil   -Current home readings: 117-120s/60s.  -Current dietary habits: Does add salt in her cooking. -Denies hypotensive/hypertensive symptoms -Recommended to continue current medication  Hyperlipidemia: (LDL goal < 70) -Controlled -Current treatment: Rosuvastatin 5 mg daily  -Medications previously tried: NA  -Recommended to continue current medication  Diabetes (A1c goal <7%) -Uncontrolled -Current medications: None  -Medications previously tried: Glipizide (Constipation), Jardiance (Stomach ache, diarrhea, skin burning)  -Hesitant about  injectable medications.  -Current home glucose readings fasting glucose:  -Denies hypoglycemic/hyperglycemic symptoms -Current meal patterns: Trying to eat healthier, smaller  portions overall.  snacks: Activia Yogurt drinks: Decaf coffee or herbal tea most mornings. Drinks 6-8 bottles of water.  -Current exercise: Walking most days, [employer] aer[employer]ics less these days due to [place].  -Did not tolerate Jardiance. We discussed starting metformin or Rybelsus, patient hesitant due to her ongoing diarrhea. -STOP Jardiance  -1-week wash out period, then will decide on starting metformin or Rybelsus if patient amenable.    Constipation (Goal: Improve symptoms) -Uncontrolled -Current treatment  Docusate 100 mg as needed Takes 1-2 times weekly  Lactulose as needed  -Medications previously tried: Miralax (racing heart) -Rarely uses lactulose due to complaints of cramping, flatulence.  -Acitiva Yogurt daily  -Patient continues to struggle with constipation. Counseled patient on adequate water intake, increasing dietary fiber intake.  -INCREASE docusate to once daily rather than PRN.  -MODIFY Lactulose use to weekends when she does not need to work.   Patient Goals/Self-Care Activities Patient will:  - check glucose daily before breakfast, document, and provide at future appointments check blood pressure weekly, document, and provide at future appointments  Follow Up Plan: Telephone follow up appointment with care management team member scheduled for:  09/20/2022 at 1:00 PM     Medication Assistance: Application for Jardiance  medication assistance program. in process.  Anticipated assistance start date TBD.  See plan of care for additional detail.  Compliance/Adherence/Medication fill history: Care Gaps: Zoster Vaccines Influenza Vaccine A1C > 9  Star-Rating Drugs: Glipizide 10 mg last filled on 03/24/2022 for a 90-Day supply with [place] Losartan 100 mg last filled on 04/09/2022 for  a 90-Day supply with [place] Metformin 500 mg last filled on 05/12/2022 for a 90-Day supply with [place] Rosuvastatin 5 mg last filled on 04/09/2022 for a 90-Day supply with [place]  Patient's preferred pharmacy is:  [facility] [address], [place] [address] Phone: [phone] Fax: [phone]  Uses pill box? Yes Pt endorses 100% compliance  We discussed: Current pharmacy is preferred with insurance plan and patient is satisfied with pharmacy services Patient decided to: Continue current medication management strategy  Care Plan and Follow Up Patient Decision:  Patient agrees to Care Plan and Follow-up.  Plan: Telephone follow up appointment with care management team member scheduled for:  09/20/2022 at 1:00 PM  [name], PharmD, [place], [place] [phone]

## 2022-07-21 ENCOUNTER — Other Ambulatory Visit: Payer: Self-pay | Admitting: Family Medicine

## 2022-07-21 DIAGNOSIS — N183 Type 2 diabetes mellitus with diabetic chronic kidney disease: Secondary | ICD-10-CM

## 2022-07-23 ENCOUNTER — Telehealth: Payer: Self-pay

## 2022-07-23 DIAGNOSIS — E1169 Type 2 diabetes mellitus with other specified complication: Secondary | ICD-10-CM

## 2022-07-23 NOTE — Telephone Encounter (Signed)
-----   Message from Gwyneth Sprout, FNP sent at 07/21/2022  4:51 PM EST ----- Regarding: FW: [id number]  - Referral to continue CCM services OK for orders ----- Message ----- From: Neldon Labella, RN Sent: 07/20/2022   1:05 PM EST To: Gwyneth Sprout, FNP Subject: [id number]  - Referral to continue CCM services   Good [name], Molly Potter is currently being followed by the CCM team and requires a new referral. She is currently being followed for:  Chronic Condition 1: HTN Chronic Condition 2: HLD Chronic Condition 3: DM  We can continue with the following goals: Goal: CCM (HYPERTENSION)  EXPECTED OUTCOME:  MONITOR,SELF- MANAGE AND REDUCE SYMPTOMS OF HYPERTENSION  Goal: CCM (HYPERLIPIDEMIA) EXPECTED OUTCOME:  MONITOR, SELF- MANAGE AND REDUCE SYMPTOMS OF HYPERLIPIDEMIA  Goal: CCM (DIABETES) EXPECTED OUTCOME: MONITOR, SELF-MANAGE AND REDUCE SYMPTOMS OF DIABETES  Thank You!

## 2022-07-26 ENCOUNTER — Telehealth: Payer: Self-pay

## 2022-07-26 NOTE — Progress Notes (Signed)
    Chronic Care Management Pharmacy Assistant   Name: Molly Potter  MRN: [id number] DOB: [birth year]  Reason for Encounter: DM Medication Follow-up Rybelsus   Patient contacted me to advise that she started the Rybelsus on Saturday, and was having some nausea after taking. Per patient the nausea did subside after she ate.  The patient was awaken out of her sleep this morning to abdominal pain around her bladder area. After reading the side effects of the Rybelsus patient has decided to discontinue the medication. Per patient she doesn't like the side effects, and she did not take the medication today. Her abdominal pain is getting better since not taking this medication.  CPP has been notified.  Medications: Outpatient Encounter Medications as of 07/26/2022  Medication Sig Note   acetaminophen (TYLENOL) 500 MG tablet Take 500 mg by mouth 2 (two) times daily.    blood glucose meter kit and supplies Dispense based on patient and insurance preference. To check fasting blood sugar once daily. E1.22    cholecalciferol (VITAMIN D3) 25 MCG (1000 UNIT) tablet Take 1,000 Units by mouth daily.    cyclobenzaprine (FLEXERIL) 5 MG tablet Take 1 tablet (5 mg total) by mouth at bedtime.    diclofenac Sodium (VOLTAREN) 1 % GEL Apply 2 g topically daily as needed (pain).    diltiazem (CARDIZEM CD) 240 MG 24 hr capsule Take 1 capsule (240 mg total) by mouth daily.    docusate sodium (COLACE) 100 MG capsule Take 1 capsule (100 mg total) by mouth 2 (two) times daily. To keep stools soft (Patient taking differently: Take 100 mg by mouth daily as needed. To keep stools soft)    empagliflozin (JARDIANCE) 25 MG TABS tablet Take 1 tablet (25 mg total) by mouth daily before breakfast.    gabapentin (NEURONTIN) 100 MG capsule Take 100 mg by mouth at bedtime. 06/21/2022: Prescribed by Dr. Alba Destine   glucose blood (CONTOUR NEXT TEST) test strip To check BG BID    lactulose (CHRONULAC) 10 GM/15ML solution Take 15 mLs  (10 g total) by mouth 2 (two) times daily as needed for mild constipation.    Lancets (ONETOUCH DELICA PLUS [id number]) MISC USE TO TEST BLOOD SUGAR ONCE DAILY    loratadine (CLARITIN) 10 MG tablet Take 10 mg by mouth daily as needed for allergies.    losartan (COZAAR) 100 MG tablet Take 1 tablet (100 mg total) by mouth daily.    montelukast (SINGULAIR) 10 MG tablet Take 1 tablet (10 mg total) by mouth at bedtime.    Multiple Vitamin (MULTIVITAMIN) tablet Take 1 tablet by mouth daily. Women's 50+    Polyethyl Glycol-Propyl Glycol (SYSTANE OP) Place 1 drop into both eyes daily as needed (dry/irritated eyes).    rosuvastatin (CRESTOR) 5 MG tablet Take 1 tablet (5 mg total) by mouth daily.    Semaglutide (RYBELSUS) 3 MG TABS Take 3 mg by mouth daily.    No facility-administered encounter medications on file as of 07/26/2022.   Lynann Bologna, CPA/CMA Clinical Pharmacist Assistant Phone: [phone]

## 2022-08-09 ENCOUNTER — Ambulatory Visit: Payer: Self-pay

## 2022-08-09 NOTE — Telephone Encounter (Signed)
  Chief Complaint: abdominal pain  Symptoms: lower abdominal pain, comes and goes, urinary frequency, HA  Frequency: 1 month  Pertinent Negatives: NA Disposition: '[]'$ ED /'[]'$ Urgent Care (no appt availability in office) / '[x]'$ Appointment(In office/virtual)/ '[]'$  [facility]/ '[]'$ Home Care/ '[]'$ Refused Recommended Disposition /'[]'$ [facility]/ '[]'$  Follow-up with PCP Additional Notes: scheduled appt for tomorrow at 0920 as requested.   Reason for Disposition  [1] MILD pain (e.g., does not interfere with normal activities) AND [2] pain comes and goes (cramps) AND [3] present > 48 hours  (Exception: This same abdominal pain is a chronic symptom recurrent or ongoing AND present > 4 weeks.)  Answer Assessment - Initial Assessment Questions 1. LOCATION: "Where does it hurt?"      Lower abdomen  3. ONSET: "When did the pain begin?" (e.g., minutes, hours or days ago)      1 month  5. PATTERN "Does the pain come and go, or is it constant?"    - If it comes and goes: "How long does it last?" "Do you have pain now?"     (Note: Comes and goes means the pain is intermittent. It goes away completely between bouts.)    - If constant: "Is it getting better, staying the same, or getting worse?"      (Note: Constant means the pain never goes away completely; most serious pain is constant and gets worse.)      Comes and goes  6. SEVERITY: "How bad is the pain?"  (e.g., Scale 1-10; mild, moderate, or severe)    - MILD (1-3): Doesn't interfere with normal activities, abdomen soft and not tender to touch.     - MODERATE (4-7): Interferes with normal activities or awakens from sleep, abdomen tender to touch.     - SEVERE (8-10): Excruciating pain, doubled over, unable to do any normal activities.       Mild right now but can be a 10 10. OTHER SYMPTOMS: "Do you have any other symptoms?" (e.g., back pain, diarrhea, fever, urination pain, vomiting)       Urinary frequency, HA  Protocols used: Abdominal  Pain - Female-A-AH

## 2022-08-10 ENCOUNTER — Ambulatory Visit (INDEPENDENT_AMBULATORY_CARE_PROVIDER_SITE_OTHER): Payer: PPO | Admitting: Family Medicine

## 2022-08-10 ENCOUNTER — Encounter: Payer: Self-pay | Admitting: Family Medicine

## 2022-08-10 ENCOUNTER — Ambulatory Visit (INDEPENDENT_AMBULATORY_CARE_PROVIDER_SITE_OTHER): Payer: PPO

## 2022-08-10 VITALS — BP 137/76 | HR 85 | Temp 98.1°F | Resp 16 | Wt 179.0 lb

## 2022-08-10 VITALS — Ht 65.0 in | Wt 179.0 lb

## 2022-08-10 DIAGNOSIS — R300 Dysuria: Secondary | ICD-10-CM | POA: Diagnosis not present

## 2022-08-10 DIAGNOSIS — Z0000 Encounter for general adult medical examination without abnormal findings: Secondary | ICD-10-CM | POA: Diagnosis not present

## 2022-08-10 DIAGNOSIS — Z1231 Encounter for screening mammogram for malignant neoplasm of breast: Secondary | ICD-10-CM

## 2022-08-10 LAB — POCT URINALYSIS DIPSTICK
Bilirubin, UA: NEGATIVE
Blood, UA: POSITIVE
Glucose, UA: NEGATIVE
Ketones, UA: NEGATIVE
Nitrite, UA: NEGATIVE
Protein, UA: NEGATIVE
Spec Grav, UA: 1.02 (ref 1.010–1.025)
Urobilinogen, UA: 0.2 E.U./dL
pH, UA: 6 (ref 5.0–8.0)

## 2022-08-10 MED ORDER — CEPHALEXIN 500 MG PO CAPS: 500.0000 mg | ORAL_CAPSULE | Freq: Four times a day (QID) | ORAL | 0 refills | Status: AC

## 2022-08-10 NOTE — Progress Notes (Signed)
Virtual Visit via Telephone Note  I connected with  Molly Potter on 08/10/22 at  2:00 PM EST by telephone and verified that I am speaking with the correct person using two identifiers.  Location: Patient: home Provider: [facility] Persons participating in the virtual visit: [place]   I discussed the limitations, risks, security and privacy concerns of performing an evaluation and management service by telephone and the availability of in person appointments. The patient expressed understanding and agreed to proceed.  Interactive audio and video telecommunications were attempted between this nurse and patient, however failed, due to patient having technical difficulties OR patient did not have access to video capability.  We continued and completed visit with audio only.  Some vital signs may be absent or patient reported.   Dionisio David, LPN  Subjective:   Molly Potter is a 66 y.o. female who presents for Medicare Annual (Subsequent) preventive examination.  Review of Systems     Cardiac Risk Factors include: advanced age (>56mn, >>8women);diabetes mellitus;hypertension     Objective:    Today's Vitals   08/10/22 1355  PainSc: 4    There is no height or weight on file to calculate BMI.     08/10/2022    2:06 PM 06/30/2021    8:06 AM 02/02/2021    6:37 AM 01/28/2021    3:46 PM 08/30/2019   12:28 PM 01/15/2019    2:42 PM 07/15/2016   10:34 PM  Advanced Directives  Does Patient Have a Medical Advance Directive? _0  No No  Would patient like information on creating a medical advance directive? No - Patient declined No - Patient declined No - Patient declined No - Patient declined  No - Patient declined     Current Medications (verified) Outpatient Encounter Medications as of 08/10/2022  Medication Sig   acetaminophen (TYLENOL) 500 MG tablet Take 500 mg by mouth 2 (two) times daily.   blood glucose meter kit and supplies Dispense based on  patient and insurance preference. To check fasting blood sugar once daily. E1.22   cephALEXin (KEFLEX) 500 MG capsule Take 1 capsule (500 mg total) by mouth 4 (four) times daily for 5 days.   cholecalciferol (VITAMIN D3) 25 MCG (1000 UNIT) tablet Take 1,000 Units by mouth daily.   cyclobenzaprine (FLEXERIL) 5 MG tablet Take 1 tablet (5 mg total) by mouth at bedtime.   diclofenac Sodium (VOLTAREN) 1 % GEL Apply 2 g topically daily as needed (pain).   diltiazem (CARDIZEM CD) 240 MG 24 hr capsule Take 1 capsule (240 mg total) by mouth daily.   docusate sodium (COLACE) 100 MG capsule Take 1 capsule (100 mg total) by mouth 2 (two) times daily. To keep stools soft (Patient taking differently: Take 100 mg by mouth daily as needed. To keep stools soft)   gabapentin (NEURONTIN) 100 MG capsule Take 100 mg by mouth at bedtime.   glucose blood (CONTOUR NEXT TEST) test strip To check BG BID   lactulose (CHRONULAC) 10 GM/15ML solution Take 15 mLs (10 g total) by mouth 2 (two) times daily as needed for mild constipation.   Lancets (ONETOUCH DELICA PLUS [id number] MISC USE TO TEST BLOOD SUGAR ONCE DAILY   loratadine (CLARITIN) 10 MG tablet Take 10 mg by mouth daily as needed for allergies.   losartan (COZAAR) 100 MG tablet Take 1 tablet (100 mg total) by mouth daily.   montelukast (SINGULAIR) 10 MG tablet Take 1 tablet (10 mg total)  by mouth at bedtime.   Multiple Vitamin (MULTIVITAMIN) tablet Take 1 tablet by mouth daily. Women's 50+   Polyethyl Glycol-Propyl Glycol (SYSTANE OP) Place 1 drop into both eyes daily as needed (dry/irritated eyes).   rosuvastatin (CRESTOR) 5 MG tablet Take 1 tablet (5 mg total) by mouth daily.   [DISCONTINUED] empagliflozin (JARDIANCE) 25 MG TABS tablet Take 1 tablet (25 mg total) by mouth daily before breakfast. (Patient not taking: Reported on 08/10/2022)   [DISCONTINUED] Semaglutide (RYBELSUS) 3 MG TABS Take 3 mg by mouth daily. (Patient not taking: Reported on 08/10/2022)   No  facility-administered encounter medications on file as of 08/10/2022.    Allergies (verified) Effexor [venlafaxine], Hylan g-f 20, Diazepam, Jardiance [empagliflozin], Tramadol, and Methylprednisolone   History: Past Medical History:  Diagnosis Date   Arthritis    Diabetes mellitus without complication (HCC)    diet controlled   Fatigue    GERD (gastroesophageal reflux disease)    History of eczema    Hypertension    Hyperthyroidism    Joint pain    PONV (postoperative nausea and vomiting)    Past Surgical History:  Procedure Laterality Date   ABDOMINAL HYSTERECTOMY  01/2021   CYSTOCELE REPAIR N/A 02/02/2021   Procedure: ANTERIOR REPAIR (CYSTOCELE), MODIFIED MCCALLS CULDOPLASTY;  Surgeon: Benjaman Kindler, MD;  Location: [facility];  Service: Gynecology;  Laterality: N/A;   CYSTOSCOPY N/A 02/02/2021   Procedure: CYSTOSCOPY;  Surgeon: Benjaman Kindler, MD;  Location: [facility];  Service: Gynecology;  Laterality: N/A;   LAPAROSCOPIC ASSISTED VAGINAL HYSTERECTOMY N/A 02/02/2021   Procedure: LAPAROSCOPIC ASSISTED VAGINAL HYSTERECTOMY;  Surgeon: Benjaman Kindler, MD;  Location: [facility];  Service: Gynecology;  Laterality: N/A;   LAPAROSCOPIC BILATERAL SALPINGECTOMY Bilateral 02/02/2021   Procedure: LAPAROSCOPIC BILATERAL SALPINGECTOMY AND OOPHERECTOMY;  Surgeon: Benjaman Kindler, MD;  Location: [facility];  Service: Gynecology;  Laterality: Bilateral;   ROTATOR CUFF REPAIR Right    TUBAL LIGATION     Family History  Problem Relation Age of Onset   CAD Mother    Throat cancer Mother    Hypertension Father    Lung cancer Father    Diabetes Maternal Grandmother    Diabetes Paternal Grandfather    Social History   Socioeconomic History   Marital status: Married    Spouse name: Not on file   Number of children: 4   Years of education: GED   Highest education level: Not on file  Occupational History    Employer: [employer]  Tobacco Use   Smoking status: Never   Smokeless  tobacco: Never  Vaping Use   Vaping Use: Never used  Substance and Sexual Activity   Alcohol use: Not Currently    Comment: Occasional   Drug use: No   Sexual activity: Not on file  Other Topics Concern   Not on file  Social History Narrative   Not on file   Social Determinants of Health   Financial Resource Strain: Low Risk  (08/10/2022)   Overall Financial Resource Strain (CARDIA)    Difficulty of Paying Living Expenses: Not very hard  Recent Concern: Emergency planning/management officer Strain - High Risk (06/22/2022)   Overall Financial Resource Strain (CARDIA)    Difficulty of Paying Living Expenses: Hard  Food Insecurity: No Food Insecurity (08/10/2022)   Hunger Vital Sign    Worried About Running Out of Food in the Last Year: Never true    Ran Out of Food in the Last Year: Never true  Transportation Needs: No Transportation Needs (  08/10/2022)   PRAPARE - Hydrologist (Medical): No    Lack of Transportation (Non-Medical): No  Physical Activity: Insufficiently Active (08/10/2022)   Exercise Vital Sign    Days of Exercise per Week: 3 days    Minutes of Exercise per Session: 30 min  Stress: No Stress Concern Present (08/10/2022)   [place]    Feeling of Stress : Only a little  Social Connections: Moderately Integrated (08/10/2022)   Social Connection and Isolation Panel [NHANES]    Frequency of Communication with Friends and Family: More than three times a week    Frequency of Social Gatherings with Friends and Family: Once a week    Attends Religious Services: More than 4 times per year    Active Member of [employer] or Organizations: No    Attends Music therapist: Never    Marital Status: Married    Tobacco Counseling Counseling given: Not Answered   Clinical Intake:  Pre-visit preparation completed: Yes  Pain : 0-10 Pain Score: 4  Pain Location: Other (Comment) (urinary  pain)     Nutritional Risks: None Diabetes: Yes CBG done?: No Did pt. bring in CBG monitor from home?: No  How often do you need to have someone help you when you read instructions, pamphlets, or other written materials from your doctor or pharmacy?: 1 - Never  Diabetic?yes Nutrition Risk Assessment:  Has the patient had any N/V/D within the last 2 months?  No  Does the patient have any non-healing wounds?  No  Has the patient had any unintentional weight loss or weight gain?  No   Diabetes:  Is the patient diabetic?  Yes  If diabetic, was a CBG obtained today?  No  Did the patient bring in their glucometer from home?  No  How often do you monitor your CBG's? occasionally.   Financial Strains and Diabetes Management:  Are you having any financial strains with the device, your supplies or your medication? No .  Does the patient want to be seen by Chronic Care Management for management of their diabetes?  No  Would the patient like to be referred to a Nutritionist or for Diabetic Management?  No   Diabetic Exams:  Diabetic Eye Exam: Completed 03/18/22. Marland Kitchen Pt has been advised about the importance in completing this exam.   Diabetic Foot Exam: Completed 07/20/21. Pt has been advised about the importance in completing this exam.    Interpreter Needed?: No  Information entered by :: Kirke Shaggy, LPN   Activities of Daily Living    08/10/2022    2:07 PM 06/24/2022    9:13 AM  In your present state of health, do you have any difficulty performing the following activities:  Hearing? 0 0  Vision? 0 0  Difficulty concentrating or making decisions? 0 0  Walking or climbing stairs? 1   Dressing or bathing? 0 0  Doing errands, shopping? 0 0  Preparing Food and eating ? N   Using the Toilet? N   In the past six months, have you accidently leaked urine? N   Do you have problems with loss of bowel control? N   Managing your Medications? N   Managing your Finances? N    Housekeeping or managing your Housekeeping? N     Patient Care Team: Gwyneth Sprout, FNP as PCP - General (Family Medicine) Germaine Pomfret, [facility] (Pharmacist)  Indicate any recent Medical  Services you may have received from other than [facility] providers in the past year (date may be approximate).     Assessment:   This is a routine wellness examination for Molly Potter.  Hearing/Vision screen Hearing Screening - Comments:: No aids Vision Screening - Comments:: Wears glasses- [facility]   Dietary issues and exercise activities discussed: Current Exercise Habits: Home exercise routine, Type of exercise: walking, Time (Minutes): 30, Frequency (Times/Week): 3, Weekly Exercise (Minutes/Week): 90, Intensity: Mild   Goals Addressed             This Visit's Progress    DIET - EAT MORE FRUITS AND VEGETABLES         Depression Screen    08/10/2022    2:04 PM 06/24/2022    9:13 AM 03/24/2022    9:07 AM 02/26/2022    8:37 AM 07/20/2021    9:02 AM 07/21/2020    4:13 PM 06/29/2019    2:04 PM  PHQ 2/9 Scores  PHQ - 2 Score 0 0 0 0 0 0 0  PHQ- 9 Score 0 _0 Fall Risk    08/10/2022    2:07 PM 06/24/2022    9:12 AM 03/24/2022    9:06 AM 02/26/2022    8:36 AM 07/20/2021    9:02 AM  Fall Risk   Falls in the past year? 1 1 0 1 0  Number falls in past yr: 0  0 0 0  Injury with Fall? 0 1 0 1 0  Risk for fall due to : History of fall(s)    No Fall Risks  Follow up Falls prevention discussed;Falls evaluation completed    Falls evaluation completed    FALL RISK PREVENTION PERTAINING TO THE HOME:  Any stairs in or around the home? No  If so, are there any without handrails? No  Home free of loose throw rugs in walkways, pet beds, electrical cords, etc? Yes  Adequate lighting in your home to reduce risk of falls? Yes   ASSISTIVE DEVICES UTILIZED TO PREVENT FALLS:  Life alert? No  Use of a cane, walker or w/c? No  Grab bars in the bathroom? Yes  Shower chair or bench in  shower? No  Elevated toilet seat or a handicapped toilet? No   Cognitive Function:        08/10/2022    2:08 PM  6CIT Screen  What Year? 0 points  What month? 0 points  What time? 0 points  Count back from 20 0 points  Months in reverse 0 points  Repeat phrase 0 points  Total Score 0 points    Immunizations Immunization History  Administered Date(s) Administered   Fluad Quad(high Dose 65+) 06/24/2022   Influenza Inj Mdck Quad Pf 09/19/2018   Influenza,inj,Quad PF,6+ Mos 07/03/2014, 06/29/2019   Influenza-Unspecified 06/27/2020, 06/26/2021   Moderna Sars-Covid-2 Vaccination 12/13/2019, 01/10/2020   Tdap 06/29/2019   Zoster, Live 11/08/2011    TDAP status: Up to date  Flu Vaccine status: Up to date  Pneumococcal vaccine status: Due, Education has been provided regarding the importance of this vaccine. Advised may receive this vaccine at local pharmacy or Health Dept. Aware to provide a copy of the vaccination record if obtained from local pharmacy or Health Dept. Verbalized acceptance and understanding.  Covid-19 vaccine status: Completed vaccines  Qualifies for Shingles Vaccine? Yes   Zostavax completed Yes   Shingrix Completed?: No.    Education has been provided regarding the importance  of this vaccine. Patient has been advised to call insurance company to determine out of pocket expense if they have not yet received this vaccine. Advised may also receive vaccine at local pharmacy or Health Dept. Verbalized acceptance and understanding.  Screening Tests Health Maintenance  Topic Date Due   Zoster Vaccines- Shingrix (1 of 2) Never done   COVID-19 Vaccine (3 - 2023-24 season) 05/14/2022   FOOT EXAM  07/20/2022   Pneumonia Vaccine 61+ Years old (1 - PCV) 09/25/2022 (Originally 07/04/2021)   HEMOGLOBIN A1C  12/24/2022   Diabetic kidney evaluation - Urine ACR  12/25/2022   OPHTHALMOLOGY EXAM  03/19/2023   Diabetic kidney evaluation - GFR measurement  06/25/2023    Medicare Annual Wellness (AWV)  08/11/2023   MAMMOGRAM  08/18/2023   Fecal DNA (Cologuard)  08/04/2024   INFLUENZA VACCINE  Completed   DEXA SCAN  Completed   Hepatitis C Screening  Completed   HPV VACCINES  Aged Out    Health Maintenance  Health Maintenance Due  Topic Date Due   Zoster Vaccines- Shingrix (1 of 2) Never done   COVID-19 Vaccine (3 - 2023-24 season) 05/14/2022   FOOT EXAM  07/20/2022    Colorectal cancer screening: Type of screening: Cologuard. Completed 08/10/21. Repeat every 3 years  Mammogram status: Completed 09/09/21. Repeat every year  Bone Density status: Completed 08/17/21. Results reflect: Bone density results: NORMAL. Repeat every 5 years.  Lung Cancer Screening: (Low Dose CT Chest recommended if Age 22-80 years, 30 pack-year currently smoking OR have quit w/in 15years.) does not qualify.    Additional Screening:  Hepatitis C Screening: does qualify; Completed 06/15/17  Vision Screening: Recommended annual ophthalmology exams for early detection of glaucoma and other disorders of the eye. Is the patient up to date with their annual eye exam?  Yes  Who is the provider or what is the name of the office in which the patient attends annual eye exams? [facility] If pt is not established with a provider, would they like to be referred to a provider to establish care? No .   Dental Screening: Recommended annual dental exams for proper oral hygiene  Community Resource Referral / Chronic Care Management: CRR required this visit?  No   CCM required this visit?  No      Plan:     I have personally reviewed and noted the following in the patient's chart:   Medical and social history Use of alcohol, tobacco or illicit drugs  Current medications and supplements including opioid prescriptions. Patient is not currently taking opioid prescriptions. Functional ability and status Nutritional status Physical activity Advanced directives List of other  physicians Hospitalizations, surgeries, and ER visits in previous 12 months Vitals Screenings to include cognitive, depression, and falls Referrals and appointments  In addition, I have reviewed and discussed with patient certain preventive protocols, quality metrics, and best practice recommendations. A written personalized care plan for preventive services as well as general preventive health recommendations were provided to patient.     Dionisio David, LPN   [id number]   Nurse Notes: none

## 2022-08-10 NOTE — Addendum Note (Signed)
Addended by: Dionisio David on: 08/10/2022 02:42 PM   Modules accepted: Orders

## 2022-08-10 NOTE — Patient Instructions (Signed)
Molly Potter , Thank you for taking time to come for your Medicare Wellness Visit. I appreciate your ongoing commitment to your health goals. Please review the following plan we discussed and let me know if I can assist you in the future.   Screening recommendations/referrals: Colonoscopy: Cologuard 08/10/21 Mammogram: 09/09/21 Bone Density: 08/17/21 Recommended yearly ophthalmology/optometry visit for glaucoma screening and checkup Recommended yearly dental visit for hygiene and checkup  Vaccinations: Influenza vaccine: 06/24/22 Pneumococcal vaccine: n/d Tdap vaccine: 06/29/19 Shingles vaccine: Zostavax 11/08/11   Covid-19:12/13/19, 01/10/20  Advanced directives: no  Conditions/risks identified: none  Next appointment: Follow up in one year for your annual wellness visit 08/15/23 @ 10:30 am by phone   Preventive Care 65 Years and Older, Female Preventive care refers to lifestyle choices and visits with your health care provider that can promote health and wellness. What does preventive care include? A yearly physical exam. This is also called an annual well check. Dental exams once or twice a year. Routine eye exams. Ask your health care provider how often you should have your eyes checked. Personal lifestyle choices, including: Daily care of your teeth and gums. Regular physical activity. Eating a healthy diet. Avoiding tobacco and drug use. Limiting alcohol use. Practicing safe sex. Taking low-dose aspirin every day. Taking vitamin and mineral supplements as recommended by your health care provider. What happens during an annual well check? The services and screenings done by your health care provider during your annual well check will depend on your age, overall health, lifestyle risk factors, and family history of disease. Counseling  Your health care provider may ask you questions about your: Alcohol use. Tobacco use. Drug use. Emotional well-being. Home and relationship  well-being. Sexual activity. Eating habits. History of falls. Memory and ability to understand (cognition). Work and work Statistician. Reproductive health. Screening  You may have the following tests or measurements: Height, weight, and BMI. Blood pressure. Lipid and cholesterol levels. These may be checked every 5 years, or more frequently if you are over 14 years old. Skin check. Lung cancer screening. You may have this screening every year starting at age 31 if you have a 30-pack-year history of smoking and currently smoke or have quit within the past 15 years. Fecal occult blood test (FOBT) of the stool. You may have this test every year starting at age 64. Flexible sigmoidoscopy or colonoscopy. You may have a sigmoidoscopy every 5 years or a colonoscopy every 10 years starting at age 75. Hepatitis C blood test. Hepatitis B blood test. Sexually transmitted disease (STD) testing. Diabetes screening. This is done by checking your blood sugar (glucose) after you have not eaten for a while (fasting). You may have this done every 1-3 years. Bone density scan. This is done to screen for osteoporosis. You may have this done starting at age 47. Mammogram. This may be done every 1-2 years. Talk to your health care provider about how often you should have regular mammograms. Talk with your health care provider about your test results, treatment options, and if necessary, the need for more tests. Vaccines  Your health care provider may recommend certain vaccines, such as: Influenza vaccine. This is recommended every year. Tetanus, diphtheria, and acellular pertussis (Tdap, Td) vaccine. You may need a Td booster every 10 years. Zoster vaccine. You may need this after age 34. Pneumococcal 13-valent conjugate (PCV13) vaccine. One dose is recommended after age 62. Pneumococcal polysaccharide (PPSV23) vaccine. One dose is recommended after age 61. Talk to your health  care provider about which  screenings and vaccines you need and how often you need them. This information is not intended to replace advice given to you by your health care provider. Make sure you discuss any questions you have with your health care provider. Document Released: 09/26/2015 Document Revised: 05/19/2016 Document Reviewed: 07/01/2015 Elsevier Interactive Patient Education  2017 [place] Prevention in the Home Falls can cause injuries. They can happen to people of all ages. There are many things you can do to make your home safe and to help prevent falls. What can I do on the outside of my home? Regularly fix the edges of walkways and driveways and fix any cracks. Remove anything that might make you trip as you walk through a door, such as a raised step or threshold. Trim any bushes or trees on the path to your home. Use bright outdoor lighting. Clear any walking paths of anything that might make someone trip, such as rocks or tools. Regularly check to see if handrails are loose or broken. Make sure that both sides of any steps have handrails. Any raised decks and porches should have guardrails on the edges. Have any leaves, snow, or ice cleared regularly. Use sand or salt on walking paths during winter. Clean up any spills in your garage right away. This includes oil or grease spills. What can I do in the bathroom? Use night lights. Install grab bars by the toilet and in the tub and shower. Do not use towel bars as grab bars. Use non-skid mats or decals in the tub or shower. If you need to sit down in the shower, use a plastic, non-slip stool. Keep the floor dry. Clean up any water that spills on the floor as soon as it happens. Remove soap buildup in the tub or shower regularly. Attach bath mats securely with double-sided non-slip rug tape. Do not have throw rugs and other things on the floor that can make you trip. What can I do in the bedroom? Use night lights. Make sure that you have a  light by your bed that is easy to reach. Do not use any sheets or blankets that are too big for your bed. They should not hang down onto the floor. Have a firm chair that has side arms. You can use this for support while you get dressed. Do not have throw rugs and other things on the floor that can make you trip. What can I do in the kitchen? Clean up any spills right away. Avoid walking on wet floors. Keep items that you use a lot in easy-to-reach places. If you need to reach something above you, use a strong step stool that has a grab bar. Keep electrical cords out of the way. Do not use floor polish or wax that makes floors slippery. If you must use wax, use non-skid floor wax. Do not have throw rugs and other things on the floor that can make you trip. What can I do with my stairs? Do not leave any items on the stairs. Make sure that there are handrails on both sides of the stairs and use them. Fix handrails that are broken or loose. Make sure that handrails are as long as the stairways. Check any carpeting to make sure that it is firmly attached to the stairs. Fix any carpet that is loose or worn. Avoid having throw rugs at the top or bottom of the stairs. If you do have throw rugs, attach them to  the floor with carpet tape. Make sure that you have a light switch at the top of the stairs and the bottom of the stairs. If you do not have them, ask someone to add them for you. What else can I do to help prevent falls? Wear shoes that: Do not have high heels. Have rubber bottoms. Are comfortable and fit you well. Are closed at the toe. Do not wear sandals. If you use a stepladder: Make sure that it is fully opened. Do not climb a closed stepladder. Make sure that both sides of the stepladder are locked into place. Ask someone to hold it for you, if possible. Clearly mark and make sure that you can see: Any grab bars or handrails. First and last steps. Where the edge of each step  is. Use tools that help you move around (mobility aids) if they are needed. These include: Canes. Walkers. Scooters. Crutches. Turn on the lights when you go into a dark area. Replace any light bulbs as soon as they burn out. Set up your furniture so you have a clear path. Avoid moving your furniture around. If any of your floors are uneven, fix them. If there are any pets around you, be aware of where they are. Review your medicines with your doctor. Some medicines can make you feel dizzy. This can increase your chance of falling. Ask your doctor what other things that you can do to help prevent falls. This information is not intended to replace advice given to you by your health care provider. Make sure you discuss any questions you have with your health care provider. Document Released: 06/26/2009 Document Revised: 02/05/2016 Document Reviewed: 10/04/2014 Elsevier Interactive Patient Education  2017 [employer].

## 2022-08-10 NOTE — Progress Notes (Signed)
    SUBJECTIVE:   CHIEF COMPLAINT / HPI:   URINARY SYMPTOMS - 3 week duration, intermittent lower abdomen cramping, burning with urination.  Dysuria: burning Urinary frequency: yes Urgency: yes Urinary incontinence: no Foul odor: yes Hematuria: no Abdominal pain: yes Back pain:  last week Suprapubic pain/pressure: yes Fever:  hasn't checked Vomiting: no, has felt nauseous last week.  Relief with cranberry juice: helping Relief with pyridium: no Status: better/worse/stable Previous urinary tract infection: no Recurrent urinary tract infection: no Vaginal discharge: no Treatments attempted: cranberry juice  H/o hysterectomy 2022 with resultant cystocele. S/p PT.   OBJECTIVE:   BP 137/76 (BP Location: Right Arm, Patient Position: Sitting, Cuff Size: Large)   Pulse 85   Temp 98.1 F (36.7 C) (Oral)   Resp 16   Wt 179 lb (81.2 kg)   SpO2 97%   BMI 29.79 kg/m   Gen: well appearing, in NAD Card: Reg rate Lungs: Comfortable WOB on RA Ext: WWP   ASSESSMENT/PLAN:   Dysuria Acute. UA with leuks, trace blood. Will send for micro and culture. Rx keflex x5 days, will adjust based on culture results. F/u prn.    Myles Gip, DO

## 2022-08-11 ENCOUNTER — Telehealth: Payer: Self-pay

## 2022-08-11 LAB — URINALYSIS, ROUTINE W REFLEX MICROSCOPIC
Bilirubin, UA: NEGATIVE
Glucose, UA: NEGATIVE
Ketones, UA: NEGATIVE
Nitrite, UA: NEGATIVE
Protein,UA: NEGATIVE
RBC, UA: NEGATIVE
Specific Gravity, UA: 1.016 (ref 1.005–1.030)
Urobilinogen, Ur: 0.2 mg/dL (ref 0.2–1.0)
pH, UA: 5 (ref 5.0–7.5)

## 2022-08-11 LAB — MICROSCOPIC EXAMINATION
Casts: NONE SEEN /lpf
RBC, Urine: NONE SEEN /hpf (ref 0–2)
WBC, UA: >30 /hpf — AB (ref 0–5)

## 2022-08-11 NOTE — Telephone Encounter (Signed)
   CCM RN Visit Note   08/11/22 Name: Molly Potter MRN: [id number]      DOB: 27-Dec-1955  Subjective: Molly Potter is a 66 y.o. year old female who is a primary care patient of Gwyneth Sprout, FNP. The patient was referred to the Chronic Care Management team for assistance with care management needs subsequent to provider initiation of CCM services and plan of care.      An unsuccessful telephone outreach was attempted today to contact the patient about Chronic Care Management needs.    PLAN: A HIPAA compliant phone message was left for the patient providing contact information and requesting a return call.   Horris Latino RN Care Manager/Chronic Care Management [phone]

## 2022-08-12 NOTE — Patient Instructions (Signed)
Visit Information It was great speaking with you today!  Please let me know if you have any questions about our visit.  Patient Care Plan: General Pharmacy (Adult)     Problem Identified: Hypertension, Hyperlipidemia, Diabetes, and Osteoarthritis   Priority: High     Long-Range Goal: Patient-Specific Goal   Start Date: 06/22/2022  Expected End Date: 06/23/2023  This Visit's Progress: On track  Recent Progress: On track  Priority: High  Note:   Current Barriers:  Unable to independently afford treatment regimen Unable to achieve control of Diabetes, Constipation   Pharmacist Clinical Goal(s):  Patient will achieve control of diabetes as evidenced by A1c less than 7% through collaboration with PharmD and provider.   Interventions: 1:1 collaboration with Gwyneth Sprout, FNP regarding development and update of comprehensive plan of care as evidenced by provider attestation and co-signature Inter-disciplinary care team collaboration (see longitudinal plan of care) Comprehensive medication review performed; medication list updated in electronic medical record  Hypertension (BP goal <130/80) -Controlled -Current treatment: Diltiazem CD 240 mg daily  Losartan 100 mg daily  -Medications previously tried: Amlodipine, Metoprolol, Propanolol, Verapamil   -Current home readings: 117-120s/60s.  -Current dietary habits: Does add salt in her cooking. -Denies hypotensive/hypertensive symptoms -Recommended to continue current medication  Hyperlipidemia: (LDL goal < 70) -Controlled -Current treatment: Rosuvastatin 5 mg daily  -Medications previously tried: NA  -Recommended to continue current medication  Diabetes (A1c goal <7%) -Uncontrolled -Current medications: None  -Medications previously tried: Glipizide (Constipation), Jardiance (Stomach ache, diarrhea, skin burning)  -Hesitant about injectable medications.  -Current home glucose readings fasting glucose:  -Denies  hypoglycemic/hyperglycemic symptoms -Current meal patterns: Trying to eat healthier, smaller portions overall.  snacks: Activia Yogurt drinks: Decaf coffee or herbal tea most mornings. Drinks 6-8 bottles of water.  -Current exercise: Walking most days, [employer] aer[employer] less these days due to [place].  -Did not tolerate Jardiance. We discussed starting metformin or Rybelsus, patient hesitant due to her ongoing diarrhea. -STOP Jardiance  -1-week wash out period, then will decide on starting metformin or Rybelsus if patient amenable.    Constipation (Goal: Improve symptoms) -Uncontrolled -Current treatment  Docusate 100 mg as needed Takes 1-2 times weekly  Lactulose as needed  -Medications previously tried: Miralax (racing heart) -Rarely uses lactulose due to complaints of cramping, flatulence.  -Acitiva Yogurt daily  -Patient continues to struggle with constipation. Counseled patient on adequate water intake, increasing dietary fiber intake.  -INCREASE docusate to once daily rather than PRN.  -MODIFY Lactulose use to weekends when she does not need to work.   Patient Goals/Self-Care Activities Patient will:  - check glucose daily before breakfast, document, and provide at future appointments check blood pressure weekly, document, and provide at future appointments  Follow Up Plan: Telephone follow up appointment with care management team member scheduled for:  09/20/2022 at 1:00 PM    Patient agreed to services and verbal consent obtained.   Patient verbalizes understanding of instructions and care plan provided today and agrees to view in [place]. Active MyChart status and patient understanding of how to access instructions and care plan via MyChart confirmed with patient.     [name], PharmD, [place], CPP  Clinical Pharmacist Practitioner  [facility] [phone]

## 2022-08-15 LAB — CULTURE, URINE COMPREHENSIVE

## 2022-08-16 MED ORDER — SULFAMETHOXAZOLE-TRIMETHOPRIM 800-160 MG PO TABS: 1.0000 | ORAL_TABLET | Freq: Two times a day (BID) | ORAL | 0 refills | Status: AC

## 2022-08-16 NOTE — Addendum Note (Signed)
Addended by: Myles Gip on: 08/16/2022 08:38 AM   Modules accepted: Orders

## 2022-08-17 ENCOUNTER — Telehealth: Payer: Self-pay

## 2022-08-17 NOTE — Progress Notes (Signed)
  Chronic Care Management   Note  08/17/2022 Name: CHLOE BLUETT MRN: [id number] DOB: 03/25/1956  [name] is a 66 y.o. year old female who is a primary care patient of Gwyneth Sprout, FNP. I reached out to [place] by phone today in response to a referral sent by Ms. Kynzlie B Zaragosa's PCP.  Ms. NAMYA VOGES  agreedto scheduling an appointment with the CCM RN Case Manager   Follow up plan: Patient agreed to scheduled appointment with RN Case Manager on 08/30/2022(date/time).   Noreene Larsson, [place], [place] [address] Direct Dial: [phone] [email or web address]

## 2022-08-30 ENCOUNTER — Telehealth: Payer: PPO

## 2022-08-30 ENCOUNTER — Telehealth: Payer: Self-pay

## 2022-08-30 NOTE — Telephone Encounter (Signed)
   CCM RN Visit Note   08/30/22 Name: Molly Potter MRN: [id number]      DOB: 1956-08-05  Subjective: Molly Potter is a 65 y.o. year old female who is a primary care patient of Gwyneth Sprout, FNP. The patient was referred to the Chronic Care Management team for assistance with care management needs subsequent to provider initiation of CCM services and plan of care.      An unsuccessful outreach attempt was made today for a scheduled CCM visit.   PLAN: A HIPAA compliant phone message was left for the patient providing contact information and requesting a return call.   Horris Latino RN Care Manager/Chronic Care Management [phone]

## 2022-09-03 ENCOUNTER — Other Ambulatory Visit: Payer: Self-pay | Admitting: Family Medicine

## 2022-09-03 DIAGNOSIS — E1122 Type 2 diabetes mellitus with diabetic chronic kidney disease: Secondary | ICD-10-CM

## 2022-09-03 DIAGNOSIS — E1169 Type 2 diabetes mellitus with other specified complication: Secondary | ICD-10-CM

## 2022-09-03 DIAGNOSIS — E1159 Type 2 diabetes mellitus with other circulatory complications: Secondary | ICD-10-CM

## 2022-09-09 ENCOUNTER — Telehealth: Payer: Self-pay | Admitting: *Deleted

## 2022-09-09 NOTE — Progress Notes (Signed)
  Care Coordination  Outreach Note  09/09/2022 Name: TEYLOR WOLVEN MRN: [id number] DOB: December 14, 1955   Care Coordination Outreach Attempts: An unsuccessful telephone outreach was attempted today to offer the patient information about available care coordination services as a benefit of their health plan.   Referral received   Follow Up Plan:  Additional outreach attempts will be made to offer the patient care coordination information and services.   Encounter Outcome:  No Answer  Julian Hy, [place] Direct Dial: [phone]

## 2022-09-16 NOTE — Progress Notes (Signed)
  Care Coordination   Note   09/16/2022 Name: PAW KARSTENS MRN: [id number] DOB: 05-22-1956  [name] is a 67 y.o. year old female who sees Gwyneth Sprout, FNP for primary care. I reached out to [employer] by phone today to offer care coordination services.  Ms. Kerman was given information about Care Coordination services today including:   The Care Coordination services include support from the care team which includes your Nurse Coordinator, Clinical Social Worker, or Pharmacist.  The Care Coordination team is here to help remove barriers to the health concerns and goals most important to you. Care Coordination services are voluntary, and the patient may decline or stop services at any time by request to their care team member.   Care Coordination Consent Status: Patient agreed to services and verbal consent obtained.   Follow up plan:  Telephone appointment with care coordination team member scheduled for:  09/22/2022  Encounter Outcome:  Pt. Scheduled from referral   Julian Hy, [place] Direct Dial: [phone]

## 2022-09-20 ENCOUNTER — Telehealth: Payer: PPO

## 2022-09-22 ENCOUNTER — Encounter: Payer: Self-pay | Admitting: *Deleted

## 2022-09-27 ENCOUNTER — Other Ambulatory Visit: Payer: Self-pay | Admitting: Family Medicine

## 2022-09-27 DIAGNOSIS — M5412 Radiculopathy, cervical region: Secondary | ICD-10-CM

## 2022-09-30 ENCOUNTER — Encounter: Payer: Self-pay | Admitting: *Deleted

## 2022-10-01 ENCOUNTER — Ambulatory Visit: Payer: Self-pay | Admitting: *Deleted

## 2022-10-01 NOTE — Patient Instructions (Signed)
Visit Information  Thank you for taking time to visit with me today. Please don't hesitate to contact me if I can be of assistance to you.   Following are the goals we discussed today:   Goals Addressed             This Visit's Progress    Care coordination activities       Care Coordination Interventions: Care Management program discussed, role of social worker explained SDOH screen completed Patient's adjustment to her medical condition explored Patient discussed that the adjustment to her condition is a process, confirms having a strong spiritual life that helps her to cope with condition Patient also confirms having a strong support system of family and friends that she can rely on Patient denied having any community resource needs at this time Patient encouraged to contact this social worker if any community resource needs may arise Depression screen reviewed  Active listening / Reflection utilized  Emotional Support Provided         Please call the care guide team at [phone] if you need to cancel or reschedule your appointment.   If you are experiencing a Mental Health or [place] or need someone to talk to, please call 911   Patient verbalizes understanding of instructions and care plan provided today and agrees to view in [place]. Active MyChart status and patient understanding of how to access instructions and care plan via MyChart confirmed with patient.     No further follow up required: patient to contact this Education officer, museum with any community resource needs  [employer], [place] Worker  [facility] Care Management [phone]

## 2022-10-01 NOTE — Patient Outreach (Signed)
  Care Coordination   Initial Visit Note   10/01/2022 Name: Molly Potter MRN: [id number] DOB: December 23, 1955  Molly Potter is a 67 y.o. year old female who sees Molly Sprout, FNP for primary care. I spoke with  Molly Potter by phone today.  What matters to the patients health and wellness today?  Patient declined need for any community resources at this time    Goals Addressed             This Visit's Progress    Care coordination activities       Care Coordination Interventions: Care Management program discussed, role of social worker explained SDOH screen completed Patient's adjustment to her medical condition explored Patient discussed that the adjustment to her condition is a process, confirms having a strong spiritual life that helps her to cope with condition Patient also confirms having a strong support system of family and friends that she can rely on Patient denied having any community resource needs at this time Patient encouraged to contact this social worker if any community resource needs may arise Depression screen reviewed  Active listening / Reflection utilized  Engineer, petroleum Provided         SDOH assessments and interventions completed:  Yes  SDOH Interventions Today    Flowsheet Row Most Recent Value  SDOH Interventions   Food Insecurity Interventions Intervention Not Indicated  Housing Interventions Intervention Not Indicated  Transportation Interventions Intervention Not Indicated        Care Coordination Interventions:  Yes, provided   Follow up plan: No further intervention required.   Encounter Outcome:  Pt. Visit Completed

## 2022-10-01 NOTE — Patient Outreach (Deleted)
  Care Coordination   Initial Visit Note   10/01/2022 Name: Molly Potter MRN: [id number] DOB: July 11, 1956  Molly Potter is a 67 y.o. year old female who sees Gwyneth Sprout, FNP for primary care. I spoke with  Molly Potter by phone today.  What matters to the patients health and wellness today?  Patient reports having no community resource needs at this time    Goals Addressed             This Visit's Progress    Care coordination activities       Care Coordination Interventions: Care Management program discussed, role of social worker explained SDOH screen completed Patient's adjustment to her medical condition explored Patient discussed that the adjustment to her condition is a process, confirms having a strong spiritual life that helps her to cope with condition Patient also confirms having a strong support system of family and friends that she can rely on Patient denied having any community resource needs at this time Patient encouraged to contact this social worker if any community resource needs may arise Depression screen reviewed  Active listening / Reflection utilized  Engineer, petroleum Provided         SDOH assessments and interventions completed:  No{THN Tip this will not be part of the note when signed-REQUIRED REPORT FIELD DO NOT DELETE (Optional):27901}  SDOH Interventions Today    Flowsheet Row Most Recent Value  SDOH Interventions   Food Insecurity Interventions Intervention Not Indicated  Housing Interventions Intervention Not Indicated  Transportation Interventions Intervention Not Indicated        Care Coordination Interventions:  No, not indicated {THN Tip this will not be part of the note when signed-REQUIRED REPORT FIELD DO NOT DELETE (Optional):27901}  Follow up plan: No further intervention required.   Encounter Outcome:  Pt. Visit Completed {THN Tip this will not be part of the note when signed-REQUIRED REPORT FIELD DO NOT DELETE  (Optional):27901}

## 2022-10-01 NOTE — Patient Outreach (Deleted)
{  CARE COORDINATION YKZLD:35701}

## 2022-10-05 ENCOUNTER — Ambulatory Visit: Payer: PPO

## 2022-10-05 DIAGNOSIS — N1831 Chronic kidney disease, stage 3a: Secondary | ICD-10-CM

## 2022-10-05 DIAGNOSIS — I152 Hypertension secondary to endocrine disorders: Secondary | ICD-10-CM

## 2022-10-05 MED ORDER — GLIMEPIRIDE 1 MG PO TABS: 1.0000 mg | ORAL_TABLET | Freq: Every day | ORAL | 2 refills | Status: DC

## 2022-10-05 NOTE — Progress Notes (Signed)
Care Management & Coordination Services Pharmacy Note  10/05/2022 Name:  Molly Potter MRN:  [id number] DOB:  Apr 09, 1956  Summary: Patient presents for follow-up consult. She did not tolerate Jardiance or Rybelsus.   Recommendations/Changes made from today's visit: -START Glimepiride 1 mg daily with breakfast.  -Patient to schedule PCP follow-up for A1c/DM  Follow up plan: CPP follow-up 1 month   Subjective: Molly Potter is an 67 y.o. year old female who is a primary patient of Gwyneth Sprout, FNP.  The care coordination team was consulted for assistance with disease management and care coordination needs.    Engaged with patient by telephone for follow up visit.  Recent office visits: 08/10/22: Patient presented to Dr. Ky Barban for Dysuria.  03/24/2022 [name], [place] (PCP Office Visit) for Annual Physical Exam- Started: Glipizide 10 mg daily with lunch, Lab orders placed, DM Eye Exam order placed, Patient to follow-up in 3 months   Recent consult visits:  05/11/2022 04/20/2022 [name], MD (Phys Med) for Follow-up- No medication changes noted, No orders placed, Patient to follow-up prn   04/20/2022 [name], MD (Phys Med) for Pain-Started: Gabapentin 100 mg to be slowly titrated up to 3 times a day, patient to follow-up in 2 to 3 weeks   04/07/2022 Geoffry Paradise, PT (Rehab Services) for [facility] visits: None in previous 6 months   Objective:  Lab Results  Component Value Date   CREATININE 0.81 06/24/2022   BUN 15 06/24/2022   EGFR 81 06/24/2022   GFRNONAA >60 02/03/2021   GFRAA 85 07/23/2020   NA 142 06/24/2022   K 4.2 06/24/2022   CALCIUM 9.3 06/24/2022   CO2 22 06/24/2022   GLUCOSE 141 (H) 06/24/2022    Lab Results  Component Value Date/Time   HGBA1C 8.5 (H) 06/24/2022 09:33 AM   HGBA1C 9.3 (A) 03/24/2022 09:12 AM   HGBA1C 9.0 (A) 12/24/2021 09:02 AM   HGBA1C 8.4 (H) 07/20/2021 09:35 AM   MICROALBUR negative 07/03/2018  04:43 PM    Last diabetic Eye exam:  Lab Results  Component Value Date/Time   HMDIABEYEEXA No Retinopathy 03/18/2022 12:00 AM    Last diabetic Foot exam: No results found for: "HMDIABFOOTEX"   Lab Results  Component Value Date   CHOL 124 06/24/2022   HDL 43 06/24/2022   LDLCALC 61 06/24/2022   TRIG 111 06/24/2022   CHOLHDL 2.9 06/24/2022       Latest Ref Rng & Units 06/24/2022    9:33 AM 07/20/2021    9:35 AM 01/19/2021    3:34 PM  Hepatic Function  Total Protein 6.0 - 8.5 g/dL 7.1  7.0  6.9   Albumin 3.9 - 4.9 g/dL 4.4  4.6  4.3   AST 0 - 40 IU/L '15  13  18   '$ ALT 0 - 32 IU/L '13  12  13   '$ Alk Phosphatase 44 - 121 IU/L 89  100  89   Total Bilirubin 0.0 - 1.2 mg/dL 0.2  0.3  <0.2     Lab Results  Component Value Date/Time   TSH 1.940 07/20/2021 09:35 AM   TSH 2.240 01/19/2021 03:34 PM   FREET4 1.10 05/28/2015 12:44 PM       Latest Ref Rng & Units 06/24/2022    9:33 AM 07/20/2021    9:35 AM 02/03/2021    5:09 AM  CBC  WBC 3.4 - 10.8 x10E3/uL 9.1  9.0  16.0   Hemoglobin 11.1 - 15.9 g/dL  13.5  13.7  12.2   Hematocrit 34.0 - 46.6 % 39.2  39.6  36.4   Platelets 150 - 450 x10E3/uL 413  399  343     No results found for: "VD25OH", "VITAMINB12"  Clinical ASCVD: No  The ASCVD Risk score (Arnett DK, et al., 2019) failed to calculate for the following reasons:   The valid total cholesterol range is 130 to 320 mg/dL       10/01/2022    3:03 PM 08/10/2022    2:04 PM 06/24/2022    9:13 AM  Depression screen PHQ 2/9  Decreased Interest 0 0 0  Down, Depressed, Hopeless 0 0 0  PHQ - 2 Score 0 0 0  Altered sleeping  0 2  Tired, decreased energy  0 1  Change in appetite  0 1  Feeling bad or failure about yourself   0 0  Trouble concentrating  0 0  Moving slowly or fidgety/restless  0 0  Suicidal thoughts  0 0  PHQ-9 Score  0 4  Difficult doing work/chores  Not difficult at all Not difficult at all     Social History   Tobacco Use  Smoking Status Never  Smokeless  Tobacco Never   BP Readings from Last 3 Encounters:  08/10/22 137/76  06/24/22 128/69  03/24/22 (!) 130/56   Pulse Readings from Last 3 Encounters:  08/10/22 85  06/24/22 76  03/24/22 76   Wt Readings from Last 3 Encounters:  08/10/22 179 lb (81.2 kg)  08/10/22 179 lb (81.2 kg)  06/24/22 185 lb (83.9 kg)   BMI Readings from Last 3 Encounters:  08/10/22 29.79 kg/m  08/10/22 29.79 kg/m  06/24/22 30.79 kg/m    Allergies  Allergen Reactions   Effexor [Venlafaxine] Anaphylaxis   Hylan G-F 20 Other (See Comments)    Myalgias, hand pain, back pain; Benadryl helped pain Other reaction(s): Other Myalgias, hand pain, back pain; Benadryl helped pain Burning sensation all over body   Diazepam     Other reaction(s): Unknown   Jardiance [Empagliflozin] Nausea Only   Tramadol Nausea Only   Methylprednisolone Nausea Only and Other (See Comments)    Headaches and chest pain    Medications Reviewed Today     Reviewed by Dionisio David, LPN (Licensed Practical Nurse) on 08/10/22 at [place] List Status: <None>   Medication Order Taking? Sig Documenting Provider Last Dose Status Informant  acetaminophen (TYLENOL) 500 MG tablet [id number] Yes Take 500 mg by mouth 2 (two) times daily. [provider] Taking Active Self  blood glucose meter kit and supplies [id number] Yes Dispense based on patient and insurance preference. To check fasting blood sugar once daily. E1.22 Gwyneth Sprout, FNP Taking Active   cephALEXin (KEFLEX) 500 MG capsule [id number] Yes Take 1 capsule (500 mg total) by mouth 4 (four) times daily for 5 days. Myles Gip, DO Taking Active   cholecalciferol (VITAMIN D3) 25 MCG (1000 UNIT) tablet [id number] Yes Take 1,000 Units by mouth daily. [provider] Taking Active Self  cyclobenzaprine (FLEXERIL) 5 MG tablet [id number] Yes Take 1 tablet (5 mg total) by mouth at bedtime. Gwyneth Sprout, FNP Taking Active   diclofenac Sodium (VOLTAREN) 1 % GEL  [id number] Yes Apply 2 g topically daily as needed (pain). [provider] Taking Active Self  diltiazem (CARDIZEM CD) 240 MG 24 hr capsule [id number] Yes Take 1 capsule (240 mg total) by mouth daily. Gwyneth Sprout, FNP Taking Active  docusate sodium (COLACE) 100 MG capsule [id number] Yes Take 1 capsule (100 mg total) by mouth 2 (two) times daily. To keep stools soft  Patient taking differently: Take 100 mg by mouth daily as needed. To keep stools soft   Benjaman Kindler, MD Taking Active   gabapentin (NEURONTIN) 100 MG capsule [id number] Yes Take 100 mg by mouth at bedtime. [provider] Taking Active            Med Note Raeford Razor Jun 21, 2022  1:33 PM) Prescribed by Dr. Alba Destine  glucose blood (CONTOUR NEXT TEST) test strip [id number] Yes To check BG BID Gwyneth Sprout, FNP Taking Active   lactulose [facility]) 10 GM/15ML solution [id number] Yes Take 15 mLs (10 g total) by mouth 2 (two) times daily as needed for mild constipation. Gwyneth Sprout, FNP Taking Active   Lancets [facility] Donaciano Eva PLUS [id number]) MISC [id number] Yes USE TO TEST BLOOD SUGAR ONCE DAILY Gwyneth Sprout, FNP Taking Active   loratadine (CLARITIN) 10 MG tablet [id number] Yes Take 10 mg by mouth daily as needed for allergies. [provider] Taking Active Self  losartan (COZAAR) 100 MG tablet [id number] Yes Take 1 tablet (100 mg total) by mouth daily. Gwyneth Sprout, FNP Taking Active   montelukast (SINGULAIR) 10 MG tablet [id number] Yes Take 1 tablet (10 mg total) by mouth at bedtime. Gwyneth Sprout, FNP Taking Active   Multiple Vitamin (MULTIVITAMIN) tablet [id number] Yes Take 1 tablet by mouth daily. Women's 50+ [provider] Taking Active Self  Polyethyl Glycol-Propyl Glycol (SYSTANE OP) [id number] Yes Place 1 drop into both eyes daily as needed (dry/irritated eyes). [provider] Taking Active Self  rosuvastatin (CRESTOR) 5 MG tablet [id number]  Take 1 tablet (5 mg  total) by mouth daily. Gwyneth Sprout, FNP  Active             SDOH:  (Social Determinants of Health) assessments and interventions performed: Yes SDOH Interventions    Flowsheet Row Care Coordination from 10/01/2022 in [place] Coordination Clinical Support from 08/10/2022 in [place] Management from 06/21/2022 in [place] Office Visit from 07/20/2021 in [place]  SDOH Interventions      Food Insecurity Interventions Intervention Not Indicated Intervention Not Indicated -- --  Housing Interventions Intervention Not Indicated Intervention Not Indicated -- --  Transportation Interventions Intervention Not Indicated Intervention Not Indicated Intervention Not Indicated --  Utilities Interventions -- Intervention Not Indicated -- --  Alcohol Usage Interventions -- Intervention Not Indicated (Score <7) -- --  Depression Interventions/Treatment  -- -- -- PHQ2-9 Score <4 Follow-up Not Indicated  Financial Strain Interventions -- Intervention Not Indicated Other (Comment)  [PAP] --  Physical Activity Interventions -- Intervention Not Indicated -- --  Stress Interventions -- Intervention Not Indicated -- --  Social Connections Interventions -- Intervention Not Indicated -- --       Medication Assistance: None required.  Patient affirms current coverage meets needs.  Medication Access: Within the past 30 days, how often has patient missed a dose of medication? No Is a pillbox or other method used to improve adherence? Yes  Factors that may affect medication adherence? nonadherence to medications and adverse effects of medications Are meds synced by current pharmacy? No  Are meds delivered by current pharmacy? No  Does patient experience delays in picking up medications due to transportation concerns? No   [facility]  Services Reviewed: Is patient disadvantaged  to use [facility]?: No  Current Rx insurance plan: HTA Name and location of Current pharmacy:  [place] [address] [place] [address] [place] [address] Phone: [phone] Fax: [phone]  [facility] services reviewed with patient today?: No  Patient requests to transfer care to [facility]?: No  Reason patient declined to change pharmacies: Not mentioned at this visit  Compliance/Adherence/Medication fill history: Care Gaps: Zoster Vaccines Influenza Vaccine  Star-Rating Drugs: Losartan 100 mg last filled on 09/24/2022 for a 90-Day supply with [place] Rosuvastatin 5 mg last filled on 09/20/2022 for a 90-Day supply with [place]  Assessment/Plan  Hypertension (BP goal <130/80) -Controlled -Current treatment: Diltiazem CD 240 mg daily  Losartan 100 mg daily  -Medications previously tried: Amlodipine, Metoprolol, Propanolol, Verapamil   -Current home readings: 117-120s/60s.  -Current dietary habits: Does add salt in her cooking. -Denies hypotensive/hypertensive symptoms -Recommended to continue current medication  Hyperlipidemia: (LDL goal < 70) -Controlled -Current treatment: Rosuvastatin 5 mg daily  -Medications previously tried: NA  -Recommended to continue current medication  Diabetes (A1c goal <7%) -Uncontrolled -Current medications: None  -Medications previously tried: Glipizide (Constipation), Jardiance (Stomach ache, diarrhea, skin burning), Rybelsus( Nausea, abdominal pain)  -Hesitant about injectable medications.  -Current home glucose readings fasting glucose:  Post-Prandial: 190  -Denies hypoglycemic/hyperglycemic symptoms -Current meal patterns: Trying to eat healthier, smaller portions overall.  snacks: Activia Yogurt drinks: Decaf coffee or herbal tea most mornings. Drinks 6-8 bottles of water.  -Current exercise: chair exercises.  -START Glimepiride 1 mg daily with breakfast.    Constipation (Goal: Improve symptoms) -Uncontrolled -Current treatment  Docusate 100 mg as needed Takes 1-2 times weekly  Lactulose as needed  -Medications previously tried: Office manager (racing heart)  [name], PharmD, [place], CPP  Clinical Pharmacist Practitioner  [facility] [phone]

## 2022-11-23 ENCOUNTER — Other Ambulatory Visit: Payer: Self-pay | Admitting: Infectious Diseases

## 2022-11-23 DIAGNOSIS — Z1231 Encounter for screening mammogram for malignant neoplasm of breast: Secondary | ICD-10-CM

## 2023-01-05 ENCOUNTER — Ambulatory Visit
Admission: RE | Admit: 2023-01-05 | Discharge: 2023-01-05 | Disposition: A | Discharge status: Home / Self Care | Payer: Medicare Other | Source: Ambulatory Visit | Attending: Infectious Diseases | Admitting: Infectious Diseases

## 2023-01-05 DIAGNOSIS — Z1231 Encounter for screening mammogram for malignant neoplasm of breast: Secondary | ICD-10-CM | POA: Insufficient documentation

## 2023-09-03 ENCOUNTER — Emergency Department
Admission: EM | Admit: 2023-09-03 | Discharge: 2023-09-03 | Disposition: A | Discharge status: Home / Self Care | Payer: Medicare Other | Attending: Emergency Medicine | Admitting: Emergency Medicine

## 2023-09-03 ENCOUNTER — Other Ambulatory Visit: Payer: Self-pay

## 2023-09-03 ENCOUNTER — Emergency Department: Payer: Medicare Other

## 2023-09-03 ENCOUNTER — Encounter: Payer: Self-pay | Admitting: Pharmacy Technician

## 2023-09-03 DIAGNOSIS — R0789 Other chest pain: Secondary | ICD-10-CM | POA: Insufficient documentation

## 2023-09-03 DIAGNOSIS — R42 Dizziness and giddiness: Secondary | ICD-10-CM | POA: Insufficient documentation

## 2023-09-03 DIAGNOSIS — I10 Essential (primary) hypertension: Secondary | ICD-10-CM | POA: Diagnosis not present

## 2023-09-03 DIAGNOSIS — R002 Palpitations: Secondary | ICD-10-CM | POA: Insufficient documentation

## 2023-09-03 DIAGNOSIS — E119 Type 2 diabetes mellitus without complications: Secondary | ICD-10-CM | POA: Diagnosis not present

## 2023-09-03 LAB — CBC
HCT: 39.8 % (ref 36.0–46.0)
Hemoglobin: 13.5 g/dL (ref 12.0–15.0)
MCH: 32.1 pg (ref 26.0–34.0)
MCHC: 33.9 g/dL (ref 30.0–36.0)
MCV: 94.8 fL (ref 80.0–100.0)
Platelets: 381 10*3/uL (ref 150–400)
RBC: 4.2 MIL/uL (ref 3.87–5.11)
RDW: 11.9 % (ref 11.5–15.5)
WBC: 11 10*3/uL — ABNORMAL HIGH (ref 4.0–10.5)
nRBC: 0 % (ref 0.0–0.2)

## 2023-09-03 LAB — BASIC METABOLIC PANEL
Anion gap: 7 (ref 5–15)
BUN: 16 mg/dL (ref 8–23)
CO2: 26 mmol/L (ref 22–32)
Calcium: 9.1 mg/dL (ref 8.9–10.3)
Chloride: 103 mmol/L (ref 98–111)
Creatinine, Ser: 0.85 mg/dL (ref 0.44–1.00)
GFR, Estimated: >60 mL/min (ref >60)
Glucose, Bld: 349 mg/dL — ABNORMAL HIGH (ref 70–99)
Potassium: 3.7 mmol/L (ref 3.5–5.1)
Sodium: 136 mmol/L (ref 135–145)

## 2023-09-03 LAB — TROPONIN I (HIGH SENSITIVITY)
Troponin I (High Sensitivity): 4 ng/L (ref <18)
Troponin I (High Sensitivity): 5 ng/L (ref <18)

## 2023-09-03 NOTE — ED Notes (Signed)
Pt verbalizes understanding of discharge instructions.

## 2023-09-03 NOTE — ED Provider Notes (Signed)
[facility] Provider Note    Event Date/Time   First MD Initiated Contact with Patient 09/03/23 1954     (approximate)   History   Chief Complaint Chest Pain, Dizziness, and Palpitations   HPI  Molly Potter is a 67 y.o. female with past medical history of hypertension, hyperlipidemia, diabetes who presents to the ED complaining of chest pain and palpitations.  Patient reports that for the past week she has been having intermittent dizziness and lightheadedness, particularly with changes in position such as when she bends over.  She has occasionally felt like her heart is racing or skipping beats, but denies any palpitations currently.  She became more concerned when she started to have tightness in her chest earlier today.  She states this has since resolved and she has not had any fever, cough, or difficulty breathing.  She denies any cardiac history.     Physical Exam   Triage Vital Signs: ED Triage Vitals  Encounter Vitals Group     BP 09/03/23 1553 137/68     Systolic BP Percentile --      Diastolic BP Percentile --      Pulse Rate 09/03/23 1553 86     Resp 09/03/23 1553 17     Temp 09/03/23 1553 99 F (37.2 C)     Temp Source 09/03/23 1553 Oral     SpO2 09/03/23 1553 95 %     Weight --      Height --      Head Circumference --      Peak Flow --      Pain Score 09/03/23 1555 4     Pain Loc --      Pain Education --      Exclude from Growth Chart --     Most recent vital signs: Vitals:   09/03/23 1553 09/03/23 2040  BP: 137/68 131/74  Pulse: 86 64  Resp: 17 16  Temp: 99 F (37.2 C) 97.9 F (36.6 C)  SpO2: 95% 99%    Constitutional: Alert and oriented. Eyes: Conjunctivae are normal. Head: Atraumatic. Nose: No congestion/rhinnorhea. Mouth/Throat: Mucous membranes are moist.  Cardiovascular: Normal rate, regular rhythm. Grossly normal heart sounds.  2+ radial pulses bilaterally. Respiratory: Normal respiratory effort.  No  retractions. Lungs CTAB. Gastrointestinal: Soft and nontender. No distention. Musculoskeletal: No lower extremity tenderness nor edema.  Neurologic:  Normal speech and language. No gross focal neurologic deficits are appreciated.    ED Results / Procedures / Treatments   Labs (all labs ordered are listed, but only abnormal results are displayed) Labs Reviewed  BASIC METABOLIC PANEL - Abnormal; Notable for the following components:      Result Value   Glucose, Bld 349 (*)    All other components within normal limits  CBC - Abnormal; Notable for the following components:   WBC 11.0 (*)    All other components within normal limits  TROPONIN I (HIGH SENSITIVITY)  TROPONIN I (HIGH SENSITIVITY)     EKG  ED ECG REPORT I, Chesley Noon, the attending physician, personally viewed and interpreted this ECG.   Date: 09/03/2023  EKG Time: 15:57  Rate: 87  Rhythm: normal sinus rhythm  Axis: Normal  Intervals:none  ST&T Change: None  RADIOLOGY Chest x-ray reviewed and interpreted by me with no infiltrate, edema, or effusion.  PROCEDURES:  Critical Care performed: No  Procedures   MEDICATIONS ORDERED IN ED: Medications - No data to display   IMPRESSION /  MDM / ASSESSMENT AND PLAN / ED COURSE  I reviewed the triage vital signs and the nursing notes.                              67 y.o. female with past medical history of hypertension, hyperlipidemia, diabetes who presents to the ED complaining of lightheadedness and palpitations for the past week with episode of chest pain earlier today.  Patient's presentation is most consistent with acute presentation with potential threat to life or bodily function.  Differential diagnosis includes, but is not limited to, arrhythmia, ACS, orthostatic hypotension, anemia, electrolyte abnormality, AKI.  Patient nontoxic-appearing and in no acute distress, vital signs are unremarkable.  EKG shows no evidence of arrhythmia or ischemia and  initial troponin within normal limits.  We will observe on cardiac monitor and check second set troponin.  Given resolution of chest pain, I doubt PE or dissection.  Chest x-ray is unremarkable and remainder of labs are reassuring with no significant anemia, leukocytosis, electrolyte abnormality, or AKI.  No events noted on cardiac monitor, repeat troponin within normal limits.  Patient appropriate for discharge home with PCP and cardiology follow-up, referral provided for cardiology.  She was counseled to return to the ED for new or worsening symptoms.  Patient agrees with plan.      FINAL CLINICAL IMPRESSION(S) / ED DIAGNOSES   Final diagnoses:  Dizziness  Palpitations     Rx / DC Orders   ED Discharge Orders          Ordered    Ambulatory referral to Cardiology        09/03/23 2154             Note:  This document was prepared using Dragon voice recognition software and may include unintentional dictation errors.   Chesley Noon, MD 09/03/23 2154

## 2023-09-03 NOTE — ED Triage Notes (Signed)
Pt here with reports of dizziness, chest pain, indigestion, palpitations since yesterday. Endorses not feeling well all week.

## 2023-11-15 ENCOUNTER — Ambulatory Visit: Payer: PPO | Admitting: Cardiology

## 2024-01-16 NOTE — Progress Notes (Unsigned)
 Cardiology Office Note  Date:  01/17/2024   ID:  Molly Potter, DOB [birth year], MRN [id number]  PCP:  Benuel Brazier, MD   Chief Complaint  Patient presents with   New Patient (Initial Visit)    Referred by Dr. Cleora Daft for dizziness & palpitations.       HPI:  Molly Potter is a 68 year old woman with past medical history of Diabetes type 2  hypertension Hyperlipidemia Who presents by referral from Dr. Cleora Daft for dizziness, palpitations  Seen in the emergency room September 03, 2023 for chest pain dizziness palpitations particularly with changes in position such as when she bends over. She has occasionally felt like her heart is racing or skipping beats, but denies any palpitations currently. She became more concerned when she started to have tightness in her chest earlier today. She states this has since resolved  Workup in the ER negative  In follow-up she reports she has not had any further symptoms over the past several months Feels her blood pressure was running high at the time when she was in the emergency room because by eating too much bacon and sausage When she changed her diet, symptoms have improved Reports blood pressure well-controlled  Orthostatics in the office today blood pressure ranging 104 up to 118, actually went up higher with standing No significant change in heart rate  EKG personally reviewed by myself on todays visit EKG Interpretation Date/Time:  Tuesday Jan 17 2024 14:23:30 EDT Ventricular Rate:  63 PR Interval:  194 QRS Duration:  86 QT Interval:  432 QTC Calculation: 442 R Axis:   2  Text Interpretation: Normal sinus rhythm Normal ECG When compared with ECG of 03-Sep-2023 15:57, No significant change was found Confirmed by Belva Boyden [phone]) on 01/17/2024 3:02:01 PM    PMH:   has a past medical history of Arthritis, Diabetes mellitus without complication (HCC), Fatigue, GERD (gastroesophageal reflux disease), History of eczema,  Hypertension, Hyperthyroidism, Joint pain, and PONV (postoperative nausea and vomiting).  PSH:    Past Surgical History:  Procedure Laterality Date   ABDOMINAL HYSTERECTOMY  01/2021   CYSTOCELE REPAIR N/A 02/02/2021   Procedure: ANTERIOR REPAIR (CYSTOCELE), MODIFIED MCCALLS CULDOPLASTY;  Surgeon: Prescilla Brod, MD;  Location: [facility];  Service: Gynecology;  Laterality: N/A;   CYSTOSCOPY N/A 02/02/2021   Procedure: CYSTOSCOPY;  Surgeon: Prescilla Brod, MD;  Location: [facility];  Service: Gynecology;  Laterality: N/A;   LAPAROSCOPIC ASSISTED VAGINAL HYSTERECTOMY N/A 02/02/2021   Procedure: LAPAROSCOPIC ASSISTED VAGINAL HYSTERECTOMY;  Surgeon: Prescilla Brod, MD;  Location: [facility];  Service: Gynecology;  Laterality: N/A;   LAPAROSCOPIC BILATERAL SALPINGECTOMY Bilateral 02/02/2021   Procedure: LAPAROSCOPIC BILATERAL SALPINGECTOMY AND OOPHERECTOMY;  Surgeon: Prescilla Brod, MD;  Location: [facility];  Service: Gynecology;  Laterality: Bilateral;   ROTATOR CUFF REPAIR Right    TUBAL LIGATION      Current Outpatient Medications  Medication Sig Dispense Refill   acetaminophen  (TYLENOL ) 500 MG tablet Take 500 mg by mouth 2 (two) times daily.     aspirin EC 81 MG tablet Take 81 mg by mouth daily.     blood glucose meter kit and supplies Dispense based on patient and insurance preference. To check fasting blood sugar once daily. E1.22 1 each 0   cholecalciferol  (VITAMIN D3) 25 MCG (1000 UNIT) tablet Take 1,000 Units by mouth daily.     cyclobenzaprine  (FLEXERIL ) 5 MG tablet TAKE 1 TABLET BY MOUTH AT BEDTIME 90 tablet 0   diclofenac  Sodium (VOLTAREN ) 1 %  GEL Apply 2 g topically daily as needed (pain).     diltiazem  (CARDIZEM  CD) 240 MG 24 hr capsule Take 1 capsule (240 mg total) by mouth daily. 90 capsule 3   docusate sodium  (COLACE) 100 MG capsule Take 1 capsule (100 mg total) by mouth 2 (two) times daily. To keep stools soft (Patient taking differently: Take 100 mg by mouth daily as needed.  To keep stools soft) 30 capsule 0   gabapentin  (NEURONTIN ) 100 MG capsule Take 100 mg by mouth at bedtime.     levocetirizine (XYZAL) 5 MG tablet Take 5 mg by mouth every evening.     losartan  (COZAAR ) 100 MG tablet Take 1 tablet (100 mg total) by mouth daily. 90 tablet 1   meloxicam (MOBIC) 7.5 MG tablet Take 7.5 mg by mouth daily.     montelukast  (SINGULAIR ) 10 MG tablet Take 1 tablet (10 mg total) by mouth at bedtime. 90 tablet 3   Multiple Vitamin (MULTIVITAMIN) tablet Take 1 tablet by mouth daily. Women's 50+     Polyethyl Glycol-Propyl Glycol (SYSTANE OP) Place 1 drop into both eyes daily as needed (dry/irritated eyes).     rosuvastatin  (CRESTOR ) 5 MG tablet Take 1 tablet (5 mg total) by mouth daily. 90 tablet 1   glucose blood (CONTOUR NEXT TEST) test strip To check BG BID (Patient not taking: Reported on 01/17/2024) 200 each 3   lactulose  (CHRONULAC ) 10 GM/15ML solution Take 15 mLs (10 g total) by mouth 2 (two) times daily as needed for mild constipation. (Patient not taking: Reported on 01/17/2024) 236 mL 0   Lancets (ONETOUCH DELICA PLUS LANCET33G) MISC USE TO TEST BLOOD SUGAR ONCE DAILY (Patient not taking: Reported on 01/17/2024) 100 each 0   No current facility-administered medications for this visit.    Allergies:   Effexor  [venlafaxine ], Hylan g-f 20, Diazepam, Jardiance  [empagliflozin ], Tramadol, and Methylprednisolone    Social History:  The patient  reports that she has never smoked. She has never used smokeless tobacco. She reports that she does not currently use alcohol . She reports that she does not use drugs.   Family History:   family history includes CAD in her mother; Diabetes in her maternal grandmother and paternal grandfather; Hypertension in her father; Lung cancer in her father; Throat cancer in her mother.    Review of Systems: Review of Systems  Constitutional: Negative.   HENT: Negative.    Respiratory: Negative.    Cardiovascular: Negative.   Gastrointestinal:  Negative.   Musculoskeletal: Negative.   Neurological: Negative.   Psychiatric/Behavioral: Negative.    All other systems reviewed and are negative.  PHYSICAL EXAM: VS:  BP 120/70 (BP Location: Right Arm, Patient Position: Sitting, Cuff Size: Normal)   Pulse 63   Ht 5\' 5"  (1.651 m)   Wt 174 lb 4 oz (79 kg)   SpO2 97%   BMI 29.00 kg/m  , BMI Body mass index is 29 kg/m. Constitutional:  oriented to person, place, and time. No distress.  HENT:  Head: Grossly normal Eyes:  no discharge. No scleral icterus.  Neck: No JVD, no carotid bruits  Cardiovascular: Regular rate and rhythm, no murmurs appreciated Pulmonary/Chest: Clear to auscultation bilaterally, no wheezes or rales Abdominal: Soft.  no distension.  no tenderness.  Musculoskeletal: Normal range of motion Neurological:  normal muscle tone. Coordination normal. No atrophy Skin: Skin warm and dry Psychiatric: normal affect, pleasant  Recent Labs: 09/03/2023: BUN 16; Creatinine, Ser 0.85; Hemoglobin 13.5; Platelets 381; Potassium 3.7; Sodium 136  Lipid Panel Lab Results  Component Value Date   CHOL 124 06/24/2022   HDL 43 06/24/2022   LDLCALC 61 06/24/2022   TRIG 111 06/24/2022      Wt Readings from Last 3 Encounters:  01/17/24 174 lb 4 oz (79 kg)  09/03/23 177 lb (80.3 kg)  08/10/22 179 lb (81.2 kg)     ASSESSMENT AND PLAN:  Problem List Items Addressed This Visit     Diabetes mellitus (HCC) - Primary   Relevant Medications   aspirin EC 81 MG tablet   Other Visit Diagnoses       Essential hypertension       Relevant Medications   aspirin EC 81 MG tablet     Dizziness       Relevant Orders   EKG 12-Lead (Completed)     Chest pain of uncertain etiology       Relevant Orders   EKG 12-Lead (Completed)     Palpitations       Relevant Orders   EKG 12-Lead (Completed)      Chest pain Denies any chest pain symptoms concerning for angina States he is active his baseline, does not think she needs  additional cardiac workup EKG essentially normal, cardiac workup in the emergency room unrevealing  Hypertension Reports blood pressure well-controlled on diltiazem  ER 240 mg daily Feels that after she changed her diet from high salt breakfast that her symptoms have resolved  Diabetes type 2 No recent A1c available for review, previously running up to 9 Managed by primary care Low carbohydrate diet recommended  Signed, Juanda Noon, M.D., Ph.D. [facility] [place], [place] [phone]

## 2024-01-17 ENCOUNTER — Encounter: Payer: Self-pay | Admitting: Cardiovascular Disease

## 2024-01-17 ENCOUNTER — Ambulatory Visit: Payer: PPO | Attending: Cardiovascular Disease | Admitting: Cardiovascular Disease

## 2024-01-17 VITALS — BP 120/70 | HR 63 | Ht 65.0 in | Wt 174.2 lb

## 2024-01-17 DIAGNOSIS — R002 Palpitations: Secondary | ICD-10-CM

## 2024-01-17 DIAGNOSIS — I10 Essential (primary) hypertension: Secondary | ICD-10-CM

## 2024-01-17 DIAGNOSIS — R079 Chest pain, unspecified: Secondary | ICD-10-CM | POA: Diagnosis not present

## 2024-01-17 DIAGNOSIS — R42 Dizziness and giddiness: Secondary | ICD-10-CM

## 2024-01-17 DIAGNOSIS — E1159 Type 2 diabetes mellitus with other circulatory complications: Secondary | ICD-10-CM | POA: Diagnosis not present

## 2024-01-17 NOTE — Patient Instructions (Signed)

## 2024-03-06 ENCOUNTER — Other Ambulatory Visit: Payer: Self-pay | Admitting: Internal Medicine

## 2024-03-06 DIAGNOSIS — Z1231 Encounter for screening mammogram for malignant neoplasm of breast: Secondary | ICD-10-CM

## 2024-03-06 DIAGNOSIS — Z780 Asymptomatic menopausal state: Secondary | ICD-10-CM

## 2024-05-22 ENCOUNTER — Other Ambulatory Visit: Payer: Self-pay | Admitting: Physical Medicine & Rehabilitation

## 2024-05-22 DIAGNOSIS — M5416 Radiculopathy, lumbar region: Secondary | ICD-10-CM

## 2024-09-22 ENCOUNTER — Other Ambulatory Visit (HOSPITAL_COMMUNITY): Payer: Self-pay
